# Patient Record
Sex: Male | Born: 1950 | Race: Black or African American | Hispanic: No | Smoking: Former smoker
Health system: Southern US, Community
[De-identification: ages and names within clinical notes are randomized; demographics above are authoritative.]

## PROBLEM LIST (undated history)

## (undated) DIAGNOSIS — I2510 Atherosclerotic heart disease of native coronary artery without angina pectoris: Secondary | ICD-10-CM

## (undated) DIAGNOSIS — E785 Hyperlipidemia, unspecified: Secondary | ICD-10-CM

## (undated) DIAGNOSIS — R21 Rash and other nonspecific skin eruption: Secondary | ICD-10-CM

## (undated) DIAGNOSIS — C61 Malignant neoplasm of prostate: Secondary | ICD-10-CM

## (undated) DIAGNOSIS — I219 Acute myocardial infarction, unspecified: Secondary | ICD-10-CM

## (undated) DIAGNOSIS — I255 Ischemic cardiomyopathy: Secondary | ICD-10-CM

## (undated) DIAGNOSIS — R350 Frequency of micturition: Secondary | ICD-10-CM

## (undated) DIAGNOSIS — K219 Gastro-esophageal reflux disease without esophagitis: Secondary | ICD-10-CM

## (undated) DIAGNOSIS — N529 Male erectile dysfunction, unspecified: Secondary | ICD-10-CM

## (undated) DIAGNOSIS — Z973 Presence of spectacles and contact lenses: Secondary | ICD-10-CM

## (undated) DIAGNOSIS — I10 Essential (primary) hypertension: Secondary | ICD-10-CM

## (undated) DIAGNOSIS — J189 Pneumonia, unspecified organism: Secondary | ICD-10-CM

## (undated) HISTORY — PX: NO PAST SURGERIES: SHX2092

## (undated) HISTORY — DX: Ischemic cardiomyopathy: I25.5

---

## 2015-03-07 ENCOUNTER — Other Ambulatory Visit: Payer: Self-pay | Admitting: Urology

## 2015-03-07 ENCOUNTER — Ambulatory Visit (INDEPENDENT_AMBULATORY_CARE_PROVIDER_SITE_OTHER): Payer: Managed Care, Other (non HMO) | Admitting: Urology

## 2015-03-07 DIAGNOSIS — N400 Enlarged prostate without lower urinary tract symptoms: Secondary | ICD-10-CM

## 2015-03-07 DIAGNOSIS — R972 Elevated prostate specific antigen [PSA]: Secondary | ICD-10-CM

## 2015-03-07 DIAGNOSIS — N5201 Erectile dysfunction due to arterial insufficiency: Secondary | ICD-10-CM

## 2015-04-21 ENCOUNTER — Other Ambulatory Visit: Payer: Self-pay | Admitting: Urology

## 2015-04-21 DIAGNOSIS — R972 Elevated prostate specific antigen [PSA]: Secondary | ICD-10-CM

## 2015-04-25 ENCOUNTER — Encounter (HOSPITAL_COMMUNITY): Payer: Self-pay

## 2015-04-25 ENCOUNTER — Ambulatory Visit (HOSPITAL_COMMUNITY)
Admission: RE | Admit: 2015-04-25 | Discharge: 2015-04-25 | Disposition: A | Discharge status: Home / Self Care | Payer: 59 | Source: Ambulatory Visit | Attending: Urology | Admitting: Urology

## 2015-04-25 DIAGNOSIS — R972 Elevated prostate specific antigen [PSA]: Secondary | ICD-10-CM | POA: Diagnosis present

## 2015-04-25 DIAGNOSIS — C61 Malignant neoplasm of prostate: Secondary | ICD-10-CM | POA: Insufficient documentation

## 2015-04-25 HISTORY — DX: Essential (primary) hypertension: I10

## 2015-04-25 MED ORDER — CEFTRIAXONE SODIUM 1 G IJ SOLR
1.0000 g | Freq: Once | INTRAMUSCULAR | Status: AC
Start: 2015-04-25 — End: 2015-04-25
  Administered 2015-04-25: 1 g via INTRAMUSCULAR

## 2015-04-25 MED ORDER — CEFTRIAXONE SODIUM 1 G IJ SOLR
INTRAMUSCULAR | Status: AC
  Administered 2015-04-25: 1 g via INTRAMUSCULAR
  Filled 2015-04-25: qty 10

## 2015-04-25 MED ORDER — LIDOCAINE HCL (PF) 2 % IJ SOLN
INTRAMUSCULAR | Status: AC
  Administered 2015-04-25: 10 mL
  Filled 2015-04-25: qty 10

## 2015-04-25 MED ORDER — LIDOCAINE HCL (PF) 1 % IJ SOLN
INTRAMUSCULAR | Status: AC
  Administered 2015-04-25: 2 mL
  Filled 2015-04-25: qty 5

## 2015-04-25 MED ORDER — LIDOCAINE HCL (PF) 2 % IJ SOLN
10.0000 mL | Freq: Once | INTRAMUSCULAR | Status: AC
  Administered 2015-04-25: 10 mL

## 2015-04-25 NOTE — Discharge Instructions (Signed)
Transrectal Ultrasound-Guided Biopsy °A transrectal ultrasound-guided biopsy is a procedure to remove samples of tissue from your prostate using ultrasound images to guide the procedure. The procedure is usually done to evaluate the prostate gland of men who have an elevated prostate-specific antigen (PSA). PSA is a blood test to screen for prostate cancer. The biopsy samples are taken to check for prostate cancer.  °LET YOUR HEALTH CARE PROVIDER KNOW ABOUT: °· Any allergies you have. °· All medicines you are taking, including vitamins, herbs, eye drops, creams, and over-the-counter medicines. °· Previous problems you or members of your family have had with the use of anesthetics. °· Any blood disorders you have. °· Previous surgeries you have had. °· Medical conditions you have. °RISKS AND COMPLICATIONS °Generally, this is a safe procedure. However, as with any procedure, problems can occur. Possible problems include: °· Infection of your prostate. °· Bleeding from your rectum or blood in your urine. °· Difficulty urinating. °· Nerve damage (this is usually temporary). °· Damage to surrounding structures such as blood vessels, organs, and muscles, which would require other procedures. °BEFORE THE PROCEDURE °· Do not eat or drink anything after midnight on the night before the procedure or as directed by your health care provider. °· Take medicines only as directed by your health care provider. °· Your health care provider may have you stop taking certain medicines 5-7 days before the procedure. °· You will be given an enema before the procedure. During an enema, a liquid is injected into your rectum to clear out waste. °· You may have lab tests the day of your procedure.   °· Plan to have someone take you home after the procedure. °PROCEDURE  °· You will be given medicine to help you relax (sedative) before the procedure. An IV tube will be inserted into one of your veins and used to give fluids and  medicine. °· You will be given antibiotic medicine to reduce the risk of an infection. °· You will be placed on your side for the procedure. °· A probe with lubricated gel will be placed into your rectum, and images will be taken of your prostate and surrounding structures. °· Numbing medicine will be injected into the prostate before the biopsy samples are taken. °· A biopsy needle will then be inserted and guided to your prostate with the use of the ultrasound images. °· Samples of prostate tissue will be taken, and the needle will then be removed. °· The biopsy samples will be sent to a lab to be analyzed. Results are usually back in 2-3 days. °AFTER THE PROCEDURE °· You will be taken to a recovery area where you will be monitored. °· You may have some discomfort in the rectal area. You will be given pain medicines to control this. °· You may be allowed to go home the same day, or you may need to stay in the hospital overnight. °Document Released: 01/28/2014 Document Reviewed: 05/02/2013 °ExitCare® Patient Information ©2015 [employer]. This information is not intended to replace advice given to you by your health care provider. Make sure you discuss any questions you have with your health care provider. ° °

## 2015-08-29 ENCOUNTER — Ambulatory Visit (INDEPENDENT_AMBULATORY_CARE_PROVIDER_SITE_OTHER): Payer: 59 | Admitting: Urology

## 2015-08-29 DIAGNOSIS — C61 Malignant neoplasm of prostate: Secondary | ICD-10-CM | POA: Diagnosis not present

## 2015-09-24 ENCOUNTER — Other Ambulatory Visit: Payer: Self-pay | Admitting: Urology

## 2015-09-24 ENCOUNTER — Other Ambulatory Visit: Payer: Self-pay | Admitting: Radiation Oncology

## 2015-09-24 ENCOUNTER — Telehealth: Payer: Self-pay | Admitting: *Deleted

## 2015-09-24 DIAGNOSIS — C61 Malignant neoplasm of prostate: Secondary | ICD-10-CM | POA: Insufficient documentation

## 2015-09-24 NOTE — Telephone Encounter (Signed)
Called patient to inform of implant date, no answer will call later. 

## 2015-09-24 NOTE — Telephone Encounter (Signed)
Called patient to inform of pre-seed appt. On 10-10-15 @ 1:45 pm with Dr. Tammi Klippel, spoke with patient and he is aware of this appt.

## 2015-10-09 ENCOUNTER — Telehealth: Payer: Self-pay | Admitting: *Deleted

## 2015-10-09 NOTE — Telephone Encounter (Signed)
Called patient to remind of appts., no answer will call later.

## 2015-10-10 ENCOUNTER — Ambulatory Visit
Admission: RE | Admit: 2015-10-10 | Discharge: 2015-10-10 | Disposition: A | Discharge status: Home / Self Care | Payer: BLUE CROSS/BLUE SHIELD | Source: Ambulatory Visit | Attending: Radiation Oncology | Admitting: Radiation Oncology

## 2015-10-10 DIAGNOSIS — Z510 Encounter for antineoplastic radiation therapy: Secondary | ICD-10-CM | POA: Diagnosis not present

## 2015-10-10 DIAGNOSIS — C61 Malignant neoplasm of prostate: Secondary | ICD-10-CM | POA: Diagnosis present

## 2015-10-10 NOTE — Progress Notes (Signed)
  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 Malignant neoplasm of prostate ([place]) [place]:  The patient presented today for evaluation for possible prostate seed implant. He was brought to the radiation planning suite and placed supine on the CT couch. A 3-dimensional image study set was obtained in upload to the planning computer. There, on each axial slice, I contoured the prostate gland. Then, using three-dimensional radiation planning tools I reconstructed the prostate in view of the structures from the transperineal needle pathway to assess for possible pubic arch interference. In doing so, I did not appreciate any pubic arch interference. Also, the patient's prostate volume was estimated based on the drawn structure. The volume was 73 cc.  Given the pubic arch appearance and prostate volume, patient remains a good candidate to proceed with prostate seed implant. Today, he freely provided informed written consent to proceed.    PLAN: The patient will undergo prostate seed implant.   ________________________________  Sheral Apley. Tammi Klippel, M.D.

## 2015-10-14 ENCOUNTER — Ambulatory Visit (HOSPITAL_BASED_OUTPATIENT_CLINIC_OR_DEPARTMENT_OTHER)
Admission: RE | Admit: 2015-10-14 | Discharge: 2015-10-14 | Disposition: A | Discharge status: Home / Self Care | Payer: BLUE CROSS/BLUE SHIELD | Source: Ambulatory Visit | Attending: Urology | Admitting: Urology

## 2015-10-14 ENCOUNTER — Encounter (HOSPITAL_BASED_OUTPATIENT_CLINIC_OR_DEPARTMENT_OTHER)
Admission: RE | Admit: 2015-10-14 | Discharge: 2015-10-14 | Disposition: A | Discharge status: Home / Self Care | Payer: BLUE CROSS/BLUE SHIELD | Source: Ambulatory Visit | Attending: Urology | Admitting: Urology

## 2015-10-14 DIAGNOSIS — C61 Malignant neoplasm of prostate: Secondary | ICD-10-CM | POA: Diagnosis not present

## 2015-10-16 ENCOUNTER — Emergency Department (HOSPITAL_COMMUNITY): Payer: BLUE CROSS/BLUE SHIELD

## 2015-10-16 ENCOUNTER — Emergency Department (HOSPITAL_COMMUNITY)
Admission: EM | Admit: 2015-10-16 | Discharge: 2015-10-16 | Disposition: A | Discharge status: Home / Self Care | Payer: BLUE CROSS/BLUE SHIELD | Attending: Emergency Medicine | Admitting: Emergency Medicine

## 2015-10-16 ENCOUNTER — Encounter (HOSPITAL_COMMUNITY): Payer: Self-pay

## 2015-10-16 DIAGNOSIS — K610 Anal abscess: Secondary | ICD-10-CM | POA: Insufficient documentation

## 2015-10-16 DIAGNOSIS — I10 Essential (primary) hypertension: Secondary | ICD-10-CM | POA: Insufficient documentation

## 2015-10-16 DIAGNOSIS — K5900 Constipation, unspecified: Secondary | ICD-10-CM | POA: Diagnosis present

## 2015-10-16 DIAGNOSIS — Z87891 Personal history of nicotine dependence: Secondary | ICD-10-CM | POA: Diagnosis not present

## 2015-10-16 MED ORDER — DOCUSATE SODIUM 100 MG PO CAPS
100.0000 mg | ORAL_CAPSULE | Freq: Two times a day (BID) | ORAL | Status: DC
Start: 2015-10-16 — End: 2016-02-09

## 2015-10-16 MED ORDER — LIDOCAINE-EPINEPHRINE (PF) 2 %-1:200000 IJ SOLN
10.0000 mL | Freq: Once | INTRAMUSCULAR | Status: AC
  Administered 2015-10-16: 10 mL
  Filled 2015-10-16: qty 20

## 2015-10-16 MED ORDER — OXYCODONE-ACETAMINOPHEN 5-325 MG PO TABS
1.0000 | ORAL_TABLET | Freq: Once | ORAL | Status: DC
  Filled 2015-10-16: qty 1

## 2015-10-16 MED ORDER — SULFAMETHOXAZOLE-TRIMETHOPRIM 800-160 MG PO TABS: 1.0000 | ORAL_TABLET | Freq: Two times a day (BID) | ORAL | Status: AC

## 2015-10-16 MED ORDER — POVIDONE-IODINE 10 % EX SOLN
CUTANEOUS | Status: AC
  Filled 2015-10-16: qty 118

## 2015-10-16 MED ORDER — OXYCODONE-ACETAMINOPHEN 5-325 MG PO TABS: 1.0000 | ORAL_TABLET | Freq: Four times a day (QID) | ORAL | Status: DC | PRN

## 2015-10-16 NOTE — ED Notes (Signed)
Dressing applied to anal area and cleaned with ns

## 2015-10-16 NOTE — ED Notes (Signed)
Patient given percocet package to go. Stated he didn't want one now due to him driving home

## 2015-10-16 NOTE — ED Provider Notes (Signed)
CSN: [id number]     Arrival date & time 10/16/15  0117 History   First MD Initiated Contact with Patient 10/16/15 0144     Chief Complaint  Patient presents with  . Constipated      (Consider location/radiation/quality/duration/timing/severity/associated sxs/prior Treatment) HPI  This is a 65 year old male with a history of hypertension who presents with rectal pain and constipation. Patient reports one-day history of worsening pressure around his rectum and feeling like he needs to have a bowel movement. He has taken a laxative without any success. Patient reports last normal bowel movement was on Tuesday. Denies any abdominal pain or vomiting. Denies any fevers or additional symptoms.  Past Medical History  Diagnosis Date  . Hypertension    History reviewed. No pertinent past surgical history. No family history on file. Social History  Substance Use Topics  . Smoking status: Former Research scientist (life sciences)  . Smokeless tobacco: None  . Alcohol Use: Yes    Review of Systems  Constitutional: Negative.  Negative for fever.  Respiratory: Negative.  Negative for shortness of breath.   Cardiovascular: Negative.  Negative for chest pain.  Gastrointestinal: Positive for constipation. Negative for nausea, vomiting, abdominal pain and diarrhea.  Genitourinary: Negative.   All other systems reviewed and are negative.     Allergies  Review of patient's allergies indicates no known allergies.  Home Medications   Prior to Admission medications   Medication Sig Start Date End Date Taking? Authorizing Provider  docusate sodium (COLACE) 100 MG capsule Take 1 capsule (100 mg total) by mouth every 12 (twelve) hours. 10/16/15   Merryl Hacker, MD  oxyCODONE-acetaminophen (PERCOCET/ROXICET) 5-325 MG tablet Take 1-2 tablets by mouth every 6 (six) hours as needed for severe pain. 10/16/15   Merryl Hacker, MD  sulfamethoxazole-trimethoprim (BACTRIM DS,SEPTRA DS) 800-160 MG tablet Take 1 tablet by mouth 2  (two) times daily. 10/16/15 10/23/15  Merryl Hacker, MD   BP 147/99 mmHg  Pulse 89  Temp(Src) 97.7 F (36.5 C) (Oral)  Resp 20  Ht 6\' 2"  (1.88 m)  Wt 205 lb (92.987 kg)  BMI 26.31 kg/m2  SpO2 98% Physical Exam  Constitutional: He is oriented to person, place, and time. He appears well-developed and well-nourished. No distress.  HENT:  Head: Normocephalic and atraumatic.  Cardiovascular: Normal rate, regular rhythm and normal heart sounds.   Pulmonary/Chest: Effort normal and breath sounds normal. No respiratory distress. He has no wheezes.  Abdominal: Soft. Bowel sounds are normal. There is no tenderness. There is no rebound.  Genitourinary:     Large palpable perianal abscess just right of the gluteal cleft posteriorly  Neurological: He is alert and oriented to person, place, and time.  Skin: Skin is warm and dry.  Psychiatric: He has a normal mood and affect.  Nursing note and vitals reviewed.   ED Course  .Marland KitchenIncision and Drainage Date/Time: 10/16/2015 3:53 AM Performed by: Merryl Hacker Authorized by: Merryl Hacker Consent: Verbal consent obtained. Consent given by: patient Required items: required blood products, implants, devices, and special equipment available Type: abscess Body area: anogenital Location details: perianal Anesthesia: local infiltration Local anesthetic: lidocaine 2% with epinephrine Anesthetic total: 5 ml Scalpel size: 11 Incision type: single straight Incision depth: subcutaneous Complexity: simple Drainage: purulent Drainage amount: copious Wound treatment: wound left open Packing material: 1/4 in iodoform gauze Patient tolerance: Patient tolerated the procedure well with no immediate complications   (including critical care time)  EMERGENCY DEPARTMENT US SOFT TISSUE INTERPRETATION "Study: Limited  Ultrasound of the noted body part in comments below"  INDICATIONS: Soft tissue infection Multiple views of the body part are  obtained with a multi-frequency linear probe  PERFORMED BY:  Myself  IMAGES ARCHIVED?: No  SIDE:Midline  BODY PART:Other soft tisse (comment in note)  FINDINGS: Abcess present  LIMITATIONS:  Emergent Procedure  INTERPRETATION:  Abcess present  COMMENT:  2.5 x 2.5 centimeter perianal abscess   Labs Review Labs Reviewed - No data to display  Imaging Review Dg Chest 2 View  10/14/2015  CLINICAL DATA:  Prostate cancer. EXAM: CHEST  2 VIEW COMPARISON:  None. FINDINGS: Normal heart size. Normal mediastinal contour. No pneumothorax. No pleural effusion. Lungs appear clear, with no acute consolidative airspace disease and no pulmonary edema. No gross sclerotic osseous lesions in the visualized chest. IMPRESSION: No active disease is seen in the chest. Electronically Signed   By: Ilona Sorrel M.D.   On: 10/14/2015 13:46   Dg Abd 1 View  10/16/2015  CLINICAL DATA:  Acute onset of rectal pain and mild lower abdominal pain. Took laxative without result. Initial encounter. EXAM: ABDOMEN - 1 VIEW COMPARISON:  None. FINDINGS: The visualized bowel gas pattern is unremarkable. Scattered air and stool filled loops of colon are seen; no abnormal dilatation of small bowel loops is seen to suggest small bowel obstruction. No free intra-abdominal air is identified, though evaluation for free air is limited on a single supine view. The visualized osseous structures are within normal limits; the sacroiliac joints are unremarkable in appearance. The visualized lung bases are essentially clear. IMPRESSION: Unremarkable bowel gas pattern; no free intra-abdominal air seen. Small to moderate amount of stool noted in the colon, mainly along the cecum and ascending colon, without significant radiographic evidence for constipation. Electronically Signed   By: Garald Balding M.D.   On: 10/16/2015 02:22   I have personally reviewed and evaluated these images and lab results as part of my medical decision-making.   EKG  Interpretation None      MDM   Final diagnoses:  Perianal abscess    Patient presents with pain and pressure around the rectum and constipation. He has a large 2.5 x 2 and half centimeter renal abscess. This was drained at the bedside without difficulty or complication. Patient reports immediate relief of pain. Does not appear to extend deep. Patient will need recheck in 2 days. It was packed. No overlying signs of cellulitis. However, it was copious and very foul smelling. For this reason, will place patient on Bactrim for 5 days. He was given PCP and general surgery follow-up.Will discharge with Colace as well and discuss with patient the importance of stool softners and other supportive measures.  After history, exam, and medical workup I feel the patient has been appropriately medically screened and is safe for discharge home. Pertinent diagnoses were discussed with the patient. Patient was given return precautions.     Merryl Hacker, MD 10/16/15 [phone]

## 2015-10-16 NOTE — ED Notes (Signed)
Pt states he took a laxative last evening and has not had any results.  Pt states he feels like he needs to have a bm but only having pain in coccyx area

## 2015-10-16 NOTE — Discharge Instructions (Signed)
You were seen today and found to have a perianal abscess. He need to follow-up for recheck in 2 days. He will also be given surgery follow-up. If he develops fever or any new or worsening symptoms you should be reevaluated.  Perianal Abscess An abscess is an infected area that contains a collection of pus and debris. A perianal abscess is one that occurs in the perineal area, which is the area between the anus and the scrotum in males and between the anus and the vagina in females. Perianal abscesses can vary in size. Without treatment, a perianal abscess can become larger and cause other problems. CAUSES  Glands in the perineal area can become plugged up with debris. When this happens, an abscess may form.  SIGNS AND SYMPTOMS  The most common symptoms of a perianal abscess are:  Swelling and redness in the area of the abscess. The redness may go beyond the abscess and appear as a red streak on the skin.  Pain in the area of the abscess. Other possible symptoms include:   A visible lump or a lump that can be felt when touching the area and is usually painful.  Bleeding or pus-like discharge from the area.  Fever.  General weakness. DIAGNOSIS  Your health care provider will take a medical history and examine the area. This may involve examining the rectal area with a gloved hand (digital rectal exam). For women, it may require a careful vaginal exam. Sometimes, the health care provider needs to look into the rectum using a probe or scope. TREATMENT  Treatment often requires making a cut (incision) in the abscess to drain the pus. This can sometimes be done in your health care provider's office or an emergency department after giving you medicine to numb the area (local anesthetic). For larger or deeper abscesses, surgery may be required to drain the abscess. Antibiotic medicines are sometimes given if there is infection of the surrounding tissue (cellulitis). In some cases, gauze is packed  into the abscess to continue draining the area. Frequent sitz baths may be recommended to help the wound heal and to reduce the chance of the abscess coming back. HOME CARE INSTRUCTIONS   Only take over-the-counter or prescription medicines for pain, fever, or discomfort as directed by your health care provider.  Take antibiotic medicine as directed. Make sure you finish it even if you start to feel better.  If gauze is used in the abscess, follow your health care provider's instructions for removing or changing the gauze. It can usually be removed in 2-3 days.  If one or more drains have been placed in the abscess cavity, be careful not to pull at them. Your health care provider will tell you how long they need to remain in place.  Take warm sitz baths 3-4 times a day and after bowel movements. This will help reduce pain and swelling.  Keep the skin around the abscess clean and dry. Avoid cleaning the area too much.  Avoid scratching the abscess area.  Avoid using colored or perfumed toilet papers. SEEK MEDICAL CARE IF:   You have trouble having a bowel movement or passing urine.  Your pain or swelling in the affected area does not seem to be improving.  The gauze packing or the drains come out before the planned time. SEEK IMMEDIATE MEDICAL CARE IF:   You have problems moving or using your legs.  You have severe or increasing pain.  Your swelling in the affected area suddenly gets  worse.  You have a large increase in bleeding or passing of pus.  You have chills or a fever. MAKE SURE YOU:   Understand these instructions.  Will watch your condition.  Will get help right away if you are not doing well or get worse.   This information is not intended to replace advice given to you by your health care provider. Make sure you discuss any questions you have with your health care provider.   Document Released: 10/20/2006 Document Revised: 07/04/2013 Document Reviewed:  04/25/2013 Elsevier Interactive Patient Education [employer].

## 2015-10-20 MED FILL — Oxycodone w/ Acetaminophen Tab 5-325 MG: ORAL | Qty: 6 | Status: AC

## 2015-11-11 ENCOUNTER — Telehealth: Payer: Self-pay | Admitting: *Deleted

## 2015-11-11 NOTE — Telephone Encounter (Signed)
CALLED PATIENT TO INFORM THAT IMPLANT HAS BEEN MOVED TO 01-01-16 @ 8:30 AM , NO ANSWER WILL CALL LATER

## 2015-11-12 ENCOUNTER — Telehealth: Payer: Self-pay | Admitting: *Deleted

## 2015-11-12 NOTE — Telephone Encounter (Signed)
CALLED PATIENT TO INFORM THAT IMPLANT HAS BEEN MOVED TO 01-01-16 @ 8:30 AM , SPOKE WITH PATIENT AND HE IS AWARE OF THIS AND IS GOOD WITH THIS DATE AND TIME

## 2015-12-15 SURGERY — Surgical Case
Anesthesia: *Unknown

## 2015-12-19 ENCOUNTER — Ambulatory Visit (INDEPENDENT_AMBULATORY_CARE_PROVIDER_SITE_OTHER): Payer: BLUE CROSS/BLUE SHIELD | Admitting: Urology

## 2015-12-19 DIAGNOSIS — C61 Malignant neoplasm of prostate: Secondary | ICD-10-CM | POA: Diagnosis not present

## 2015-12-24 ENCOUNTER — Telehealth: Payer: Self-pay | Admitting: *Deleted

## 2015-12-24 NOTE — Telephone Encounter (Signed)
CALLED PATIENT TO INFORM THAT IMPLANT HAS BEEN MOVED TO 02-19-16, LVM FOR A RETURN CALL

## 2015-12-30 ENCOUNTER — Telehealth: Payer: Self-pay | Admitting: *Deleted

## 2015-12-30 NOTE — Telephone Encounter (Signed)
Returned patient's phone call, lvm for a return call 

## 2016-02-11 ENCOUNTER — Telehealth: Payer: Self-pay | Admitting: *Deleted

## 2016-02-11 NOTE — Telephone Encounter (Signed)
CALLED PATIENT TO REMIND OF LABS FOR 02-12-16, LVM FOR A RETURN CALL

## 2016-02-12 ENCOUNTER — Ambulatory Visit: Admission: RE | Admit: 2016-02-12 | Payer: BLUE CROSS/BLUE SHIELD | Source: Ambulatory Visit

## 2016-02-12 ENCOUNTER — Encounter (HOSPITAL_BASED_OUTPATIENT_CLINIC_OR_DEPARTMENT_OTHER): Payer: Self-pay | Admitting: *Deleted

## 2016-02-12 DIAGNOSIS — Z79899 Other long term (current) drug therapy: Secondary | ICD-10-CM | POA: Diagnosis not present

## 2016-02-12 DIAGNOSIS — Z7982 Long term (current) use of aspirin: Secondary | ICD-10-CM | POA: Diagnosis not present

## 2016-02-12 DIAGNOSIS — C61 Malignant neoplasm of prostate: Secondary | ICD-10-CM | POA: Diagnosis present

## 2016-02-12 DIAGNOSIS — I10 Essential (primary) hypertension: Secondary | ICD-10-CM | POA: Diagnosis not present

## 2016-02-12 DIAGNOSIS — Z87891 Personal history of nicotine dependence: Secondary | ICD-10-CM | POA: Diagnosis not present

## 2016-02-12 LAB — CBC
HCT: 42.8 % (ref 39.0–52.0)
Hemoglobin: 14.3 g/dL (ref 13.0–17.0)
MCH: 29.9 pg (ref 26.0–34.0)
MCHC: 33.4 g/dL (ref 30.0–36.0)
MCV: 89.5 fL (ref 78.0–100.0)
Platelets: 218 10*3/uL (ref 150–400)
RBC: 4.78 MIL/uL (ref 4.22–5.81)
RDW: 13.7 % (ref 11.5–15.5)
WBC: 5.5 10*3/uL (ref 4.0–10.5)

## 2016-02-12 LAB — COMPREHENSIVE METABOLIC PANEL
ALBUMIN: 4.5 g/dL (ref 3.5–5.0)
ALT: 17 U/L (ref 17–63)
ANION GAP: 9 (ref 5–15)
AST: 19 U/L (ref 15–41)
Alkaline Phosphatase: 68 U/L (ref 38–126)
BILIRUBIN TOTAL: 0.7 mg/dL (ref 0.3–1.2)
BUN: 17 mg/dL (ref 6–20)
CO2: 28 mmol/L (ref 22–32)
Calcium: 9.4 mg/dL (ref 8.9–10.3)
Chloride: 104 mmol/L (ref 101–111)
Creatinine, Ser: 0.89 mg/dL (ref 0.61–1.24)
GFR calc non Af Amer: >60 mL/min (ref >60)
Glucose, Bld: 114 mg/dL — ABNORMAL HIGH (ref 65–99)
POTASSIUM: 3.9 mmol/L (ref 3.5–5.1)
Sodium: 141 mmol/L (ref 135–145)
TOTAL PROTEIN: 7.4 g/dL (ref 6.5–8.1)

## 2016-02-12 LAB — PROTIME-INR
INR: 0.97 (ref 0.00–1.49)
PROTHROMBIN TIME: 12.7 s (ref 11.6–15.2)

## 2016-02-12 LAB — APTT: aPTT: 29 seconds (ref 24–37)

## 2016-02-12 NOTE — Progress Notes (Addendum)
NPO AFTER MN.  ARRIVE AT 0900.  CURRENT LAB RESULTS , CXR, AND EKG IN CHART AND EPIC.  WILL DO FLEET ENEMA AM DOS .  PT TO CALL BACK TO COMPLETE MED. LIST AND WILL NEED INSTRUCTION'S ABOUT MEDS FOR DOS.  Pt called w/ medication list and updated in epic.  Pt will take norvasc am dos w/ sips of water.

## 2016-02-18 ENCOUNTER — Telehealth: Payer: Self-pay | Admitting: *Deleted

## 2016-02-18 NOTE — Progress Notes (Signed)
  Radiation Oncology         [phone] ________________________________  Name: Vi Kanan  MRN: [id number]  Date: 02/18/2016  DOB: 05-Dec-1950       Prostate Seed Implant  [id number] J., MD  No ref. provider found  DIAGNOSIS: 65 yo man with stage T1c adenocarcinoma of the prostate with Gleason 3+4 and PSA 8.2    ICD-9-CM ICD-10-CM   1. Prostate cancer ([place]) 185 C61 DG Chest 2 View     DG Chest 2 View    PROCEDURE: Insertion of radioactive I-125 seeds into the prostate gland.  RADIATION DOSE: 145 Gy, definitive therapy.  TECHNIQUE: Levorn Elsmore was brought to the operating room with the urologist. He was placed in the dorsolithotomy position. He was catheterized and a rectal tube was inserted. The perineum was shaved, prepped and draped. The ultrasound probe was then introduced into the rectum to see the prostate gland.  TREATMENT DEVICE: A needle grid was attached to the ultrasound probe stand and anchor needles were placed.  3D PLANNING: The prostate was imaged in 3D using a sagittal sweep of the prostate probe. These images were transferred to the planning computer. There, the prostate, urethra and rectum were defined on each axial reconstructed image. Then, the software created an optimized 3D plan and a few seed positions were adjusted. The quality of the plan was reviewed using [facility] information for the target and the following two organs at risk:  Urethra and Rectum.  Then the accepted plan was uploaded to the seed Selectron afterloading unit.  PROSTATE VOLUME STUDY:  Using transrectal ultrasound the volume of the prostate was verified to be 73 cc.  SPECIAL TREATMENT PROCEDURE/SUPERVISION AND HANDLING: The Nucletron FIRST system was used to place the needles under sagittal guidance. A total of 23 needles were used to deposit 78 seeds in the prostate gland. The individual seed activity was 50.232 mCi.  COMPLEX SIMULATION: At the end of the procedure, an anterior radiograph  of the pelvis was obtained to document seed positioning and count. Cystoscopy was performed to check the urethra and bladder.  MICRODOSIMETRY: At the end of the procedure, the patient was emitting 0.32 mR/hr at 1 meter. Accordingly, he was considered safe for hospital discharge.  PLAN: The patient will return to the radiation oncology clinic for post implant CT dosimetry in three weeks.   ________________________________  Sheral Apley Tammi Klippel, M.D.

## 2016-02-18 NOTE — H&P (Signed)
  Chief Complaint  Preop visit for prostate cancer   Active Problems  1. Benign prostatic hypertrophy without lower urinary tract symptoms (N40.0)  2. Elevated prostate specific antigen (PSA) (R97.20)  3. 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 Prostate cancer (C61)  History of Present Illness  Mr. Nagle returns today in f/u for his history of prostate cancer.  He is scheduled for a seed implant on 01/01/16.  He is voiding well without new complaints.  He has occasional intermittency.  He has no new complaints or problems.  He had a prostate biopsy for a PSA of 6.94 with a volume of 2ml and was found to have a T1c Nx Mx Gleason 7(3+4) prostate cancer in 1 core at the left base.   He has an IPSS of 9 and a SHIM of 13.  His [place] score is 3. with LNI probability of only 1.4%.   Past Medical History  1. History of hypercholesterolemia (Z86.39)  2. History of hypertension (Z86.79)  Surgical History  1. History of No Surgical Problems  Current Meds  1. AmLODIPine Besylate 5 MG Oral Tablet;  Therapy: (Recorded:10Jun2016) to Recorded  2. Aspirin 81 MG TABS;  Therapy: (Recorded:10Jun2016) to Recorded  3. Multiple Vitamin TABS;  Therapy: (Recorded:10Jun2016) to Recorded  4. Potassium TABS;  Therapy: (Recorded:02Dec2016) to Recorded  5. Triamterene-HCTZ 37.5-25 MG Oral Capsule;  Therapy: (Recorded:10Jun2016) to Recorded  Allergies  1. No Known Drug Allergies  Social History  1. Alcohol use (Z78.9)  2. Caffeine use (F15.90)  3. Number of children   2 sons  4. Occupation   Administrator.  5. Tobacco use (Z72.0)   1 pack for 40 years. quit 4 months ago.  6. Widowed   Past and social history reviewed and updated.  He is not working currently.   Review of Systems Genitourinary, constitutional, skin, eye, otolaryngeal, hematologic/lymphatic, cardiovascular, pulmonary, endocrine, musculoskeletal, gastrointestinal, neurological and psychiatric system(s) were  reviewed and pertinent findings if present are noted and are otherwise negative.  Genitourinary: no hematuria.  Gastrointestinal: no diarrhea and no constipation.  Cardiovascular: no chest pain.  Respiratory: no shortness of breath.    Vitals Vital Signs [Data Includes: Last 1 Day]  Recorded: [id number] 11:02AM  Blood Pressure: 165 / 96 Temperature: 97.7 F Heart Rate: 80  Physical Exam Constitutional: Well nourished and well developed . No acute distress.  Pulmonary: No respiratory distress and normal respiratory rhythm and effort.  Cardiovascular: Heart rate and rhythm are normal . No peripheral edema.    Results/Data  He will get a UA today.    Assessment  1. Prostate cancer (C61)   T1c Gleason 7(3+4) prostate cancer.   Plan Prostate cancer   1. UA With REFLEX; [Do Not Release]; Status:Hold For - [employer];  Requested for:22Mar2017;    We will proceed with the seed implant as scheduled.   He had no additional questions or concerns.   Discussion/Summary  CC: Dr. Micheline Rough.

## 2016-02-18 NOTE — Telephone Encounter (Signed)
CALLED PATIENT TO REMIND OF IMPLANT FOR 02-19-16, SPOKE WITH PATIENT AND HE IS AWARE OF THIS IMPLANT.

## 2016-02-19 ENCOUNTER — Ambulatory Visit (HOSPITAL_COMMUNITY): Payer: BLUE CROSS/BLUE SHIELD

## 2016-02-19 ENCOUNTER — Ambulatory Visit (HOSPITAL_BASED_OUTPATIENT_CLINIC_OR_DEPARTMENT_OTHER): Payer: BLUE CROSS/BLUE SHIELD | Admitting: Anesthesiology

## 2016-02-19 ENCOUNTER — Encounter (HOSPITAL_BASED_OUTPATIENT_CLINIC_OR_DEPARTMENT_OTHER)
Admission: RE | Disposition: A | Discharge status: Home / Self Care | Payer: Self-pay | Source: Ambulatory Visit | Attending: Urology

## 2016-02-19 ENCOUNTER — Ambulatory Visit (HOSPITAL_BASED_OUTPATIENT_CLINIC_OR_DEPARTMENT_OTHER)
Admission: RE | Admit: 2016-02-19 | Discharge: 2016-02-19 | Disposition: A | Discharge status: Home / Self Care | Payer: BLUE CROSS/BLUE SHIELD | Source: Ambulatory Visit | Attending: Urology | Admitting: Urology

## 2016-02-19 ENCOUNTER — Encounter (HOSPITAL_BASED_OUTPATIENT_CLINIC_OR_DEPARTMENT_OTHER): Payer: Self-pay | Admitting: *Deleted

## 2016-02-19 DIAGNOSIS — Z87891 Personal history of nicotine dependence: Secondary | ICD-10-CM | POA: Insufficient documentation

## 2016-02-19 DIAGNOSIS — Z7982 Long term (current) use of aspirin: Secondary | ICD-10-CM | POA: Insufficient documentation

## 2016-02-19 DIAGNOSIS — C61 Malignant neoplasm of prostate: Secondary | ICD-10-CM

## 2016-02-19 DIAGNOSIS — Z79899 Other long term (current) drug therapy: Secondary | ICD-10-CM | POA: Insufficient documentation

## 2016-02-19 DIAGNOSIS — I10 Essential (primary) hypertension: Secondary | ICD-10-CM | POA: Insufficient documentation

## 2016-02-19 HISTORY — DX: Male erectile dysfunction, unspecified: N52.9

## 2016-02-19 HISTORY — DX: Gastro-esophageal reflux disease without esophagitis: K21.9

## 2016-02-19 HISTORY — DX: Malignant neoplasm of prostate: C61

## 2016-02-19 HISTORY — DX: Rash and other nonspecific skin eruption: R21

## 2016-02-19 HISTORY — PX: RADIOACTIVE SEED IMPLANT: SHX5150

## 2016-02-19 HISTORY — DX: Presence of spectacles and contact lenses: Z97.3

## 2016-02-19 HISTORY — DX: Frequency of micturition: R35.0

## 2016-02-19 HISTORY — DX: Hyperlipidemia, unspecified: E78.5

## 2016-02-19 HISTORY — PX: CYSTOSCOPY: SHX5120

## 2016-02-19 SURGERY — INSERTION, RADIATION SOURCE, PROSTATE
Anesthesia: General

## 2016-02-19 MED ORDER — ACETAMINOPHEN 325 MG PO TABS
650.0000 mg | ORAL_TABLET | ORAL | Status: DC | PRN
  Filled 2016-02-19: qty 2

## 2016-02-19 MED ORDER — FENTANYL CITRATE (PF) 100 MCG/2ML IJ SOLN
INTRAMUSCULAR | Status: DC | PRN
  Administered 2016-02-19 (×4): 25 ug via INTRAVENOUS
  Administered 2016-02-19: 50 ug via INTRAVENOUS

## 2016-02-19 MED ORDER — DEXAMETHASONE SODIUM PHOSPHATE 4 MG/ML IJ SOLN
INTRAMUSCULAR | Status: DC | PRN
  Administered 2016-02-19: 10 mg via INTRAVENOUS

## 2016-02-19 MED ORDER — OXYCODONE HCL 5 MG PO TABS
5.0000 mg | ORAL_TABLET | ORAL | Status: DC | PRN
  Filled 2016-02-19: qty 2

## 2016-02-19 MED ORDER — MIDAZOLAM HCL 2 MG/2ML IJ SOLN
INTRAMUSCULAR | Status: AC
  Filled 2016-02-19: qty 2

## 2016-02-19 MED ORDER — LIDOCAINE HCL (CARDIAC) 20 MG/ML IV SOLN
INTRAVENOUS | Status: DC | PRN
  Administered 2016-02-19: 100 mg via INTRAVENOUS

## 2016-02-19 MED ORDER — SODIUM CHLORIDE 0.9 % IR SOLN
Status: DC | PRN
  Administered 2016-02-19: 1000 mL via INTRAVESICAL

## 2016-02-19 MED ORDER — HYDROCODONE-ACETAMINOPHEN 5-325 MG PO TABS: 1.0000 | ORAL_TABLET | Freq: Four times a day (QID) | ORAL | Status: DC | PRN

## 2016-02-19 MED ORDER — DEXAMETHASONE SODIUM PHOSPHATE 10 MG/ML IJ SOLN
INTRAMUSCULAR | Status: AC
  Filled 2016-02-19: qty 1

## 2016-02-19 MED ORDER — FENTANYL CITRATE (PF) 100 MCG/2ML IJ SOLN
INTRAMUSCULAR | Status: AC
  Filled 2016-02-19: qty 2

## 2016-02-19 MED ORDER — TAMSULOSIN HCL 0.4 MG PO CAPS: 0.4000 mg | ORAL_CAPSULE | Freq: Every day | ORAL | Status: AC

## 2016-02-19 MED ORDER — TAMSULOSIN HCL 0.4 MG PO CAPS
ORAL_CAPSULE | ORAL | Status: AC
  Filled 2016-02-19: qty 1

## 2016-02-19 MED ORDER — PROPOFOL 10 MG/ML IV BOLUS
INTRAVENOUS | Status: DC | PRN
  Administered 2016-02-19: 160 mg via INTRAVENOUS
  Administered 2016-02-19: 50 mg via INTRAVENOUS
  Administered 2016-02-19: 20 mg via INTRAVENOUS

## 2016-02-19 MED ORDER — EPHEDRINE SULFATE 50 MG/ML IJ SOLN
INTRAMUSCULAR | Status: DC | PRN
  Administered 2016-02-19 (×5): 10 mg via INTRAVENOUS

## 2016-02-19 MED ORDER — CIPROFLOXACIN HCL 500 MG PO TABS: 500.0000 mg | ORAL_TABLET | Freq: Two times a day (BID) | ORAL | Status: DC

## 2016-02-19 MED ORDER — SODIUM CHLORIDE 0.9% FLUSH
3.0000 mL | Freq: Two times a day (BID) | INTRAVENOUS | Status: DC
  Filled 2016-02-19: qty 3

## 2016-02-19 MED ORDER — MIDAZOLAM HCL 5 MG/5ML IJ SOLN
INTRAMUSCULAR | Status: DC | PRN
  Administered 2016-02-19: 2 mg via INTRAVENOUS

## 2016-02-19 MED ORDER — FENTANYL CITRATE (PF) 100 MCG/2ML IJ SOLN
25.0000 ug | INTRAMUSCULAR | Status: DC | PRN
  Filled 2016-02-19: qty 1

## 2016-02-19 MED ORDER — STERILE WATER FOR IRRIGATION IR SOLN
Status: DC | PRN
  Administered 2016-02-19: 500 mL

## 2016-02-19 MED ORDER — DIATRIZOATE MEGLUMINE 30 % UR SOLN
URETHRAL | Status: DC | PRN
  Administered 2016-02-19: 7 mL via URETHRAL

## 2016-02-19 MED ORDER — TAMSULOSIN HCL 0.4 MG PO CAPS
0.4000 mg | ORAL_CAPSULE | Freq: Every day | ORAL | Status: DC
  Administered 2016-02-19: 0.4 mg via ORAL
  Filled 2016-02-19: qty 1

## 2016-02-19 MED ORDER — KETOROLAC TROMETHAMINE 30 MG/ML IJ SOLN
INTRAMUSCULAR | Status: DC | PRN
  Administered 2016-02-19: 30 mg via INTRAVENOUS

## 2016-02-19 MED ORDER — PROMETHAZINE HCL 25 MG/ML IJ SOLN
6.2500 mg | INTRAMUSCULAR | Status: DC | PRN
  Filled 2016-02-19: qty 1

## 2016-02-19 MED ORDER — ACETAMINOPHEN 650 MG RE SUPP
650.0000 mg | RECTAL | Status: DC | PRN
  Filled 2016-02-19: qty 1

## 2016-02-19 MED ORDER — FLEET ENEMA 7-19 GM/118ML RE ENEM
1.0000 | ENEMA | Freq: Once | RECTAL | Status: AC
  Administered 2016-02-19: 1 via RECTAL
  Filled 2016-02-19: qty 1

## 2016-02-19 MED ORDER — KETOROLAC TROMETHAMINE 30 MG/ML IJ SOLN
INTRAMUSCULAR | Status: AC
  Filled 2016-02-19: qty 1

## 2016-02-19 MED ORDER — ONDANSETRON HCL 4 MG/2ML IJ SOLN
INTRAMUSCULAR | Status: AC
  Filled 2016-02-19: qty 2

## 2016-02-19 MED ORDER — CIPROFLOXACIN IN D5W 400 MG/200ML IV SOLN
400.0000 mg | INTRAVENOUS | Status: AC
  Administered 2016-02-19: 400 mg via INTRAVENOUS
  Filled 2016-02-19: qty 200

## 2016-02-19 MED ORDER — EPHEDRINE SULFATE 50 MG/ML IJ SOLN
INTRAMUSCULAR | Status: AC
  Filled 2016-02-19: qty 1

## 2016-02-19 MED ORDER — LIDOCAINE HCL (CARDIAC) 20 MG/ML IV SOLN
INTRAVENOUS | Status: AC
  Filled 2016-02-19: qty 5

## 2016-02-19 MED ORDER — ONDANSETRON HCL 4 MG/2ML IJ SOLN
INTRAMUSCULAR | Status: DC | PRN
  Administered 2016-02-19: 4 mg via INTRAVENOUS

## 2016-02-19 MED ORDER — SODIUM CHLORIDE 0.9% FLUSH
3.0000 mL | INTRAVENOUS | Status: DC | PRN
  Filled 2016-02-19: qty 3

## 2016-02-19 MED ORDER — LACTATED RINGERS IV SOLN
INTRAVENOUS | Status: DC
  Administered 2016-02-19 (×3): via INTRAVENOUS
  Filled 2016-02-19: qty 1000

## 2016-02-19 MED ORDER — PROPOFOL 500 MG/50ML IV EMUL
INTRAVENOUS | Status: AC
  Filled 2016-02-19: qty 50

## 2016-02-19 MED ORDER — CIPROFLOXACIN IN D5W 400 MG/200ML IV SOLN
INTRAVENOUS | Status: AC
  Filled 2016-02-19: qty 200

## 2016-02-19 MED ORDER — SODIUM CHLORIDE 0.9 % IV SOLN
250.0000 mL | INTRAVENOUS | Status: DC | PRN
  Filled 2016-02-19: qty 250

## 2016-02-19 SURGICAL SUPPLY — 32 items
BAG URINE DRAINAGE (UROLOGICAL SUPPLIES) ×4 IMPLANT
BLADE CLIPPER SURG (BLADE) ×4 IMPLANT
CATH FOLEY 2WAY SLVR  5CC 16FR (CATHETERS) ×2
CATH FOLEY 2WAY SLVR  5CC 18FR (CATHETERS) ×2
CATH FOLEY 2WAY SLVR 5CC 16FR (CATHETERS) ×2 IMPLANT
CATH FOLEY 2WAY SLVR 5CC 18FR (CATHETERS) ×2 IMPLANT
CATH ROBINSON RED A/P 20FR (CATHETERS) ×4 IMPLANT
CLOTH BEACON ORANGE TIMEOUT ST (SAFETY) ×4 IMPLANT
COVER BACK TABLE 60X90IN (DRAPES) ×4 IMPLANT
COVER MAYO STAND STRL (DRAPES) ×4 IMPLANT
DRSG TEGADERM 4X4.75 (GAUZE/BANDAGES/DRESSINGS) ×4 IMPLANT
DRSG TEGADERM 8X12 (GAUZE/BANDAGES/DRESSINGS) ×4 IMPLANT
GLOVE BIO SURGEON STRL SZ7.5 (GLOVE) IMPLANT
GLOVE ECLIPSE 8.0 STRL XLNG CF (GLOVE) ×16 IMPLANT
GLOVE SURG SS PI 8.0 STRL IVOR (GLOVE) ×8 IMPLANT
GOWN STRL REUS W/ TWL LRG LVL3 (GOWN DISPOSABLE) ×2 IMPLANT
GOWN STRL REUS W/ TWL XL LVL3 (GOWN DISPOSABLE) ×2 IMPLANT
GOWN STRL REUS W/TWL LRG LVL3 (GOWN DISPOSABLE) ×2
GOWN STRL REUS W/TWL XL LVL3 (GOWN DISPOSABLE) ×2
HOLDER FOLEY CATH W/STRAP (MISCELLANEOUS) IMPLANT
IV NS 1000ML (IV SOLUTION) ×2
IV NS 1000ML BAXH (IV SOLUTION) ×2 IMPLANT
KIT ROOM TURNOVER WOR (KITS) ×4 IMPLANT
PACK CYSTO (CUSTOM PROCEDURE TRAY) ×4 IMPLANT
SYRINGE 10CC LL (SYRINGE) ×4 IMPLANT
SYRINGE IRR TOOMEY STRL 70CC (SYRINGE) ×4 IMPLANT
TUBE CONNECTING 12'X1/4 (SUCTIONS)
TUBE CONNECTING 12X1/4 (SUCTIONS) IMPLANT
UNDERPAD 30X30 INCONTINENT (UNDERPADS AND DIAPERS) ×8 IMPLANT
WATER STERILE IRR 3000ML UROMA (IV SOLUTION) IMPLANT
WATER STERILE IRR 500ML POUR (IV SOLUTION) ×4 IMPLANT
selectSeed 123 ×312 IMPLANT

## 2016-02-19 NOTE — Transfer of Care (Signed)
  Last Vitals:  Filed Vitals:   02/19/16 0748  BP: 161/91  Pulse: 89  Temp: 36.4 C  Resp: 16    Last Pain: There were no vitals filed for this visit.    Patients Stated Pain Goal: 4 (02/19/16 0753) Immediate Anesthesia Transfer of Care Note  Patient: Tyler Chavez  Procedure(s) Performed: Procedure(s) (LRB): RADIOACTIVE SEED IMPLANT/BRACHYTHERAPY IMPLANT (N/A) CYSTOSCOPY  Patient Location: PACU  Anesthesia Type: General  Level of Consciousness: awake, alert  and oriented  Airway & Oxygen Therapy: Patient Spontanous Breathing and Patient connected to face mask oxygen  Post-op Assessment: Report given to PACU RN and Post -op Vital signs reviewed and stable  Post vital signs: Reviewed and stable  Complications: No apparent anesthesia complications

## 2016-02-19 NOTE — Discharge Instructions (Addendum)
°Brachytherapy for Prostate Cancer, Care After °Refer to this sheet in the next few weeks. These instructions provide you with information on caring for yourself after your procedure. Your health care provider may also give you more specific instructions. Your treatment has been planned according to current medical practices, but problems sometimes occur. Call your health care provider if you have any problems or questions after your procedure. °WHAT TO EXPECT AFTER THE PROCEDURE °The area behind the scrotum will probably be tender and bruised. For a short period of time you may have: °· Difficulty passing urine. You may need a catheter for a few days to a month. °· Blood in the urine or semen. °· A feeling of constipation because of prostate swelling. °· Frequent feeling of an urgent need to urinate. °For a long period of time you may have: °· Inflammation of the rectum. This happens in about 2% of people who have the procedure. °· Erection problems. These vary with age and occur in about 15-40% of men. °· Difficulty urinating. This is caused by scarring in the urethra. °· Diarrhea. °HOME CARE INSTRUCTIONS  °· Take medicines only as directed by your health care provider. °· You will probably have a catheter in your bladder for several days. You will have blood in the urine bag and should drink a lot of fluids to keep it a light red color. °· Keep all follow-up visits as directed by your health care provider. If you have a catheter, it will be removed during one of these visits. °· Try not to sit directly on the area behind the scrotum. A soft cushion can decrease the discomfort. Ice packs may also be helpful for the discomfort. Do not put ice directly on the skin. °· Shower and wash the area behind the scrotum gently. Do not sit in a tub. °· If you have had the brachytherapy that uses the seeds, limit your close contact with children and pregnant women for 2 months because of the radiation still in the prostate.  After that period of time, the levels drop off quickly. °SEEK IMMEDIATE MEDICAL CARE IF:  °· You have a fever. °· You have chills. °· You have shortness of breath. °· You have chest pain. °· You have thick blood, like tomato juice, in the urine bag. °· Your catheter is blocked so urine cannot get into the bag. Your bladder area or lower abdomen may be swollen. °· There is excessive bleeding from your rectum. It is normal to have a little blood mixed with your stool. °· There is severe discomfort in the treated area that does not go away with pain medicine. °· You have abdominal discomfort. °· You have severe nausea or vomiting. °· You develop any new or unusual symptoms. °  °This information is not intended to replace advice given to you by your health care provider. Make sure you discuss any questions you have with your health care provider. °  °Document Released: 10/16/2010 Document Revised: 10/04/2014 Document Reviewed: 03/06/2013 °Elsevier Interactive Patient Education ©2016 [employer] ° ° °Post Anesthesia Home Care Instructions ° °Activity: °Get plenty of rest for the remainder of the day. A responsible adult should stay with you for 24 hours following the procedure.  °For the next 24 hours, DO NOT: °-Drive a car °-Operate machinery °-Drink alcoholic beverages °-Take any medication unless instructed by your physician °-Make any legal decisions or sign important papers. ° °Meals: °Start with liquid foods such as gelatin or soup. Progress to regular   foods as tolerated. Avoid greasy, spicy, heavy foods. If nausea and/or vomiting occur, drink only clear liquids until the nausea and/or vomiting subsides. Call your physician if vomiting continues. ° °Special Instructions/Symptoms: °Your throat may feel dry or sore from the anesthesia or the breathing tube placed in your throat during surgery. If this causes discomfort, gargle with warm salt water. The discomfort should disappear within 24 hours. ° °If you had a  scopolamine patch placed behind your ear for the management of post- operative nausea and/or vomiting: ° °1. The medication in the patch is effective for 72 hours, after which it should be removed.  Wrap patch in a tissue and discard in the trash. Wash hands thoroughly with soap and water. °2. You may remove the patch earlier than 72 hours if you experience unpleasant side effects which may include dry mouth, dizziness or visual disturbances. °3. Avoid touching the patch. Wash your hands with soap and water after contact with the patch. °  ° °

## 2016-02-19 NOTE — Anesthesia Postprocedure Evaluation (Signed)
Anesthesia Post Note  Patient: Kanai Debolt  Procedure(s) Performed: Procedure(s) (LRB): RADIOACTIVE SEED IMPLANT/BRACHYTHERAPY IMPLANT (N/A) CYSTOSCOPY  Patient location during evaluation: PACU Anesthesia Type: General Level of consciousness: awake and alert Pain management: pain level controlled Vital Signs Assessment: post-procedure vital signs reviewed and stable Respiratory status: spontaneous breathing, nonlabored ventilation, respiratory function stable and patient connected to nasal cannula oxygen Cardiovascular status: blood pressure returned to baseline and stable Postop Assessment: no signs of nausea or vomiting Anesthetic complications: no    Last Vitals:  Filed Vitals:   02/19/16 1245 02/19/16 1334  BP: 128/101 160/89  Pulse: 87 89  Temp:  36.6 C  Resp: 27 16    Last Pain:  Filed Vitals:   02/19/16 1357  PainSc: 0-No pain                 Camara Rosander J

## 2016-02-19 NOTE — Anesthesia Preprocedure Evaluation (Addendum)
Anesthesia Evaluation  Patient identified by MRN, date of birth, ID band Patient awake    Reviewed: Allergy & Precautions, NPO status , Patient's Chart, lab work & pertinent test results  Airway Mallampati: II  TM Distance: >3 FB Neck ROM: Full    Dental  (+) Poor Dentition,  Missing many teeth. :   Pulmonary former smoker,    Pulmonary exam normal breath sounds clear to auscultation       Cardiovascular hypertension, negative cardio ROS Normal cardiovascular exam Rhythm:Regular Rate:Normal     Neuro/Psych negative neurological ROS  negative psych ROS   GI/Hepatic Neg liver ROS, GERD  ,  Endo/Other  negative endocrine ROS  Renal/GU negative Renal ROS  negative genitourinary   Musculoskeletal negative musculoskeletal ROS (+)   Abdominal   Peds negative pediatric ROS (+)  Hematology negative hematology ROS (+)   Anesthesia Other Findings   Reproductive/Obstetrics negative OB ROS                            Anesthesia Physical Anesthesia Plan  ASA: II  Anesthesia Plan: General   Post-op Pain Management:    Induction: Intravenous  Airway Management Planned: LMA  Additional Equipment:   Intra-op Plan:   Post-operative Plan: Extubation in OR  Informed Consent: I have reviewed the patients History and Physical, chart, labs and discussed the procedure including the risks, benefits and alternatives for the proposed anesthesia with the patient or authorized representative who has indicated his/her understanding and acceptance.   Dental advisory given  Plan Discussed with: CRNA  Anesthesia Plan Comments:         Anesthesia Quick Evaluation

## 2016-02-19 NOTE — Op Note (Signed)
PATIENT:  Tyler Chavez  PRE-OPERATIVE DIAGNOSIS:  Adenocarcinoma of the prostate  POST-OPERATIVE DIAGNOSIS:  Same  PROCEDURE:  Procedure(s): 1. I-125 radioactive seed implantation 2. Cystoscopy  SURGEON:  Surgeon(s): Irine Seal MD  Radiation oncologist: Dr. Tyler Pita  ANESTHESIA:  General  EBL:  Minimal  DRAINS: 69 French Foley catheter  INDICATION: [name] is a 65 y.o. with Stage T1c , Gleason 7 prostate cancer who has elected brachytherapy for treatment.  Description of procedure: After informed consent the patient was brought to the major OR, placed on the table and administered general anesthesia. He was then moved to the modified lithotomy position with his perineum perpendicular to the floor. His perineum and genitalia were then sterilely prepped. An official timeout was then performed. A 16 French Foley catheter was then placed in the bladder and filled with dilute contrast, a rectal tube was placed in the rectum and the transrectal ultrasound probe was placed in the rectum and affixed to the stand. He was then sterilely draped.  The sterile grid was installed.   Anchor needles were then placed.   Real time ultrasonography was used along with the seed planning software spot-pro version 3.1-00. This was used to develop the seed plan including the number of needles as well as number of seeds required for complete and adequate coverage. Real-time ultrasonography was then used along with the previously developed plan and the Nucletron device to implant a total of 78 seeds using 23 needles for a target dose of 145 Gy. This proceeded without difficulty or complication.  A Foley catheter was then removed as well as the transrectal ultrasound probe and rectal probe. Flexible cystoscopy was then performed using the 17 French flexible scope which revealed a normal urethra throughout its length down to the sphincter which appeared intact. The prostatic urethra was 3cm with bilobar  hyperplasia with some obstruction. The bladder was then entered and fully and systematically inspected.  The ureteral orifices were noted to be of normal configuration and position. The mucosa revealed no evidence of tumors. There were also no stones identified within the bladder.  No seeds or spacers were seen and/or removed from the bladder but he did have a fresh clot that was irrigated out of the bladder with a fresh 39fr foley which was removed prior to reversal of anesthetic.  The cystoscope was then removed.  The drapes were removed.  The perineum was cleaned and dressed.  He was taken out of the lithotomy position and was awakened and taken to recovery room in stable and satisfactory condition. He tolerated procedure well and there were no intraoperative complications.

## 2016-02-19 NOTE — Anesthesia Procedure Notes (Signed)
Procedure Name: LMA Insertion Date/Time: 02/19/2016 9:39 AM Performed by: Mechele Claude Pre-anesthesia Checklist: Patient identified, Emergency Drugs available, Suction available and Patient being monitored Patient Re-evaluated:Patient Re-evaluated prior to inductionOxygen Delivery Method: Circle System Utilized Preoxygenation: Pre-oxygenation with 100% oxygen Intubation Type: IV induction Ventilation: Mask ventilation without difficulty LMA: LMA inserted LMA Size: 5.0 Number of attempts: 2 Placement Confirmation: positive ETCO2 Tube secured with: Tape Dental Injury: Teeth and Oropharynx as per pre-operative assessment  Comments: LMA not seating well after first insertion. Increased depth of anesthesia, reinserted 5 LMA with good placement.  VSS. Dr. Delma Post present.

## 2016-02-19 NOTE — Interval H&P Note (Signed)
History and Physical Interval Note:  02/19/2016 8:17 AM  Tyler Chavez  has presented today for surgery, with the diagnosis of PROSTATE CANCER  The various methods of treatment have been discussed with the patient and family. After consideration of risks, benefits and other options for treatment, the patient has consented to  Procedure(s): RADIOACTIVE SEED IMPLANT/BRACHYTHERAPY IMPLANT (N/A) as a surgical intervention .  The patient's history has been reviewed, patient examined, no change in status, stable for surgery.  I have reviewed the patient's chart and labs.  Questions were answered to the patient's satisfaction.     Shatha Hooser J

## 2016-02-20 ENCOUNTER — Encounter (HOSPITAL_BASED_OUTPATIENT_CLINIC_OR_DEPARTMENT_OTHER): Payer: Self-pay | Admitting: Urology

## 2016-03-17 ENCOUNTER — Telehealth: Payer: Self-pay | Admitting: *Deleted

## 2016-03-17 NOTE — Telephone Encounter (Signed)
CALLED PATIENT TO REMIND OF APPTS. FOR 03-18-16, LVM FOR A RETURN CALL

## 2016-03-18 ENCOUNTER — Ambulatory Visit
Admit: 2016-03-18 | Discharge: 2016-03-18 | Disposition: A | Discharge status: Home / Self Care | Payer: BLUE CROSS/BLUE SHIELD | Attending: Radiation Oncology | Admitting: Radiation Oncology

## 2016-03-18 ENCOUNTER — Encounter: Payer: Self-pay | Admitting: Radiation Oncology

## 2016-03-18 VITALS — BP 156/92 | HR 86 | Resp 16 | Wt 209.2 lb

## 2016-03-18 DIAGNOSIS — Z7982 Long term (current) use of aspirin: Secondary | ICD-10-CM | POA: Diagnosis not present

## 2016-03-18 DIAGNOSIS — Z79899 Other long term (current) drug therapy: Secondary | ICD-10-CM | POA: Insufficient documentation

## 2016-03-18 DIAGNOSIS — C61 Malignant neoplasm of prostate: Secondary | ICD-10-CM

## 2016-03-18 DIAGNOSIS — Z923 Personal history of irradiation: Secondary | ICD-10-CM | POA: Diagnosis not present

## 2016-03-18 NOTE — Progress Notes (Signed)
Radiation Oncology         [phone] ________________________________  Name: Tyler Chavez MRN: [id number]  Date: 03/18/2016  DOB: 1951-03-27  Follow-Up Visit Note  CC: Glo Herring., MD  [employer], MD  Diagnosis:   65 yo with stage T1c adenocarcinoma of the prostate with a Gleason score of 3+3 and a PSA 8.2     ICD-9-CM ICD-10-CM   1. Malignant neoplasm of prostate (HCC) 185 C61     Interval Since Last Radiation:  1 months (02/19/2016)  Narrative:  The patient returns today for routine follow-up.  He is complaining of increased urinary frequency and urinary hesitation symptoms. He filled out a questionnaire regarding urinary function today providing and overall IPSS score of 16 characterizing his symptoms as moderate.  His pre-implant score was 5. He denies any bowel symptoms. Patient denies blood in urine and issues with rectum. Patient reports minimal complaints of urination issues.   ALLERGIES:  has No Known Allergies.  Meds: Current Outpatient Prescriptions  Medication Sig Dispense Refill  . amLODipine (NORVASC) 5 MG tablet Take 5 mg by mouth every morning.    [name] aspirin EC 81 MG tablet Take 81 mg by mouth daily.    . calcium carbonate (TUMS - DOSED IN MG ELEMENTAL CALCIUM) 500 MG chewable tablet Chew 1 tablet by mouth as needed for indigestion or heartburn.    . Misc Natural Products (PROSTATE HEALTH) CAPS Take by mouth daily.    . Multiple Vitamin (MULTIVITAMIN) tablet Take 1 tablet by mouth daily.    . tamsulosin (FLOMAX) 0.4 MG CAPS capsule Take 1 capsule (0.4 mg total) by mouth daily. 30 capsule 1  . triamterene-hydrochlorothiazide (MAXZIDE-25) 37.5-25 MG tablet Take 1 tablet by mouth every morning.    [name] HYDROcodone-acetaminophen (NORCO) 5-325 MG tablet Take 1 tablet by mouth every 6 (six) hours as needed for moderate pain. (Patient not taking: Reported on 03/18/2016) 15 tablet 0   No current facility-administered medications for this encounter.    Physical  Findings:  weight is 209 lb 3.2 oz (94.892 kg). His blood pressure is 156/92 and his pulse is 86. His respiration is 16 and oxygen saturation is 100%.   In general this is a well appearing African-American male in no acute distress. He's alert and oriented x4 and appropriate throughout the examination. Cardiopulmonary assessment is negative for acute distress and he exhibits normal effort.    Lab Findings: Lab Results  Component Value Date   WBC 5.5 02/12/2016   HGB 14.3 02/12/2016   HCT 42.8 02/12/2016   MCV 89.5 02/12/2016   PLT 218 02/12/2016    Radiographic Findings:  Patient underwent CT imaging in our clinic for post implant dosimetry. The CT appears to demonstrate an adequate distribution of radioactive seeds throughout the prostate gland. There no seeds in or near the rectum. Dr. Tammi Klippel suspects the final radiation plan and dosimetry will show appropriate coverage of the prostate gland.   Impression: The patient is recovering from the effects of radiation. His urinary symptoms should gradually improve over the next 4-6 months. We talked about this today. He is encouraged by his improvement already and is otherwise please with his outcome.   Plan: Today, we spent time talking to the patient about his prostate seed implant and resolving urinary symptoms. We also talked about long-term follow-up for prostate cancer following seed implant. He understands that ongoing PSA determinations and digital rectal exams will help perform surveillance to rule out disease recurrence. He understands what to  expect with his PSA measures. Patient was also educated today about some of the long-term effects from radiation including a small risk for rectal bleeding and possibly erectile dysfunction. We talked about some of the general management approaches to these potential complications. However, I did encourage the patient to contact our office or return at any point if he has questions or concerns related  to his previous radiation and prostate cancer. Will follow up with Dr. Jeffie Pollock in about 3 months.       Carola Rhine, [facility] Seen with and dictated for Sheral Apley. Tammi Klippel, MD   This document serves as a record of services personally performed by Shona Simpson, [facility] and was created on her behalf by Truddie Hidden, a trained medical scribe. The creation of this record is based on the scribe's personal observations and the provider's statements to them. This document has been checked and approved by the attending provider.

## 2016-03-18 NOTE — Progress Notes (Signed)
  Radiation Oncology         [phone] ________________________________  Name: Rayborn Maat MRN: [id number]  Date: 03/18/2016  DOB: 1951-02-13  COMPLEX SIMULATION NOTE  NARRATIVE:  The patient was brought to the [facility] today following prostate seed implantation approximately one month ago.  Identity was confirmed.  All relevant records and images related to the planned course of therapy were reviewed.  Then, the patient was set-up supine.  CT images were obtained.  The CT images were loaded into the planning software.  Then the prostate and rectum were contoured.  Treatment planning then occurred.  The implanted iodine 125 seeds were identified by the physics staff for projection of radiation distribution  I have requested : 3D Simulation  I have requested a DVH of the following structures: Prostate and rectum.    ________________________________  Sheral Apley Tammi Klippel, M.D.  This document serves as a record of services personally performed by Tyler Pita, MD. It was created on his behalf by Truddie Hidden, a trained medical scribe. The creation of this record is based on the scribe's personal observations and the provider's statements to them. This document has been checked and approved by the attending provider.

## 2016-03-18 NOTE — Progress Notes (Signed)
Weight and vitals stable. Denies pain. Reports incomplete emptying, intermittent urine stream, and weak stream about half the time. Reports urinary frequency, urgency, and straining to void less than half the time. Reports nocturia x 1. Post seed IPSS 16. Denies dysuria or hematuria. Denies leakage or incontinence. Denies diarrhea. Denies fatigue.   BP 156/92 mmHg  Pulse 86  Resp 16  Wt 209 lb 3.2 oz (94.892 kg)  SpO2 100% Wt Readings from Last 3 Encounters:  03/18/16 209 lb 3.2 oz (94.892 kg)  02/19/16 208 lb (94.348 kg)  10/16/15 205 lb (92.987 kg)

## 2016-03-24 ENCOUNTER — Ambulatory Visit: Payer: BLUE CROSS/BLUE SHIELD | Attending: Radiation Oncology

## 2016-03-24 DIAGNOSIS — Z510 Encounter for antineoplastic radiation therapy: Secondary | ICD-10-CM | POA: Diagnosis not present

## 2016-03-24 DIAGNOSIS — C61 Malignant neoplasm of prostate: Secondary | ICD-10-CM | POA: Insufficient documentation

## 2016-03-27 NOTE — Progress Notes (Signed)
  Radiation Oncology         [phone] ________________________________  Name: Tyler Chavez MRN: [id number]  Date: 03/18/2016  DOB: 1951/01/10  3D Planning Note   Prostate Brachytherapy Post-Implant Dosimetry  Diagnosis: 65 yo with stage T1c adenocarcinoma of the prostate with a Gleason score of 3+3 and a PSA 8.2   Narrative: On a previous date, Tyler Chavez returned following prostate seed implantation for post implant planning. He underwent CT scan complex simulation to delineate the three-dimensional structures of the pelvis and demonstrate the radiation distribution.  Since that time, the seed localization, and complex isodose planning with dose volume histograms have now been completed.  Results:   Prostate Coverage - The dose of radiation delivered to the 90% or more of the prostate gland (D90) was 118.57% of the prescription dose. This exceeds our goal of greater than 90%. Rectal Sparing - The volume of rectal tissue receiving the prescription dose or higher was 0.02 cc. This falls under our thresholds tolerance of 1.0 cc.  Impression: The prostate seed implant appears to show adequate target coverage and appropriate rectal sparing.  Plan:  The patient will continue to follow with urology for ongoing PSA determinations. I would anticipate a high likelihood for local tumor control with minimal risk for rectal morbidity.  ________________________________  Sheral Apley Tammi Klippel, M.D.

## 2016-10-07 ENCOUNTER — Encounter (HOSPITAL_COMMUNITY): Payer: Self-pay | Admitting: Cardiology

## 2016-10-07 ENCOUNTER — Inpatient Hospital Stay (HOSPITAL_COMMUNITY)
Admission: AD | Admit: 2016-10-07 | Discharge: 2016-10-14 | DRG: 246 | Disposition: A | Discharge status: Home / Self Care | Payer: BLUE CROSS/BLUE SHIELD | Source: Ambulatory Visit | Attending: Cardiovascular Disease | Admitting: Cardiovascular Disease

## 2016-10-07 ENCOUNTER — Encounter (HOSPITAL_COMMUNITY)
Admission: AD | Disposition: A | Discharge status: Home / Self Care | Payer: Self-pay | Source: Ambulatory Visit | Attending: Cardiovascular Disease

## 2016-10-07 DIAGNOSIS — I5023 Acute on chronic systolic (congestive) heart failure: Secondary | ICD-10-CM | POA: Diagnosis not present

## 2016-10-07 DIAGNOSIS — R570 Cardiogenic shock: Secondary | ICD-10-CM | POA: Diagnosis present

## 2016-10-07 DIAGNOSIS — Z87891 Personal history of nicotine dependence: Secondary | ICD-10-CM

## 2016-10-07 DIAGNOSIS — I5021 Acute systolic (congestive) heart failure: Secondary | ICD-10-CM | POA: Diagnosis not present

## 2016-10-07 DIAGNOSIS — Z8249 Family history of ischemic heart disease and other diseases of the circulatory system: Secondary | ICD-10-CM

## 2016-10-07 DIAGNOSIS — E7800 Pure hypercholesterolemia, unspecified: Secondary | ICD-10-CM | POA: Diagnosis not present

## 2016-10-07 DIAGNOSIS — Z955 Presence of coronary angioplasty implant and graft: Secondary | ICD-10-CM | POA: Diagnosis not present

## 2016-10-07 DIAGNOSIS — I2102 ST elevation (STEMI) myocardial infarction involving left anterior descending coronary artery: Secondary | ICD-10-CM | POA: Diagnosis present

## 2016-10-07 DIAGNOSIS — I110 Hypertensive heart disease with heart failure: Secondary | ICD-10-CM | POA: Diagnosis present

## 2016-10-07 DIAGNOSIS — C61 Malignant neoplasm of prostate: Secondary | ICD-10-CM | POA: Diagnosis present

## 2016-10-07 DIAGNOSIS — I2510 Atherosclerotic heart disease of native coronary artery without angina pectoris: Secondary | ICD-10-CM | POA: Diagnosis present

## 2016-10-07 DIAGNOSIS — I472 Ventricular tachycardia: Secondary | ICD-10-CM | POA: Diagnosis present

## 2016-10-07 DIAGNOSIS — E785 Hyperlipidemia, unspecified: Secondary | ICD-10-CM | POA: Diagnosis present

## 2016-10-07 DIAGNOSIS — I255 Ischemic cardiomyopathy: Secondary | ICD-10-CM | POA: Diagnosis present

## 2016-10-07 DIAGNOSIS — I519 Heart disease, unspecified: Secondary | ICD-10-CM | POA: Diagnosis not present

## 2016-10-07 DIAGNOSIS — I10 Essential (primary) hypertension: Secondary | ICD-10-CM | POA: Diagnosis not present

## 2016-10-07 DIAGNOSIS — J189 Pneumonia, unspecified organism: Secondary | ICD-10-CM | POA: Diagnosis not present

## 2016-10-07 DIAGNOSIS — R0600 Dyspnea, unspecified: Secondary | ICD-10-CM

## 2016-10-07 DIAGNOSIS — I639 Cerebral infarction, unspecified: Secondary | ICD-10-CM | POA: Diagnosis not present

## 2016-10-07 HISTORY — DX: Acute myocardial infarction, unspecified: I21.9

## 2016-10-07 HISTORY — DX: Pneumonia, unspecified organism: J18.9

## 2016-10-07 HISTORY — PX: CARDIAC CATHETERIZATION: SHX172

## 2016-10-07 HISTORY — DX: Atherosclerotic heart disease of native coronary artery without angina pectoris: I25.10

## 2016-10-07 LAB — POCT ACTIVATED CLOTTING TIME: ACTIVATED CLOTTING TIME: 527 s

## 2016-10-07 LAB — MRSA PCR SCREENING: MRSA by PCR: NEGATIVE

## 2016-10-07 SURGERY — LEFT HEART CATH AND CORONARY ANGIOGRAPHY

## 2016-10-07 MED ORDER — MIDAZOLAM HCL 2 MG/2ML IJ SOLN
INTRAMUSCULAR | Status: DC | PRN
  Administered 2016-10-07: 2 mg via INTRAVENOUS

## 2016-10-07 MED ORDER — BIVALIRUDIN 250 MG IV SOLR
INTRAVENOUS | Status: AC
  Filled 2016-10-07: qty 250

## 2016-10-07 MED ORDER — TICAGRELOR 90 MG PO TABS
ORAL_TABLET | ORAL | Status: AC
  Filled 2016-10-07: qty 1

## 2016-10-07 MED ORDER — HEPARIN (PORCINE) IN NACL 2-0.9 UNIT/ML-% IJ SOLN
INTRAMUSCULAR | Status: DC | PRN
  Administered 2016-10-07: 14:00:00

## 2016-10-07 MED ORDER — BIVALIRUDIN 250 MG IV SOLR
INTRAVENOUS | Status: DC | PRN
  Administered 2016-10-07: 14:00:00
  Administered 2016-10-07: 1.75 mg/kg/h via INTRAVENOUS

## 2016-10-07 MED ORDER — DOPAMINE-DEXTROSE 3.2-5 MG/ML-% IV SOLN
INTRAVENOUS | Status: AC
  Filled 2016-10-07: qty 250

## 2016-10-07 MED ORDER — SODIUM CHLORIDE 0.9% FLUSH
3.0000 mL | Freq: Two times a day (BID) | INTRAVENOUS | Status: DC
  Administered 2016-10-08 – 2016-10-10 (×5): 3 mL via INTRAVENOUS

## 2016-10-07 MED ORDER — VERAPAMIL HCL 2.5 MG/ML IV SOLN
INTRAVENOUS | Status: DC | PRN
  Administered 2016-10-07: 10 mL via INTRA_ARTERIAL

## 2016-10-07 MED ORDER — SODIUM CHLORIDE 0.9 % IV SOLN
0.1500 mg/kg/h | INTRAVENOUS | Status: AC
  Administered 2016-10-07: 0.15 mg/kg/h via INTRAVENOUS
  Filled 2016-10-07: qty 250

## 2016-10-07 MED ORDER — HEPARIN (PORCINE) IN NACL 2-0.9 UNIT/ML-% IJ SOLN
INTRAMUSCULAR | Status: AC
  Filled 2016-10-07: qty 1000

## 2016-10-07 MED ORDER — DOPAMINE-DEXTROSE 3.2-5 MG/ML-% IV SOLN
INTRAVENOUS | Status: DC | PRN
  Administered 2016-10-07: 5 ug/kg/min via INTRAVENOUS

## 2016-10-07 MED ORDER — NITROGLYCERIN 1 MG/10 ML FOR IR/CATH LAB
INTRA_ARTERIAL | Status: DC | PRN
  Administered 2016-10-07: 100 ug via INTRACORONARY

## 2016-10-07 MED ORDER — FENTANYL CITRATE (PF) 100 MCG/2ML IJ SOLN
INTRAMUSCULAR | Status: AC
  Filled 2016-10-07: qty 2

## 2016-10-07 MED ORDER — LIDOCAINE HCL (PF) 1 % IJ SOLN
INTRAMUSCULAR | Status: DC | PRN
  Administered 2016-10-07: 2 mL

## 2016-10-07 MED ORDER — SODIUM CHLORIDE 0.9 % IV SOLN
INTRAVENOUS | Status: DC
  Administered 2016-10-07: 90 mL/h via INTRAVENOUS

## 2016-10-07 MED ORDER — ACETAMINOPHEN 325 MG PO TABS: 650.0000 mg | ORAL_TABLET | ORAL | Status: DC | PRN

## 2016-10-07 MED ORDER — MIDAZOLAM HCL 2 MG/2ML IJ SOLN
INTRAMUSCULAR | Status: AC
  Filled 2016-10-07: qty 2

## 2016-10-07 MED ORDER — ONDANSETRON HCL 4 MG/2ML IJ SOLN: 4.0000 mg | Freq: Four times a day (QID) | INTRAMUSCULAR | Status: DC | PRN

## 2016-10-07 MED ORDER — NITROGLYCERIN 1 MG/10 ML FOR IR/CATH LAB
INTRA_ARTERIAL | Status: AC
  Filled 2016-10-07: qty 10

## 2016-10-07 MED ORDER — HYDRALAZINE HCL 20 MG/ML IJ SOLN: 5.0000 mg | INTRAMUSCULAR | Status: AC | PRN

## 2016-10-07 MED ORDER — FENTANYL CITRATE (PF) 100 MCG/2ML IJ SOLN
INTRAMUSCULAR | Status: DC | PRN
  Administered 2016-10-07: 25 ug via INTRAVENOUS

## 2016-10-07 MED ORDER — ASPIRIN 81 MG PO CHEW
81.0000 mg | CHEWABLE_TABLET | Freq: Every day | ORAL | Status: DC
  Administered 2016-10-08 – 2016-10-14 (×7): 81 mg via ORAL
  Filled 2016-10-07 (×8): qty 1

## 2016-10-07 MED ORDER — SODIUM CHLORIDE 0.9% FLUSH: 3.0000 mL | INTRAVENOUS | Status: DC | PRN

## 2016-10-07 MED ORDER — TICAGRELOR 90 MG PO TABS
ORAL_TABLET | ORAL | Status: DC | PRN
  Administered 2016-10-07: 180 mg via ORAL

## 2016-10-07 MED ORDER — DIAZEPAM 5 MG PO TABS: 5.0000 mg | ORAL_TABLET | Freq: Four times a day (QID) | ORAL | Status: DC | PRN

## 2016-10-07 MED ORDER — LABETALOL HCL 5 MG/ML IV SOLN: 10.0000 mg | INTRAVENOUS | Status: AC | PRN

## 2016-10-07 MED ORDER — SODIUM CHLORIDE 0.9 % IV SOLN
250.0000 mL | INTRAVENOUS | Status: DC | PRN
  Administered 2016-10-08: 250 mL via INTRAVENOUS

## 2016-10-07 MED ORDER — DOPAMINE-DEXTROSE 3.2-5 MG/ML-% IV SOLN: 0.0000 ug/kg/min | INTRAVENOUS | Status: DC

## 2016-10-07 MED ORDER — IOPAMIDOL (ISOVUE-370) INJECTION 76%
INTRAVENOUS | Status: AC
  Filled 2016-10-07: qty 125

## 2016-10-07 MED ORDER — IOPAMIDOL (ISOVUE-370) INJECTION 76%
INTRAVENOUS | Status: DC | PRN
  Administered 2016-10-07: 165 mL

## 2016-10-07 MED ORDER — VERAPAMIL HCL 2.5 MG/ML IV SOLN
INTRAVENOUS | Status: AC
  Filled 2016-10-07: qty 2

## 2016-10-07 MED ORDER — LIDOCAINE HCL (PF) 1 % IJ SOLN
INTRAMUSCULAR | Status: AC
  Filled 2016-10-07: qty 30

## 2016-10-07 MED ORDER — TICAGRELOR 90 MG PO TABS
90.0000 mg | ORAL_TABLET | Freq: Two times a day (BID) | ORAL | Status: DC
  Administered 2016-10-07 – 2016-10-14 (×14): 90 mg via ORAL
  Filled 2016-10-07 (×14): qty 1

## 2016-10-07 MED ORDER — PNEUMOCOCCAL VAC POLYVALENT 25 MCG/0.5ML IJ INJ: 0.5000 mL | INJECTION | INTRAMUSCULAR | Status: DC

## 2016-10-07 MED ORDER — IOPAMIDOL (ISOVUE-370) INJECTION 76%
INTRAVENOUS | Status: AC
  Filled 2016-10-07: qty 100

## 2016-10-07 MED ORDER — BIVALIRUDIN BOLUS VIA INFUSION - CUPID
INTRAVENOUS | Status: DC | PRN
  Administered 2016-10-07: 71.475 mg via INTRAVENOUS

## 2016-10-07 MED ORDER — SODIUM CHLORIDE 0.9 % IV SOLN
INTRAVENOUS | Status: DC | PRN
  Administered 2016-10-07: 999 mL/h via INTRAVENOUS

## 2016-10-07 MED ORDER — ATORVASTATIN CALCIUM 80 MG PO TABS
80.0000 mg | ORAL_TABLET | Freq: Every day | ORAL | Status: DC
  Administered 2016-10-07 – 2016-10-13 (×7): 80 mg via ORAL
  Filled 2016-10-07 (×8): qty 1

## 2016-10-07 SURGICAL SUPPLY — 19 items
BALLN MOZEC 2.50X14 (BALLOONS) ×2
BALLN ~~LOC~~ EMERGE MR 4.0X12 (BALLOONS) ×3
BALLOON MOZEC 2.50X14 (BALLOONS) ×1 IMPLANT
BALLOON ~~LOC~~ EMERGE MR 4.0X12 (BALLOONS) ×1 IMPLANT
CATH EXPO 5FR ANG PIGTAIL 145 (CATHETERS) ×3 IMPLANT
CATH OPTITORQUE TIG 4.0 5F (CATHETERS) ×3 IMPLANT
CATH VISTA GUIDE 6FR XBLAD3.5 (CATHETERS) ×3 IMPLANT
DEVICE RAD COMP TR BAND LRG (VASCULAR PRODUCTS) ×3 IMPLANT
ELECT DEFIB PAD ADLT CADENCE (PAD) ×3 IMPLANT
GLIDESHEATH SLEND SS 6F .021 (SHEATH) ×3 IMPLANT
GUIDEWIRE INQWIRE 1.5J.035X260 (WIRE) ×2 IMPLANT
INQWIRE 1.5J .035X260CM (WIRE) ×6
KIT ENCORE 26 ADVANTAGE (KITS) ×3 IMPLANT
KIT HEART LEFT (KITS) ×3 IMPLANT
PACK CARDIAC CATHETERIZATION (CUSTOM PROCEDURE TRAY) ×3 IMPLANT
STENT RESOLUTE ONYX 4.0X15 (Permanent Stent) ×3 IMPLANT
TRANSDUCER W/STOPCOCK (MISCELLANEOUS) ×3 IMPLANT
TUBING CIL FLEX 10 FLL-RA (TUBING) ×3 IMPLANT
WIRE PT2 MS 185 (WIRE) ×3 IMPLANT

## 2016-10-07 NOTE — H&P (Signed)
Cardiology H&P    Patient ID: Tyler Chavez MRN: [id number], DOB/AGE: 11-30-1950   Admit date: 10/07/2016 Date of Consult: 10/07/2016  Primary Physician: Glo Herring., MD  Primary Cardiologist: New, Dr. Claiborne Billings    Patient Profile    Tyler Chavez is a 66 year old male with a past medical history of HTN, HLD, and prostate CA. He presented as an anterior STEMI on 10/07/16.   History of Present Illness    Tyler Chavez began having chest pain on the night of 10/06/16. He thought he was having some indigestion pain as he had a burning sensation in his mid chest. The pain gradually intensified and was eventually associated with SOB and left arm pain. He was taken to [facility] ED.   EKG at [facility] showed anterior ST elevation with reciprocal depression in the inferior leads. He was taken urgently to [facility] cath lab as a code STEMI.   He tells me that he does not have a history of DM or CVA. His mother had a MI when he was very young.   Past Medical History   Past Medical History:  Diagnosis Date  . ED (erectile dysfunction)   . Frequency of urination   . GERD (gastroesophageal reflux disease)   . Hyperlipidemia   . Hypertension   . Prostate cancer [facility]) urologist- dr wrenn/  oncologist-  dr Tammi Klippel   Stage T1c, Gleason 3+3, PSA 8.2, vol 49.9grams  . Rash    lower right leg  . Wears glasses     Past Surgical History:  Procedure Laterality Date  . CYSTOSCOPY  02/19/2016   Procedure: CYSTOSCOPY;  Surgeon: Irine Seal, MD;  Location: [facility];  Service: Urology;;  no seeds visualized in bladder  . NO PAST SURGERIES    . RADIOACTIVE SEED IMPLANT N/A 02/19/2016   Procedure: RADIOACTIVE SEED IMPLANT/BRACHYTHERAPY IMPLANT;  Surgeon: Irine Seal, MD;  Location: [facility];  Service: Urology;  Laterality: N/A;  48   seeds implanted     Allergies  No Known Allergies  Inpatient Medications      Family History    Family History  Problem Relation Age  of Onset  . Heart attack Mother     Social History    Social History   Social History  . Marital status: Widowed    Spouse name: N/A  . Number of children: N/A  . Years of education: N/A   Occupational History  . Not on file.   Social History Main Topics  . Smoking status: Former Smoker    Packs/day: 1.50    Years: 42.00    Types: Cigarettes    Quit date: 02/11/2014  . Smokeless tobacco: Never Used  . Alcohol use Yes     Comment: occasional  . Drug use: No  . Sexual activity: Not on file   Other Topics Concern  . Not on file   Social History Narrative  . No narrative on file     Review of Systems    General:  No chills, fever, night sweats or weight changes.  Cardiovascular:  + chest pain, dyspnea on exertion, edema, orthopnea, palpitations, paroxysmal nocturnal dyspnea. Dermatological: No rash, lesions/masses Respiratory: No cough, dyspnea Urologic: No hematuria, dysuria Abdominal:   No nausea, vomiting, diarrhea, bright red blood per rectum, melena, or hematemesis Neurologic:  No visual changes, wkns, changes in mental status. All other systems reviewed and are otherwise negative except as noted above.  Physical Exam  Blood pressure (!) 88/59, pulse 79, resp. rate (!) 39, weight 210 lb (95.3 kg), SpO2 (!) 0 %.  General: Pleasant, NAD Psych: Normal affect. Neuro: Alert and oriented X 3. Moves all extremities spontaneously. HEENT: Normal  Neck: Supple without bruits or JVD. Lungs:  Resp regular and unlabored, CTA. Heart: RRR no s3, s4, or murmurs. Abdomen: Soft, non-tender, non-distended, BS + x 4.  Extremities: No clubbing, cyanosis or edema. DP/PT/Radials 2+ and equal bilaterally.  Labs     Lab Results  Component Value Date   WBC 5.5 02/12/2016   HGB 14.3 02/12/2016   HCT 42.8 02/12/2016   MCV 89.5 02/12/2016   PLT 218 02/12/2016    Radiology Studies    No results found.  EKG & Cardiac Imaging    EKG: NSR with anterolateral ST  elevation   Echocardiogram: pending.   Assessment & Plan    1. Anterior STEMI: Found to have a proximal LAD lesion that was treated with a DES. He also has significant disease in his RCA and PLA. He will need staged intervention for this. [name] recommendations to follow.   Currently he is hypotensive, dopamine gtt started. Will continue to hydrate him as well. Titrate dopamine for MAP > 65.   Will need echo to assess LV function.   Signed, Arbutus Leas, NP 10/07/2016, 2:34 PM Pager: [phone]

## 2016-10-07 NOTE — Progress Notes (Signed)
ANTICOAGULATION CONSULT NOTE - Initial Consult  Pharmacy Consult for bivalirudin Indication: Post-cath STEMI  No Known Allergies  Patient Measurements: Weight: 210 lb (95.3 kg)   Vital Signs: BP: 88/59 (01/11 1414) Pulse Rate: 79 (01/11 1414)  Labs: No results for input(s): HGB, HCT, PLT, APTT, LABPROT, INR, HEPARINUNFRC, HEPRLOWMOCWT, CREATININE, CKTOTAL, CKMB, TROPONINI in the last 72 hours.  CrCl cannot be calculated (Patient's most recent lab result is older than the maximum 21 days allowed.). CrCl 112 mL/min   Medical History: Past Medical History:  Diagnosis Date  . ED (erectile dysfunction)   . Frequency of urination   . GERD (gastroesophageal reflux disease)   . Hyperlipidemia   . Hypertension   . Prostate cancer [facility]) urologist- dr wrenn/  oncologist-  dr Tammi Klippel   Stage T1c, Gleason 3+3, PSA 8.2, vol 49.9grams  . Rash    lower right leg  . Wears glasses     Assessment: Pt is 16 YOM who presented with STEMI and is s/p PCI. Bivalirudin was inititated during cath. Creatinine clearance >100 mL/min.  Goal of Therapy:  Monitor platelets by anticoagulation protocol: Yes   Plan:  Bivalirudin gtt 0.15 mg/kg/hr x4 hours post PCI Monitor CBC, renal function, s/sx bleeding  Cheral Almas, PharmD Candidate 10/07/2016,2:58 PM

## 2016-10-08 ENCOUNTER — Encounter (HOSPITAL_COMMUNITY): Payer: Self-pay | Admitting: Cardiovascular Disease

## 2016-10-08 ENCOUNTER — Inpatient Hospital Stay (HOSPITAL_COMMUNITY): Payer: BLUE CROSS/BLUE SHIELD

## 2016-10-08 DIAGNOSIS — I10 Essential (primary) hypertension: Secondary | ICD-10-CM

## 2016-10-08 DIAGNOSIS — I639 Cerebral infarction, unspecified: Secondary | ICD-10-CM

## 2016-10-08 DIAGNOSIS — E7800 Pure hypercholesterolemia, unspecified: Secondary | ICD-10-CM

## 2016-10-08 LAB — POCT I-STAT 3, ART BLOOD GAS (G3+)
ACID-BASE EXCESS: 2 mmol/L (ref 0.0–2.0)
Bicarbonate: 23.5 mmol/L (ref 20.0–28.0)
O2 SAT: 95 %
PH ART: 7.515 — AB (ref 7.350–7.450)
PO2 ART: 64 mmHg — AB (ref 83.0–108.0)
TCO2: 24 mmol/L (ref 0–100)
pCO2 arterial: 29.1 mmHg — ABNORMAL LOW (ref 32.0–48.0)

## 2016-10-08 LAB — CBC
HCT: 38.5 % — ABNORMAL LOW (ref 39.0–52.0)
Hemoglobin: 13.2 g/dL (ref 13.0–17.0)
MCH: 30.7 pg (ref 26.0–34.0)
MCHC: 34.3 g/dL (ref 30.0–36.0)
MCV: 89.5 fL (ref 78.0–100.0)
PLATELETS: 254 10*3/uL (ref 150–400)
RBC: 4.3 MIL/uL (ref 4.22–5.81)
RDW: 13.7 % (ref 11.5–15.5)
WBC: 8.6 10*3/uL (ref 4.0–10.5)

## 2016-10-08 LAB — BASIC METABOLIC PANEL
Anion gap: 12 (ref 5–15)
BUN: 11 mg/dL (ref 6–20)
CHLORIDE: 108 mmol/L (ref 101–111)
CO2: 20 mmol/L — ABNORMAL LOW (ref 22–32)
CREATININE: 0.89 mg/dL (ref 0.61–1.24)
Calcium: 9.2 mg/dL (ref 8.9–10.3)
GFR calc Af Amer: >60 mL/min (ref >60)
Glucose, Bld: 113 mg/dL — ABNORMAL HIGH (ref 65–99)
Potassium: 3.9 mmol/L (ref 3.5–5.1)
SODIUM: 140 mmol/L (ref 135–145)

## 2016-10-08 LAB — TROPONIN I

## 2016-10-08 LAB — ECHOCARDIOGRAM COMPLETE
Height: 74 in
WEIGHTICAEL: 3298.08 [oz_av]

## 2016-10-08 MED ORDER — DEXTROSE 5 % IV SOLN
1.0000 g | INTRAVENOUS | Status: AC
  Administered 2016-10-08 – 2016-10-12 (×5): 1 g via INTRAVENOUS
  Filled 2016-10-08 (×5): qty 10

## 2016-10-08 MED ORDER — AZITHROMYCIN 500 MG PO TABS
500.0000 mg | ORAL_TABLET | Freq: Every day | ORAL | Status: AC
  Administered 2016-10-08: 500 mg via ORAL
  Filled 2016-10-08: qty 1

## 2016-10-08 MED ORDER — ALPRAZOLAM 0.25 MG PO TABS
0.2500 mg | ORAL_TABLET | Freq: Three times a day (TID) | ORAL | Status: DC | PRN
  Administered 2016-10-08 – 2016-10-10 (×4): 0.25 mg via ORAL
  Filled 2016-10-08 (×4): qty 1

## 2016-10-08 MED ORDER — FUROSEMIDE 10 MG/ML IJ SOLN
40.0000 mg | Freq: Two times a day (BID) | INTRAMUSCULAR | Status: DC
  Administered 2016-10-08 – 2016-10-09 (×2): 40 mg via INTRAVENOUS
  Filled 2016-10-08 (×2): qty 4

## 2016-10-08 MED ORDER — FUROSEMIDE 10 MG/ML IJ SOLN
40.0000 mg | Freq: Every day | INTRAMUSCULAR | Status: DC
  Administered 2016-10-08: 40 mg via INTRAVENOUS
  Filled 2016-10-08: qty 4

## 2016-10-08 MED ORDER — AZITHROMYCIN 250 MG PO TABS
250.0000 mg | ORAL_TABLET | Freq: Every day | ORAL | Status: AC
  Administered 2016-10-09 – 2016-10-12 (×4): 250 mg via ORAL
  Filled 2016-10-08 (×4): qty 1

## 2016-10-08 MED ORDER — CARVEDILOL 3.125 MG PO TABS
3.1250 mg | ORAL_TABLET | Freq: Two times a day (BID) | ORAL | Status: DC
  Administered 2016-10-08 – 2016-10-14 (×12): 3.125 mg via ORAL
  Filled 2016-10-08 (×12): qty 1

## 2016-10-08 NOTE — Progress Notes (Signed)
Patient Name: Tyler Chavez Date of Encounter: 10/08/2016  Primary Cardiologist: [facility] Problem List     Active Problems:   STEMI involving left anterior descending coronary artery [facility])   Acute ST elevation myocardial infarction (STEMI) involving left anterior descending (LAD) coronary artery (HCC)     Subjective   Mild chest pain, mild dyspnea  Inpatient Medications    Scheduled Meds: . aspirin  81 mg Oral Daily  . atorvastatin  80 mg Oral q1800  . pneumococcal 23 valent vaccine  0.5 mL Intramuscular Tomorrow-1000  . sodium chloride flush  3 mL Intravenous Q12H  . ticagrelor  90 mg Oral BID   Continuous Infusions: . sodium chloride Stopped (10/08/16 0543)  . DOPamine Stopped (10/07/16 1927)   PRN Meds: sodium chloride, acetaminophen, diazepam, ondansetron (ZOFRAN) IV, sodium chloride flush   Vital Signs    Vitals:   10/08/16 0400 10/08/16 0500 10/08/16 0600 10/08/16 0700  BP: 117/81 115/83 111/79 107/79  Pulse: 95 94 93 96  Resp: (!) 24 18 (!) 23 (!) 30  Temp:      TempSrc:      SpO2: 94% 96% 95% 97%  Weight:      Height:        Intake/Output Summary (Last 24 hours) at 10/08/16 0727 Last data filed at 10/08/16 0500  Gross per 24 hour  Intake           2186.5 ml  Output             1600 ml  Net            586.5 ml   Filed Weights   10/07/16 1300 10/07/16 1500  Weight: 210 lb (95.3 kg) 206 lb 2.1 oz (93.5 kg)    Physical Exam  GEN: Well nourished, well developed, in no acute distress.  HEENT: Grossly normal.  Neck: Supple, no JVD, carotid bruits, or masses. Cardiac: RRR, no murmurs, rubs, or gallops. No clubbing, cyanosis, edema.  Radials/DP/PT 2+ and equal bilaterally.  Respiratory:  Respirations regular and unlabored, clear to auscultation bilaterally. GI: Soft, nontender, nondistended, BS + x 4. MS: no deformity or atrophy. Skin: warm and dry, no rash. Neuro:  Strength and sensation are intact. Psych: AAOx3.  Normal affect.  Labs    CBC  Recent Labs  10/08/16 0321  WBC 8.6  HGB 13.2  HCT 38.5*  MCV 89.5  PLT 0000000   Basic Metabolic Panel  Recent Labs  10/08/16 0321  NA 140  K 3.9  CL 108  CO2 20*  GLUCOSE 113*  BUN 11  CREATININE 0.89  CALCIUM 9.2   Cardiac Enzymes  Recent Labs  10/07/16 1509 10/08/16 0321  TROPONINI 72.40* >65.00*    Telemetry    Sinus with several short runs of NSVT- Personally Reviewed  ECG    Sinus, evolving changes post anterior MI- Personally Reviewed  Radiology    No results found.  Cardiac Studies   Coronary Diagrams   Diagnostic Diagram     Post-Intervention Diagram     Implants     Permanent Stent  Stent Resolute Onyx 4.0x15 [id number] - Implanted        Patient Profile     66 yo male with history of HTN, HLD, prostate cancer and former tobacco abuse admitted with anterior STEMI on 10/07/16. His LAD was occluded at the ostium and was treated with a DES.   Assessment & Plan    1. CAD/Anterior STEMI: Pt admitted 10/07/16  with an anterior STEMI. A drug eluting stent was placed in the ostial LAD. He has residual severe stenosis in the proximal RCA, right posterolateral branch and proximal Circumflex. He is currently on DAPT with ASA and Brilinta. He is on a high intensity statin.  -Will add low dose Coreg today given NSVT and tachycardia.  -Add Ace-inh as BP tolerates.  -Echo today to assess LVEF -Single dose IV Lasix for possible volume overload (LVEDP 23 mmHg by cath).  - He will need staged PCI of the Circumflex and RCA prior to discharge.   2. HTN: BP stable this am  3. Hyperlipidemia: He is on a statin.   Signed, Lauree Chandler, MD  10/08/2016, 7:27 AM

## 2016-10-08 NOTE — Progress Notes (Signed)
Respiratory therapist notified of order for blood gas.

## 2016-10-08 NOTE — Progress Notes (Signed)
  Echocardiogram 2D Echocardiogram has been performed.  Tyler Chavez L Androw 10/08/2016, 9:57 AM

## 2016-10-08 NOTE — Progress Notes (Signed)
[employer] notified of pt having episodes of tachypnea (30s-40) and tachycardia (120s) where patient states he is "stuffy and can't breathe."  Bilateral breath sounds clear, oxygen saturation 92% on room air.  2L [place] started for pt comfort.  Pt also stated he has anxiety at home, but does not take anything for it. Orders received.  Will continue to monitor pt closely.

## 2016-10-08 NOTE — Progress Notes (Signed)
Dr. Angelena Form notified of pt complaint of shortness of breath, along with intermittent tachycardia and tachypnea. Pt also became diaphoretic with ambulation to bathroom. Also notified that ABG and CXRay done.  Will continue to monitor pt closely.

## 2016-10-08 NOTE — Progress Notes (Signed)
Called to see patient for SOB. He has had episodic SOB during the day today. He has responded well to IV Lasix. Chest x-ray with left lung pneumonia. Exam with rhonci in the left lung. No LE edema.  Will begin antibiotics today. Will increase Lasix to 40 mg IV BID.  If dyspnea does not resolve, consider changing Brilinta to Effient.   Lauree Chandler 10/08/2016 2:23 PM

## 2016-10-08 NOTE — Progress Notes (Signed)
   10/08/16 1320 10/08/16 1325  Vitals  Pulse Rate (!) 109 100  ECG Heart Rate (!) 109 100  Resp (!) 28 (!) 30  Oxygen Therapy  SpO2 94 % 97 %  O2 Device --  Nasal Cannula  O2 Flow Rate (L/min) --  2 L/min  Kerin Ransom notified of pt complaining of shortness of breath after ambulating to and from the bathroom.  Resp rate 20s-30, heart rate 100-110.  RN to notify Dr. Angelena Form.

## 2016-10-09 DIAGNOSIS — I5021 Acute systolic (congestive) heart failure: Secondary | ICD-10-CM

## 2016-10-09 LAB — CBC
HCT: 40.8 % (ref 39.0–52.0)
HEMOGLOBIN: 13.9 g/dL (ref 13.0–17.0)
MCH: 30.8 pg (ref 26.0–34.0)
MCHC: 34.1 g/dL (ref 30.0–36.0)
MCV: 90.3 fL (ref 78.0–100.0)
Platelets: 216 10*3/uL (ref 150–400)
RBC: 4.52 MIL/uL (ref 4.22–5.81)
RDW: 14.1 % (ref 11.5–15.5)
WBC: 7.4 10*3/uL (ref 4.0–10.5)

## 2016-10-09 LAB — BASIC METABOLIC PANEL
ANION GAP: 9 (ref 5–15)
BUN: 12 mg/dL (ref 6–20)
CHLORIDE: 104 mmol/L (ref 101–111)
CO2: 27 mmol/L (ref 22–32)
CREATININE: 1.04 mg/dL (ref 0.61–1.24)
Calcium: 8.8 mg/dL — ABNORMAL LOW (ref 8.9–10.3)
GFR calc non Af Amer: >60 mL/min (ref >60)
Glucose, Bld: 102 mg/dL — ABNORMAL HIGH (ref 65–99)
Potassium: 3.3 mmol/L — ABNORMAL LOW (ref 3.5–5.1)
SODIUM: 140 mmol/L (ref 135–145)

## 2016-10-09 LAB — HEMOGLOBIN A1C
Hgb A1c MFr Bld: 4.9 % (ref 4.8–5.6)
Mean Plasma Glucose: 94 mg/dL

## 2016-10-09 MED ORDER — POTASSIUM CHLORIDE CRYS ER 20 MEQ PO TBCR
40.0000 meq | EXTENDED_RELEASE_TABLET | Freq: Once | ORAL | Status: AC
  Administered 2016-10-09: 40 meq via ORAL
  Filled 2016-10-09: qty 2

## 2016-10-09 MED ORDER — FUROSEMIDE 40 MG PO TABS
40.0000 mg | ORAL_TABLET | Freq: Every day | ORAL | Status: DC
  Filled 2016-10-09 (×2): qty 1

## 2016-10-09 MED ORDER — HEPARIN SODIUM (PORCINE) 5000 UNIT/ML IJ SOLN
5000.0000 [IU] | Freq: Three times a day (TID) | INTRAMUSCULAR | Status: DC
  Administered 2016-10-09 – 2016-10-11 (×6): 5000 [IU] via SUBCUTANEOUS
  Filled 2016-10-09 (×6): qty 1

## 2016-10-09 MED ORDER — LISINOPRIL 2.5 MG PO TABS
2.5000 mg | ORAL_TABLET | Freq: Every day | ORAL | Status: DC
  Administered 2016-10-09 – 2016-10-14 (×6): 2.5 mg via ORAL
  Filled 2016-10-09 (×6): qty 1

## 2016-10-09 NOTE — Progress Notes (Signed)
CARDIAC REHAB PHASE I   PRE:  Rate/Rhythm: 93 SR  BP:  Supine:   Sitting: 126/76  Standing: 109/84   SaO2: 96 RA  MODE:  Ambulation: 8 ft   POST:  Rate/Rhythm: 108 ST  BP:  Supine:   Sitting: 109/84  Standing: 106/71   SaO2: 94 RA Education completed re: MI , activity restrictions, and dual antiplatelet therapy.  Upon standing c/o dizziness and felt as if he would faint, walked from bed to door and did not feel well, checked blood pressure and it dropped to 109/84 and had to sit.  Sat for 3 minutes, stood again, rechecked BP 106/71, again felt as if he would faint, placed in recliner.  States he feels better after sitting.  Ambulation held at this time.   [id number] Liliane Channel RN, BSN 10/09/2016 2:11 PM

## 2016-10-09 NOTE — Progress Notes (Signed)
Patient ID: Tyler Chavez, male   DOB: [birth year], 66 y.o.   MRN: [id number]   SUBJECTIVE: No complaints this morning, breathing much better.   CXR: Left-sided infiltrate.  Echo: EF 25-30%, regional wall motion abnormalities.  LHC: Proximal LAD occluded, 75% prox LCx, 90% proximal RCA, 95% PLV => DES to pLAD.   Scheduled Meds: . aspirin  81 mg Oral Daily  . atorvastatin  80 mg Oral q1800  . azithromycin  250 mg Oral Daily  . carvedilol  3.125 mg Oral BID WC  . cefTRIAXone (ROCEPHIN)  IV  1 g Intravenous Q24H  . furosemide  40 mg Oral Daily  . lisinopril  2.5 mg Oral Daily  . potassium chloride  40 mEq Oral Once  . sodium chloride flush  3 mL Intravenous Q12H  . ticagrelor  90 mg Oral BID   Continuous Infusions: . sodium chloride Stopped (10/08/16 0543)  . DOPamine Stopped (10/07/16 1927)   PRN Meds:.sodium chloride, acetaminophen, ALPRAZolam, ondansetron (ZOFRAN) IV, sodium chloride flush    Vitals:   10/09/16 0600 10/09/16 0700 10/09/16 0805 10/09/16 0810  BP: 108/71 101/71 102/69 102/69  Pulse: 89 89 92 94  Resp: (!) 22 (!) 22 (!) 21   Temp:   98.8 F (37.1 C)   TempSrc:   Oral   SpO2: 98% 97% 99%   Weight:      Height:        Intake/Output Summary (Last 24 hours) at 10/09/16 0905 Last data filed at 10/09/16 0400  Gross per 24 hour  Intake           649.16 ml  Output             3175 ml  Net         -2525.84 ml    LABS: Basic Metabolic Panel:  Recent Labs  10/08/16 0321 10/09/16 0353  NA 140 140  K 3.9 3.3*  CL 108 104  CO2 20* 27  GLUCOSE 113* 102*  BUN 11 12  CREATININE 0.89 1.04  CALCIUM 9.2 8.8*   Liver Function Tests: No results for input(s): AST, ALT, ALKPHOS, BILITOT, PROT, ALBUMIN in the last 72 hours. No results for input(s): LIPASE, AMYLASE in the last 72 hours. CBC:  Recent Labs  10/08/16 0321 10/09/16 0353  WBC 8.6 7.4  HGB 13.2 13.9  HCT 38.5* 40.8  MCV 89.5 90.3  PLT 254 216   Cardiac Enzymes:  Recent Labs  10/07/16 1509  10/08/16 0321  TROPONINI >65.00* >65.00*   BNP: Invalid input(s): POCBNP D-Dimer: No results for input(s): DDIMER in the last 72 hours. Hemoglobin A1C:  Recent Labs  10/08/16 0321  HGBA1C 4.9   Fasting Lipid Panel: No results for input(s): CHOL, HDL, LDLCALC, TRIG, CHOLHDL, LDLDIRECT in the last 72 hours. Thyroid Function Tests: No results for input(s): TSH, T4TOTAL, T3FREE, THYROIDAB in the last 72 hours.  Invalid input(s): FREET3 Anemia Panel: No results for input(s): VITAMINB12, FOLATE, FERRITIN, TIBC, IRON, RETICCTPCT in the last 72 hours.  RADIOLOGY: Dg Chest Port 1 View  Result Date: 10/08/2016 CLINICAL DATA:  Shortness of breath . EXAM: PORTABLE CHEST 1 VIEW COMPARISON:  No prior . FINDINGS: Mediastinum hilar structures are normal. Diffuse left lung infiltrate. No pleural effusion or pneumothorax. Cardiomegaly with normal pulmonary vascularity. No acute bony abnormality P IMPRESSION: Diffuse left lung infiltrate consistent with pneumonia . Electronically Signed   By: Pajaro   On: 10/08/2016 12:10    PHYSICAL EXAM General: NAD Neck: No JVD,  no thyromegaly or thyroid nodule.  Lungs: Crackles on left CV: Nondisplaced PMI.  Heart regular S1/S2, no S3/S4, no murmur.  No peripheral edema.  No carotid bruit.  Normal pedal pulses.  Abdomen: Soft, nontender, no hepatosplenomegaly, no distention.  Neurologic: Alert and oriented x 3.  Psych: Normal affect. Extremities: No clubbing or cyanosis.   TELEMETRY: Reviewed telemetry pt in NSR  ASSESSMENT AND PLAN: 66 yo with history of HTN presented with anterior STEMI.  1. CAD: Anterior STEMI.  S/p DES to occluded proximal LAD.  He has residual disease in the LCx, RCA, PLV.  No chest pain.  - Would plan staged PCI to LCx, RCA system on Monday.  - Continue ASA, ticagrelor, high dose statin.  2. Acute systolic CHF: EF [id number] on echo this admission, ischemic cardiomyopathy.  Breathing much better after good diuresis with IV  Lasix.  Looks euvolemic on exam today.  SBP in 100s.  - Can stop IV Lasix after dose this morning, start po tomorrow.  - Continue Coreg 3.125 mg bid.  - Add lisinopril 2.5 daily.  - Probably will add spironolactone tomorrow.  - He will need Lifevest at discharge.  3. PNA: Left-sided infiltrate, afebrile.  Treating with ceftriaxone/azithromycin.   Loralie Champagne 10/09/2016 9:11 AM

## 2016-10-10 DIAGNOSIS — I5023 Acute on chronic systolic (congestive) heart failure: Secondary | ICD-10-CM

## 2016-10-10 LAB — CBC
HCT: 41.5 % (ref 39.0–52.0)
HEMOGLOBIN: 13.8 g/dL (ref 13.0–17.0)
MCH: 30.7 pg (ref 26.0–34.0)
MCHC: 33.3 g/dL (ref 30.0–36.0)
MCV: 92.2 fL (ref 78.0–100.0)
Platelets: 195 10*3/uL (ref 150–400)
RBC: 4.5 MIL/uL (ref 4.22–5.81)
RDW: 14.4 % (ref 11.5–15.5)
WBC: 7.4 10*3/uL (ref 4.0–10.5)

## 2016-10-10 LAB — BASIC METABOLIC PANEL
ANION GAP: 10 (ref 5–15)
BUN: 21 mg/dL — ABNORMAL HIGH (ref 6–20)
CALCIUM: 8.9 mg/dL (ref 8.9–10.3)
CO2: 23 mmol/L (ref 22–32)
CREATININE: 1.09 mg/dL (ref 0.61–1.24)
Chloride: 105 mmol/L (ref 101–111)
Glucose, Bld: 106 mg/dL — ABNORMAL HIGH (ref 65–99)
Potassium: 3.5 mmol/L (ref 3.5–5.1)
SODIUM: 138 mmol/L (ref 135–145)

## 2016-10-10 MED ORDER — ZOLPIDEM TARTRATE 5 MG PO TABS: 5.0000 mg | ORAL_TABLET | Freq: Every evening | ORAL | Status: DC | PRN

## 2016-10-10 MED ORDER — SODIUM CHLORIDE 0.9 % IV SOLN
INTRAVENOUS | Status: DC
  Administered 2016-10-11: 06:00:00 via INTRAVENOUS

## 2016-10-10 MED ORDER — ASPIRIN 81 MG PO CHEW: 81.0000 mg | CHEWABLE_TABLET | ORAL | Status: DC

## 2016-10-10 MED ORDER — FUROSEMIDE 20 MG PO TABS
20.0000 mg | ORAL_TABLET | Freq: Every day | ORAL | Status: DC
  Administered 2016-10-12 – 2016-10-14 (×3): 20 mg via ORAL
  Filled 2016-10-10 (×3): qty 1

## 2016-10-10 MED ORDER — FUROSEMIDE 20 MG PO TABS
20.0000 mg | ORAL_TABLET | Freq: Every day | ORAL | Status: DC
  Administered 2016-10-10: 20 mg via ORAL

## 2016-10-10 MED ORDER — SODIUM CHLORIDE 0.9 % IV SOLN: 250.0000 mL | INTRAVENOUS | Status: DC | PRN

## 2016-10-10 MED ORDER — SPIRONOLACTONE 25 MG PO TABS
12.5000 mg | ORAL_TABLET | Freq: Every day | ORAL | Status: DC
  Administered 2016-10-10 – 2016-10-14 (×5): 12.5 mg via ORAL
  Filled 2016-10-10 (×5): qty 1

## 2016-10-10 MED ORDER — SODIUM CHLORIDE 0.9% FLUSH
3.0000 mL | Freq: Two times a day (BID) | INTRAVENOUS | Status: DC
  Administered 2016-10-11: 3 mL via INTRAVENOUS

## 2016-10-10 MED ORDER — SODIUM CHLORIDE 0.9% FLUSH: 3.0000 mL | INTRAVENOUS | Status: DC | PRN

## 2016-10-10 NOTE — Progress Notes (Signed)
Patient ID: Tyler Chavez, male   DOB: [birth year], 66 y.o.   MRN: [id number]   SUBJECTIVE: No complaints this morning, no chest pain or dyspnea.  Had some mild dyspnea with walk yesterday.  Got IV Lasix yesterday, weight down 4 lbs.     CXR: Left-sided infiltrate.  Echo: EF 25-30%, regional wall motion abnormalities.  LHC: Proximal LAD occluded, 75% prox LCx, 90% proximal RCA, 95% PLV => DES to pLAD.   Scheduled Meds: . aspirin  81 mg Oral Daily  . atorvastatin  80 mg Oral q1800  . azithromycin  250 mg Oral Daily  . carvedilol  3.125 mg Oral BID WC  . cefTRIAXone (ROCEPHIN)  IV  1 g Intravenous Q24H  . furosemide  20 mg Oral Daily  . heparin subcutaneous  5,000 Units Subcutaneous Q8H  . lisinopril  2.5 mg Oral Daily  . sodium chloride flush  3 mL Intravenous Q12H  . spironolactone  12.5 mg Oral Daily  . ticagrelor  90 mg Oral BID   Continuous Infusions:  PRN Meds:.sodium chloride, acetaminophen, ALPRAZolam, ondansetron (ZOFRAN) IV, sodium chloride flush    Vitals:   10/10/16 0005 10/10/16 0341 10/10/16 0700 10/10/16 0941  BP: (!) 153/127 100/63 102/65 101/61  Pulse: 90 88 79   Resp: (!) 24 16 (!) 23   Temp: 98.6 F (37 C) 98.9 F (37.2 C) 98.6 F (37 C)   TempSrc: Oral Oral Oral   SpO2: 98% 97% 100%   Weight:      Height:        Intake/Output Summary (Last 24 hours) at 10/10/16 0943 Last data filed at 10/10/16 0800  Gross per 24 hour  Intake              530 ml  Output             1300 ml  Net             -770 ml    LABS: Basic Metabolic Panel:  Recent Labs  10/09/16 0353 10/10/16 0256  NA 140 138  K 3.3* 3.5  CL 104 105  CO2 27 23  GLUCOSE 102* 106*  BUN 12 21*  CREATININE 1.04 1.09  CALCIUM 8.8* 8.9   Liver Function Tests: No results for input(s): AST, ALT, ALKPHOS, BILITOT, PROT, ALBUMIN in the last 72 hours. No results for input(s): LIPASE, AMYLASE in the last 72 hours. CBC:  Recent Labs  10/09/16 0353 10/10/16 0256  WBC 7.4 7.4  HGB 13.9  13.8  HCT 40.8 41.5  MCV 90.3 92.2  PLT 216 195   Cardiac Enzymes:  Recent Labs  10/07/16 1509 10/08/16 0321  TROPONINI >65.00* >65.00*   BNP: Invalid input(s): POCBNP D-Dimer: No results for input(s): DDIMER in the last 72 hours. Hemoglobin A1C:  Recent Labs  10/08/16 0321  HGBA1C 4.9   Fasting Lipid Panel: No results for input(s): CHOL, HDL, LDLCALC, TRIG, CHOLHDL, LDLDIRECT in the last 72 hours. Thyroid Function Tests: No results for input(s): TSH, T4TOTAL, T3FREE, THYROIDAB in the last 72 hours.  Invalid input(s): FREET3 Anemia Panel: No results for input(s): VITAMINB12, FOLATE, FERRITIN, TIBC, IRON, RETICCTPCT in the last 72 hours.  RADIOLOGY: Dg Chest Port 1 View  Result Date: 10/08/2016 CLINICAL DATA:  Shortness of breath . EXAM: PORTABLE CHEST 1 VIEW COMPARISON:  No prior . FINDINGS: Mediastinum hilar structures are normal. Diffuse left lung infiltrate. No pleural effusion or pneumothorax. Cardiomegaly with normal pulmonary vascularity. No acute bony abnormality P IMPRESSION: Diffuse left  lung infiltrate consistent with pneumonia . Electronically Signed   By: Marcello Moores  Register   On: 10/08/2016 12:10    PHYSICAL EXAM General: NAD Neck: No JVD, no thyromegaly or thyroid nodule.  Lungs: Crackles on left CV: Nondisplaced PMI.  Heart regular S1/S2, no S3/S4, no murmur.  No peripheral edema.  No carotid bruit.  Normal pedal pulses.  Abdomen: Soft, nontender, no hepatosplenomegaly, no distention.  Neurologic: Alert and oriented x 3.  Psych: Normal affect. Extremities: No clubbing or cyanosis.   TELEMETRY: Reviewed telemetry pt in NSR  ASSESSMENT AND PLAN: 66 yo with history of HTN presented with anterior STEMI.  1. CAD: Anterior STEMI.  S/p DES to occluded proximal LAD.  He has residual disease in the LCx, RCA, PLV.  No chest pain.  - Would plan staged PCI to LCx, RCA system on Monday.  - Continue ASA, ticagrelor, high dose statin.  2. Acute systolic CHF: EF  [id number] on echo this admission, ischemic cardiomyopathy.  Breathing much better after good diuresis with IV Lasix.  Looks euvolemic on exam today.  SBP in 100s.  - Lasix 20 mg po today, hold tomorrow morning prior to cath.   - Continue Coreg 3.125 mg bid and lisinopril 2.5 daily.  - Can add spironolactone 12.5 daily. - He will need Lifevest at discharge.  3. PNA: Left-sided infiltrate, afebrile with normal WBCs.  Treating with ceftriaxone/azithromycin.   Loralie Champagne 10/10/2016 9:43 AM

## 2016-10-11 ENCOUNTER — Encounter (HOSPITAL_COMMUNITY)
Admission: AD | Disposition: A | Discharge status: Home / Self Care | Payer: Self-pay | Source: Ambulatory Visit | Attending: Cardiovascular Disease

## 2016-10-11 DIAGNOSIS — I519 Heart disease, unspecified: Secondary | ICD-10-CM

## 2016-10-11 HISTORY — PX: CARDIAC CATHETERIZATION: SHX172

## 2016-10-11 LAB — POCT ACTIVATED CLOTTING TIME
Activated Clotting Time: 268 seconds
Activated Clotting Time: 329 seconds

## 2016-10-11 LAB — PROTIME-INR
INR: 1.02
Prothrombin Time: 13.4 seconds (ref 11.4–15.2)

## 2016-10-11 LAB — CBC
HCT: 41 % (ref 39.0–52.0)
HEMOGLOBIN: 13.7 g/dL (ref 13.0–17.0)
MCH: 30.9 pg (ref 26.0–34.0)
MCHC: 33.4 g/dL (ref 30.0–36.0)
MCV: 92.3 fL (ref 78.0–100.0)
Platelets: 201 10*3/uL (ref 150–400)
RBC: 4.44 MIL/uL (ref 4.22–5.81)
RDW: 14 % (ref 11.5–15.5)
WBC: 6 10*3/uL (ref 4.0–10.5)

## 2016-10-11 LAB — BASIC METABOLIC PANEL
ANION GAP: 11 (ref 5–15)
BUN: 21 mg/dL — ABNORMAL HIGH (ref 6–20)
CALCIUM: 9.1 mg/dL (ref 8.9–10.3)
CHLORIDE: 105 mmol/L (ref 101–111)
CO2: 24 mmol/L (ref 22–32)
Creatinine, Ser: 1.02 mg/dL (ref 0.61–1.24)
GFR calc Af Amer: >60 mL/min (ref >60)
GFR calc non Af Amer: >60 mL/min (ref >60)
GLUCOSE: 102 mg/dL — AB (ref 65–99)
POTASSIUM: 3.6 mmol/L (ref 3.5–5.1)
Sodium: 140 mmol/L (ref 135–145)

## 2016-10-11 SURGERY — CORONARY STENT INTERVENTION
Anesthesia: LOCAL

## 2016-10-11 MED ORDER — MIDAZOLAM HCL 2 MG/2ML IJ SOLN
INTRAMUSCULAR | Status: AC
  Filled 2016-10-11: qty 2

## 2016-10-11 MED ORDER — SODIUM CHLORIDE 0.9% FLUSH: 3.0000 mL | INTRAVENOUS | Status: DC | PRN

## 2016-10-11 MED ORDER — SODIUM CHLORIDE 0.9 % WEIGHT BASED INFUSION
1.0000 mL/kg/h | INTRAVENOUS | Status: AC
  Administered 2016-10-11: 1 mL/kg/h via INTRAVENOUS

## 2016-10-11 MED ORDER — HEART ATTACK BOUNCING BOOK
Freq: Once | Status: AC
  Administered 2016-10-11: 20:00:00
  Filled 2016-10-11: qty 1

## 2016-10-11 MED ORDER — HEPARIN (PORCINE) IN NACL 2-0.9 UNIT/ML-% IJ SOLN
INTRAMUSCULAR | Status: DC | PRN
  Administered 2016-10-11: 1000 mL

## 2016-10-11 MED ORDER — FENTANYL CITRATE (PF) 100 MCG/2ML IJ SOLN
INTRAMUSCULAR | Status: AC
  Filled 2016-10-11: qty 2

## 2016-10-11 MED ORDER — HEPARIN SODIUM (PORCINE) 5000 UNIT/ML IJ SOLN
5000.0000 [IU] | Freq: Three times a day (TID) | INTRAMUSCULAR | Status: DC
  Administered 2016-10-12 – 2016-10-14 (×7): 5000 [IU] via SUBCUTANEOUS
  Filled 2016-10-11 (×7): qty 1

## 2016-10-11 MED ORDER — FENTANYL CITRATE (PF) 100 MCG/2ML IJ SOLN
INTRAMUSCULAR | Status: DC | PRN
  Administered 2016-10-11: 25 ug via INTRAVENOUS

## 2016-10-11 MED ORDER — IOPAMIDOL (ISOVUE-370) INJECTION 76%
INTRAVENOUS | Status: DC | PRN
  Administered 2016-10-11: 135 mL via INTRA_ARTERIAL

## 2016-10-11 MED ORDER — HEPARIN SODIUM (PORCINE) 1000 UNIT/ML IJ SOLN
INTRAMUSCULAR | Status: DC | PRN
  Administered 2016-10-11: 2000 [IU] via INTRAVENOUS
  Administered 2016-10-11: 8000 [IU] via INTRAVENOUS

## 2016-10-11 MED ORDER — LIDOCAINE HCL (PF) 1 % IJ SOLN
INTRAMUSCULAR | Status: AC
  Filled 2016-10-11: qty 30

## 2016-10-11 MED ORDER — NITROGLYCERIN 1 MG/10 ML FOR IR/CATH LAB
INTRA_ARTERIAL | Status: AC
  Filled 2016-10-11: qty 10

## 2016-10-11 MED ORDER — ANGIOPLASTY BOOK
Freq: Once | Status: AC
  Administered 2016-10-11: 20:00:00
  Filled 2016-10-11: qty 1

## 2016-10-11 MED ORDER — HEPARIN (PORCINE) IN NACL 2-0.9 UNIT/ML-% IJ SOLN
INTRAMUSCULAR | Status: AC
  Filled 2016-10-11: qty 1000

## 2016-10-11 MED ORDER — VERAPAMIL HCL 2.5 MG/ML IV SOLN
INTRAVENOUS | Status: AC
  Filled 2016-10-11: qty 2

## 2016-10-11 MED ORDER — VERAPAMIL HCL 2.5 MG/ML IV SOLN
INTRAVENOUS | Status: DC | PRN
  Administered 2016-10-11: 8 mL via INTRA_ARTERIAL

## 2016-10-11 MED ORDER — SODIUM CHLORIDE 0.9 % IV SOLN: 250.0000 mL | INTRAVENOUS | Status: DC | PRN

## 2016-10-11 MED ORDER — HEPARIN SODIUM (PORCINE) 1000 UNIT/ML IJ SOLN
INTRAMUSCULAR | Status: AC
  Filled 2016-10-11: qty 1

## 2016-10-11 MED ORDER — SODIUM CHLORIDE 0.9% FLUSH
3.0000 mL | Freq: Two times a day (BID) | INTRAVENOUS | Status: DC
  Administered 2016-10-12: 11:00:00 10 mL via INTRAVENOUS
  Administered 2016-10-13: 3 mL via INTRAVENOUS

## 2016-10-11 MED ORDER — LIDOCAINE HCL (PF) 1 % IJ SOLN
INTRAMUSCULAR | Status: DC | PRN
  Administered 2016-10-11: 2 mL

## 2016-10-11 MED ORDER — MIDAZOLAM HCL 2 MG/2ML IJ SOLN
INTRAMUSCULAR | Status: DC | PRN
  Administered 2016-10-11: 2 mg via INTRAVENOUS

## 2016-10-11 MED ORDER — IOPAMIDOL (ISOVUE-370) INJECTION 76%
INTRAVENOUS | Status: AC
  Filled 2016-10-11: qty 125

## 2016-10-11 SURGICAL SUPPLY — 20 items
BALLN MOZEC 2.0X12 (BALLOONS) ×2
BALLN ~~LOC~~ EUPHORA RX 2.5X12 (BALLOONS) ×2
BALLN ~~LOC~~ EUPHORA RX 3.75X8 (BALLOONS) ×2
BALLOON MOZEC 2.0X12 (BALLOONS) ×1 IMPLANT
BALLOON ~~LOC~~ EUPHORA RX 2.5X12 (BALLOONS) ×1 IMPLANT
BALLOON ~~LOC~~ EUPHORA RX 3.75X8 (BALLOONS) ×1 IMPLANT
CATH VISTA GUIDE 6FR JR4 (CATHETERS) ×2 IMPLANT
DEVICE RAD COMP TR BAND LRG (VASCULAR PRODUCTS) ×2 IMPLANT
GLIDESHEATH SLEND SS 6F .021 (SHEATH) ×2 IMPLANT
GUIDE CATH RUNWAY 6FR CLS3.5 (CATHETERS) ×2 IMPLANT
GUIDEWIRE INQWIRE 1.5J.035X260 (WIRE) ×1 IMPLANT
INQWIRE 1.5J .035X260CM (WIRE) ×2
KIT ENCORE 26 ADVANTAGE (KITS) ×2 IMPLANT
KIT HEART LEFT (KITS) ×4 IMPLANT
PACK CARDIAC CATHETERIZATION (CUSTOM PROCEDURE TRAY) ×2 IMPLANT
STENT RESOLUTE ONYX 2.25X15 (Permanent Stent) ×2 IMPLANT
STENT RESOLUTE ONYX 3.5X12 (Permanent Stent) ×2 IMPLANT
TRANSDUCER W/STOPCOCK (MISCELLANEOUS) ×2 IMPLANT
TUBING CIL FLEX 10 FLL-RA (TUBING) ×2 IMPLANT
WIRE ASAHI PROWATER 180CM (WIRE) ×2 IMPLANT

## 2016-10-11 NOTE — H&P (View-Only) (Signed)
Subjective:  POD # 4 Ant STEMI Rx with prox LAD PCI/DES by Dr Leda Gauze. Residual LCX/RCA Dx. Severe LV dysfunction. No CP/SOB. For staged PCI today  Objective:  Temp:  [97.6 F (36.4 C)-98.8 F (37.1 C)] 98.2 F (36.8 C) (01/15 0754) Pulse Rate:  [84-89] 84 (01/15 0445) Resp:  [15-26] 20 (01/15 0754) BP: (96-116)/(61-88) 97/77 (01/15 0754) SpO2:  [98 %-100 %] 98 % (01/15 0754) Weight change:   Intake/Output from previous day: 01/14 0701 - 01/15 0700 In: 530 [P.O.:480; IV Piggyback:50] Out: 650 [Urine:650]  Intake/Output from this shift: No intake/output data recorded.  Physical Exam: General appearance: alert and no distress Neck: no adenopathy, no carotid bruit, no JVD, supple, symmetrical, trachea midline and thyroid not enlarged, symmetric, no tenderness/mass/nodules Lungs: clear to auscultation bilaterally Heart: regular rate and rhythm, S1, S2 normal, no murmur, click, rub or gallop Extremities: extremities normal, atraumatic, no cyanosis or edema and Right radial puncture OK  Lab Results: Results for orders placed or performed during the hospital encounter of 10/07/16 (from the past 48 hour(s))  Basic metabolic panel     Status: Abnormal   Collection Time: 10/10/16  2:56 AM  Result Value Ref Range   Sodium 138 135 - 145 mmol/L   Potassium 3.5 3.5 - 5.1 mmol/L    Comment: HEMOLYSIS AT THIS LEVEL MAY AFFECT RESULT   Chloride 105 101 - 111 mmol/L   CO2 23 22 - 32 mmol/L   Glucose, Bld 106 (H) 65 - 99 mg/dL   BUN 21 (H) 6 - 20 mg/dL   Creatinine, Ser 1.09 0.61 - 1.24 mg/dL   Calcium 8.9 8.9 - 10.3 mg/dL   GFR calc non Af Amer >60 >60 mL/min   GFR calc Af Amer >60 >60 mL/min    Comment: (NOTE) The eGFR has been calculated using the CKD EPI equation. This calculation has not been validated in all clinical situations. eGFR's persistently <60 mL/min signify possible Chronic Kidney Disease.    Anion gap 10 5 - 15  CBC     Status: None   Collection Time: 10/10/16   2:56 AM  Result Value Ref Range   WBC 7.4 4.0 - 10.5 K/uL   RBC 4.50 4.22 - 5.81 MIL/uL   Hemoglobin 13.8 13.0 - 17.0 g/dL   HCT 41.5 39.0 - 52.0 %   MCV 92.2 78.0 - 100.0 fL   MCH 30.7 26.0 - 34.0 pg   MCHC 33.3 30.0 - 36.0 g/dL   RDW 14.4 11.5 - 15.5 %   Platelets 195 150 - 400 K/uL  Basic metabolic panel     Status: Abnormal   Collection Time: 10/11/16  2:55 AM  Result Value Ref Range   Sodium 140 135 - 145 mmol/L   Potassium 3.6 3.5 - 5.1 mmol/L   Chloride 105 101 - 111 mmol/L   CO2 24 22 - 32 mmol/L   Glucose, Bld 102 (H) 65 - 99 mg/dL   BUN 21 (H) 6 - 20 mg/dL   Creatinine, Ser 1.02 0.61 - 1.24 mg/dL   Calcium 9.1 8.9 - 10.3 mg/dL   GFR calc non Af Amer >60 >60 mL/min   GFR calc Af Amer >60 >60 mL/min    Comment: (NOTE) The eGFR has been calculated using the CKD EPI equation. This calculation has not been validated in all clinical situations. eGFR's persistently <60 mL/min signify possible Chronic Kidney Disease.    Anion gap 11 5 - 15  CBC  Status: None   Collection Time: 10/11/16  2:55 AM  Result Value Ref Range   WBC 6.0 4.0 - 10.5 K/uL   RBC 4.44 4.22 - 5.81 MIL/uL   Hemoglobin 13.7 13.0 - 17.0 g/dL   HCT 41.0 39.0 - 52.0 %   MCV 92.3 78.0 - 100.0 fL   MCH 30.9 26.0 - 34.0 pg   MCHC 33.4 30.0 - 36.0 g/dL   RDW 14.0 11.5 - 15.5 %   Platelets 201 150 - 400 K/uL  Protime-INR     Status: None   Collection Time: 10/11/16  2:55 AM  Result Value Ref Range   Prothrombin Time 13.4 11.4 - 15.2 seconds   INR 1.02     Imaging: Imaging results have been reviewed  Tele- NSR w/o arrhythmias ( I personally reviewed)  Assessment/Plan:   1. Active Problems: 2.   STEMI involving left anterior descending coronary artery ([place]) 3.   Acute ST elevation myocardial infarction (STEMI) involving left anterior descending (LAD) coronary artery ([place]) 4.   Time Spent Directly with Patient:  20 minutes  Length of Stay:  LOS: 4 days   POD # 4 Ant STEMI with residual  LCX/RCA Dx S/P PAD/ DES. Severe LV dysfunction (EF 25%). No CP/SOB. On DAPT. Exam benign. For staged intervention today.   Quay Burow 10/11/2016, 8:20 AM

## 2016-10-11 NOTE — Progress Notes (Signed)
CARDIAC REHAB PHASE I   PRE:  Rate/Rhythm: 89 SR    BP: sitting 98/67    SaO2: 96 RA  MODE:  Ambulation: 260 ft   POST:  Rate/Rhythm: 98 SR with PVC    BP: sitting 118/62     SaO2: 98 RA  Pt sts he is somewhat weak and gets tired. Fairly steady. To recliner. Discussed MI, stent, Brilinta, restrictions, Lifevest, remaining smoke free, low sodium. Voiced understanding. Encouraged him to read materials and watch videos. Will f/u tomorrow. [id number]   Darrick Meigs CES, ACSM 10/11/2016 10:50 AM

## 2016-10-11 NOTE — Progress Notes (Signed)
Subjective:  POD # 4 Ant STEMI Rx with prox LAD PCI/DES by Dr Leda Gauze. Residual LCX/RCA Dx. Severe LV dysfunction. No CP/SOB. For staged PCI today  Objective:  Temp:  [97.6 F (36.4 C)-98.8 F (37.1 C)] 98.2 F (36.8 C) (01/15 0754) Pulse Rate:  [84-89] 84 (01/15 0445) Resp:  [15-26] 20 (01/15 0754) BP: (96-116)/(61-88) 97/77 (01/15 0754) SpO2:  [98 %-100 %] 98 % (01/15 0754) Weight change:   Intake/Output from previous day: 01/14 0701 - 01/15 0700 In: 530 [P.O.:480; IV Piggyback:50] Out: 650 [Urine:650]  Intake/Output from this shift: No intake/output data recorded.  Physical Exam: General appearance: alert and no distress Neck: no adenopathy, no carotid bruit, no JVD, supple, symmetrical, trachea midline and thyroid not enlarged, symmetric, no tenderness/mass/nodules Lungs: clear to auscultation bilaterally Heart: regular rate and rhythm, S1, S2 normal, no murmur, click, rub or gallop Extremities: extremities normal, atraumatic, no cyanosis or edema and Right radial puncture OK  Lab Results: Results for orders placed or performed during the hospital encounter of 10/07/16 (from the past 48 hour(s))  Basic metabolic panel     Status: Abnormal   Collection Time: 10/10/16  2:56 AM  Result Value Ref Range   Sodium 138 135 - 145 mmol/L   Potassium 3.5 3.5 - 5.1 mmol/L    Comment: HEMOLYSIS AT THIS LEVEL MAY AFFECT RESULT   Chloride 105 101 - 111 mmol/L   CO2 23 22 - 32 mmol/L   Glucose, Bld 106 (H) 65 - 99 mg/dL   BUN 21 (H) 6 - 20 mg/dL   Creatinine, Ser 1.09 0.61 - 1.24 mg/dL   Calcium 8.9 8.9 - 10.3 mg/dL   GFR calc non Af Amer >60 >60 mL/min   GFR calc Af Amer >60 >60 mL/min    Comment: (NOTE) The eGFR has been calculated using the CKD EPI equation. This calculation has not been validated in all clinical situations. eGFR's persistently <60 mL/min signify possible Chronic Kidney Disease.    Anion gap 10 5 - 15  CBC     Status: None   Collection Time: 10/10/16   2:56 AM  Result Value Ref Range   WBC 7.4 4.0 - 10.5 K/uL   RBC 4.50 4.22 - 5.81 MIL/uL   Hemoglobin 13.8 13.0 - 17.0 g/dL   HCT 41.5 39.0 - 52.0 %   MCV 92.2 78.0 - 100.0 fL   MCH 30.7 26.0 - 34.0 pg   MCHC 33.3 30.0 - 36.0 g/dL   RDW 14.4 11.5 - 15.5 %   Platelets 195 150 - 400 K/uL  Basic metabolic panel     Status: Abnormal   Collection Time: 10/11/16  2:55 AM  Result Value Ref Range   Sodium 140 135 - 145 mmol/L   Potassium 3.6 3.5 - 5.1 mmol/L   Chloride 105 101 - 111 mmol/L   CO2 24 22 - 32 mmol/L   Glucose, Bld 102 (H) 65 - 99 mg/dL   BUN 21 (H) 6 - 20 mg/dL   Creatinine, Ser 1.02 0.61 - 1.24 mg/dL   Calcium 9.1 8.9 - 10.3 mg/dL   GFR calc non Af Amer >60 >60 mL/min   GFR calc Af Amer >60 >60 mL/min    Comment: (NOTE) The eGFR has been calculated using the CKD EPI equation. This calculation has not been validated in all clinical situations. eGFR's persistently <60 mL/min signify possible Chronic Kidney Disease.    Anion gap 11 5 - 15  CBC  Status: None   Collection Time: 10/11/16  2:55 AM  Result Value Ref Range   WBC 6.0 4.0 - 10.5 K/uL   RBC 4.44 4.22 - 5.81 MIL/uL   Hemoglobin 13.7 13.0 - 17.0 g/dL   HCT 41.0 39.0 - 52.0 %   MCV 92.3 78.0 - 100.0 fL   MCH 30.9 26.0 - 34.0 pg   MCHC 33.4 30.0 - 36.0 g/dL   RDW 14.0 11.5 - 15.5 %   Platelets 201 150 - 400 K/uL  Protime-INR     Status: None   Collection Time: 10/11/16  2:55 AM  Result Value Ref Range   Prothrombin Time 13.4 11.4 - 15.2 seconds   INR 1.02     Imaging: Imaging results have been reviewed  Tele- NSR w/o arrhythmias ( I personally reviewed)  Assessment/Plan:   1. Active Problems: 2.   STEMI involving left anterior descending coronary artery ([place]) 3.   Acute ST elevation myocardial infarction (STEMI) involving left anterior descending (LAD) coronary artery ([place]) 4.   Time Spent Directly with Patient:  20 minutes  Length of Stay:  LOS: 4 days   POD # 4 Ant STEMI with residual  LCX/RCA Dx S/P PAD/ DES. Severe LV dysfunction (EF 25%). No CP/SOB. On DAPT. Exam benign. For staged intervention today.   Quay Burow 10/11/2016, 8:20 AM

## 2016-10-11 NOTE — Interval H&P Note (Signed)
History and Physical Interval Note:  10/11/2016 2:46 PM  Tyler Chavez  has presented today for surgery, with the diagnosis of NSTEMI  The various methods of treatment have been discussed with the patient and family. After consideration of risks, benefits and other options for treatment, the patient has consented to  Procedure(s): Coronary Stent Intervention (N/A) as a surgical intervention .  The patient's history has been reviewed, patient examined, no change in status, stable for surgery.  I have reviewed the patient's chart and labs.  Questions were answered to the patient's satisfaction.   Cath Lab Visit (complete for each Cath Lab visit)  Clinical Evaluation Leading to the Procedure:   ACS: No.  Non-ACS:    Anginal Classification: CCS II  Anti-ischemic medical therapy: Minimal Therapy (1 class of medications)  Non-Invasive Test Results: No non-invasive testing performed  Prior CABG: No previous CABG        [facility] 10/11/2016 2:47 PM

## 2016-10-11 NOTE — Care Management Note (Signed)
Case Management Note  Patient Details  Name: Tyler Chavez MRN: [id number] Date of Birth: 1951/05/27  Subjective/Objective:     Adm w mi               Action/Plan: lives at home. Has bcbs ins for another month then goes on health team adv. Gave pt 30day free and copay card for brilinta.  Expected Discharge Date:                  Expected Discharge Plan:  Home/Self Care  In-House Referral:     Discharge planning Services  CM Consult, Medication Assistance  Post Acute Care Choice:    Choice offered to:     DME Arranged:    DME Agency:     HH Arranged:    HH Agency:     Status of Service:  Completed, signed off  If discussed at [employer] of Stay Meetings, dates discussed:    Additional Comments:bcbs ins at present.  Lacretia Leigh, RN 10/11/2016, 9:53 AM

## 2016-10-12 ENCOUNTER — Inpatient Hospital Stay (HOSPITAL_COMMUNITY): Payer: BLUE CROSS/BLUE SHIELD

## 2016-10-12 ENCOUNTER — Encounter (HOSPITAL_COMMUNITY): Payer: Self-pay | Admitting: Cardiology

## 2016-10-12 DIAGNOSIS — I2510 Atherosclerotic heart disease of native coronary artery without angina pectoris: Secondary | ICD-10-CM

## 2016-10-12 LAB — ECHOCARDIOGRAM LIMITED
CHL CUP DOP CALC LVOT VTI: 13.8 cm
CHL CUP MV DEC (S): 177
CHL CUP STROKE VOLUME: 30 mL
CHL CUP TV REG PEAK VELOCITY: 212 cm/s
E decel time: 177 msec
E/e' ratio: 10.61
FS: 24 % — AB (ref 28–44)
HEIGHTINCHES: 74 in
IV/PV OW: 1.11
LA diam end sys: 35 mm
LA diam index: 1.58 cm/m2
LASIZE: 35 mm
LV E/e'average: 10.61
LV PW d: 11 mm — AB (ref 0.6–1.1)
LV TDI E'MEDIAL: 5.57
LV e' LATERAL: 7.01 cm/s
LV sys vol: 52 mL (ref 21–61)
LVDIAVOL: 82 mL (ref 62–150)
LVDIAVOLIN: 37 mL/m2
LVEEMED: 10.61
LVOT area: 3.46 cm2
LVOTD: 21 mm
LVOTPV: 71.7 cm/s
LVOTSV: 48 mL
LVSYSVOLIN: 24 mL/m2
MV Peak grad: 2 mmHg
MV pk A vel: 83.1 m/s
MVPKEVEL: 74.4 m/s
RV sys press: 21 mmHg
Simpson's disk: 36
TDI e' lateral: 7.01
TR max vel: 212 cm/s
WEIGHTICAEL: 3333.36 [oz_av]

## 2016-10-12 LAB — CBC
HEMATOCRIT: 37.5 % — AB (ref 39.0–52.0)
Hemoglobin: 12.4 g/dL — ABNORMAL LOW (ref 13.0–17.0)
MCH: 30.3 pg (ref 26.0–34.0)
MCHC: 33.1 g/dL (ref 30.0–36.0)
MCV: 91.7 fL (ref 78.0–100.0)
PLATELETS: 215 10*3/uL (ref 150–400)
RBC: 4.09 MIL/uL — ABNORMAL LOW (ref 4.22–5.81)
RDW: 14.1 % (ref 11.5–15.5)
WBC: 5.2 10*3/uL (ref 4.0–10.5)

## 2016-10-12 LAB — BASIC METABOLIC PANEL
Anion gap: 12 (ref 5–15)
BUN: 17 mg/dL (ref 6–20)
CALCIUM: 8.9 mg/dL (ref 8.9–10.3)
CO2: 25 mmol/L (ref 22–32)
CREATININE: 0.96 mg/dL (ref 0.61–1.24)
Chloride: 101 mmol/L (ref 101–111)
GFR calc Af Amer: >60 mL/min (ref >60)
GFR calc non Af Amer: >60 mL/min (ref >60)
Glucose, Bld: 105 mg/dL — ABNORMAL HIGH (ref 65–99)
Potassium: 3.4 mmol/L — ABNORMAL LOW (ref 3.5–5.1)
SODIUM: 138 mmol/L (ref 135–145)

## 2016-10-12 MED FILL — Nitroglycerin IV Soln 100 MCG/ML in D5W: INTRA_ARTERIAL | Qty: 10 | Status: AC

## 2016-10-12 NOTE — Progress Notes (Signed)
CARDIAC REHAB PHASE I   PRE:  Rate/Rhythm: 80 SR    BP: sitting 112/59    SaO2: 98 2L  MODE:  Ambulation: 500 ft   POST:  Rate/Rhythm: 95 SR    BP: sitting 113/80     SaO2: 95 RA  Tolerated well for most of walk, steady. Pt sts he is tired and SOB upon sitting down. VSS. Ed completed with good comprehension. Able to perform teach back. Also sts his daughter in law will help him. Will refer to [place].  [id number]   Darrick Meigs CES, ACSM 10/12/2016 8:58 AM

## 2016-10-12 NOTE — Progress Notes (Signed)
Will place LifeVest at time of discharge.

## 2016-10-12 NOTE — Progress Notes (Signed)
Pt demonstrates very little CHF knowledge.  Education provided on meds given.  Watching CHF Video now.

## 2016-10-12 NOTE — Progress Notes (Signed)
  Echocardiogram 2D Echocardiogram has been performed.  Tyler Chavez 10/12/2016, 2:26 PM

## 2016-10-12 NOTE — Progress Notes (Signed)
Dr. Gwenlyn Found informed of SBP<100 through the night and Coreg not yet given this am.  MD advised to hold Coreg.   10/12/16 0507 10/12/16 0721 10/12/16 0818  Vitals  BP (!) 99/59 (!) 96/56 113/80  MAP (mmHg) 72 --  85

## 2016-10-12 NOTE — Progress Notes (Signed)
Subjective:  No CP/SOB. Day # 5 Ant STEMI Rx with Prox LAD PCI-DES by Dr Leda Gauze. POD # 1 staged RCA PCI- DES X 2 by Dr Shirlee More. Insignif LCX Dz. Severe LVD.   Objective:  Temp:  [98 F (36.7 C)-98.2 F (36.8 C)] 98.2 F (36.8 C) (01/16 0721) Pulse Rate:  [0-105] 75 (01/16 0721) Resp:  [9-111] 15 (01/16 0818) BP: (94-127)/(55-82) 113/80 (01/16 0818) SpO2:  [0 %-99 %] 98 % (01/16 0721) Weight:  [208 lb 5.4 oz (94.5 kg)] 208 lb 5.4 oz (94.5 kg) (01/16 0507) Weight change:   Intake/Output from previous day: 01/15 0701 - 01/16 0700 In: 567.3 [P.O.:200; I.V.:367.3] Out: 725 [Urine:725]  Intake/Output from this shift: No intake/output data recorded.  Physical Exam: General appearance: alert and no distress Neck: no adenopathy, no carotid bruit, no JVD, supple, symmetrical, trachea midline and thyroid not enlarged, symmetric, no tenderness/mass/nodules Lungs: clear to auscultation bilaterally Heart: regular rate and rhythm, S1, S2 normal, no murmur, click, rub or gallop Extremities: extremities normal, atraumatic, no cyanosis or edema  Lab Results: Results for orders placed or performed during the hospital encounter of 10/07/16 (from the past 48 hour(s))  Basic metabolic panel     Status: Abnormal   Collection Time: 10/11/16  2:55 AM  Result Value Ref Range   Sodium 140 135 - 145 mmol/L   Potassium 3.6 3.5 - 5.1 mmol/L   Chloride 105 101 - 111 mmol/L   CO2 24 22 - 32 mmol/L   Glucose, Bld 102 (H) 65 - 99 mg/dL   BUN 21 (H) 6 - 20 mg/dL   Creatinine, Ser 1.02 0.61 - 1.24 mg/dL   Calcium 9.1 8.9 - 10.3 mg/dL   GFR calc non Af Amer >60 >60 mL/min   GFR calc Af Amer >60 >60 mL/min    Comment: (NOTE) The eGFR has been calculated using the CKD EPI equation. This calculation has not been validated in all clinical situations. eGFR's persistently <60 mL/min signify possible Chronic Kidney Disease.    Anion gap 11 5 - 15  CBC     Status: None   Collection Time: 10/11/16  2:55 AM    Result Value Ref Range   WBC 6.0 4.0 - 10.5 K/uL   RBC 4.44 4.22 - 5.81 MIL/uL   Hemoglobin 13.7 13.0 - 17.0 g/dL   HCT 41.0 39.0 - 52.0 %   MCV 92.3 78.0 - 100.0 fL   MCH 30.9 26.0 - 34.0 pg   MCHC 33.4 30.0 - 36.0 g/dL   RDW 14.0 11.5 - 15.5 %   Platelets 201 150 - 400 K/uL  Protime-INR     Status: None   Collection Time: 10/11/16  2:55 AM  Result Value Ref Range   Prothrombin Time 13.4 11.4 - 15.2 seconds   INR 1.02   POCT Activated clotting time     Status: None   Collection Time: 10/11/16  3:10 PM  Result Value Ref Range   Activated Clotting Time 268 seconds  POCT Activated clotting time     Status: None   Collection Time: 10/11/16  3:29 PM  Result Value Ref Range   Activated Clotting Time 329 seconds  Basic metabolic panel     Status: Abnormal   Collection Time: 10/12/16  1:56 AM  Result Value Ref Range   Sodium 138 135 - 145 mmol/L   Potassium 3.4 (L) 3.5 - 5.1 mmol/L   Chloride 101 101 - 111 mmol/L   CO2 25  22 - 32 mmol/L   Glucose, Bld 105 (H) 65 - 99 mg/dL   BUN 17 6 - 20 mg/dL   Creatinine, Ser 0.96 0.61 - 1.24 mg/dL   Calcium 8.9 8.9 - 10.3 mg/dL   GFR calc non Af Amer >60 >60 mL/min   GFR calc Af Amer >60 >60 mL/min    Comment: (NOTE) The eGFR has been calculated using the CKD EPI equation. This calculation has not been validated in all clinical situations. eGFR's persistently <60 mL/min signify possible Chronic Kidney Disease.    Anion gap 12 5 - 15  CBC     Status: Abnormal   Collection Time: 10/12/16  1:56 AM  Result Value Ref Range   WBC 5.2 4.0 - 10.5 K/uL   RBC 4.09 (L) 4.22 - 5.81 MIL/uL   Hemoglobin 12.4 (L) 13.0 - 17.0 g/dL   HCT 37.5 (L) 39.0 - 52.0 %   MCV 91.7 78.0 - 100.0 fL   MCH 30.3 26.0 - 34.0 pg   MCHC 33.1 30.0 - 36.0 g/dL   RDW 14.1 11.5 - 15.5 %   Platelets 215 150 - 400 K/uL    Imaging: Imaging results have been reviewed  Tele- NSR with PVCs   Assessment/Plan:   1. Active Problems: 2.   STEMI involving left anterior  descending coronary artery ([place]) 3.   Acute ST elevation myocardial infarction (STEMI) involving left anterior descending (LAD) coronary artery ([place]) 4.   Time Spent Directly with Patient:  30 minutes  Length of Stay:  LOS: 5 days   POD # 5 Ant Stemi Rx with PCI-DES prox LAD by Dr Leda Gauze. Severe LVD with EF 25% (1/12). Staged RCA PCI-DES X 2 by Dr Shirlee More yesterday radially. Looks good. No CP/SOB. On approp meds. Labs OK. Will re check 2D echo for EF. If Still in the 30% range will need LifeVest. On approp meds.   Quay Burow 10/12/2016, 10:14 AM

## 2016-10-12 NOTE — Care Management Note (Addendum)
Case Management Note  Patient Details  Name: Tyler Chavez MRN: [id number] Date of Birth: [birth year]  Subjective/Objective:    S/p stent intervention, patient has the Brilinta 30 day savings card, he states he has BCBS inurance now and in Feb will start with [employer] advantage and Medicaid.  NCM informed patient to get his 30 day free filled then, before he runs out of the 30 day to go to the pharmacy with his new insurance and have them run the card to see what his co pay will be, if it is too high ,he will need to let his cardiologist know and also his cardiologist has samples of the brilinta in the office.  Patient states he understands. [facility] [place], they do have brilinta in [place]. One bottle left.  Patient  Is indep pta.  No other needs.  NCM [facility] and they have instock as well. Patient will need zoll vest at dc, Berneta Sages with [facility] is aware ,he has all the paper work he needs.              Action/Plan:   Expected Discharge Date:                  Expected Discharge Plan:  Home/Self Care  In-House Referral:     Discharge planning Services  CM Consult, Medication Assistance  Post Acute Care Choice:    Choice offered to:     DME Arranged:    DME Agency:     HH Arranged:    HH Agency:     Status of Service:  Completed, signed off  If discussed at [employer] of Stay Meetings, dates discussed:    Additional Comments:  Zenon Mayo, RN 10/12/2016, 11:02 AM

## 2016-10-13 MED ORDER — TICAGRELOR 90 MG PO TABS: 90.0000 mg | ORAL_TABLET | Freq: Two times a day (BID) | ORAL | 12 refills | Status: DC

## 2016-10-13 NOTE — Discharge Summary (Addendum)
Discharge Summary    Patient ID: Tyler Chavez,  MRN: [id number], DOB/AGE: Jun 19, 1951 66 y.o.  Admit date: 10/07/2016 Discharge date: 10/14/2016  Primary Care Provider: Glo Herring Primary Cardiologist: New, Dr. Claiborne Billings   Discharge Diagnoses    Active Problems:   STEMI involving left anterior descending coronary artery [facility])   Acute ST elevation myocardial infarction (STEMI) involving left anterior descending (LAD) coronary artery [facility])   Status post coronary artery stent placement   Allergies No Known Allergies  Diagnostic Studies/Procedures    Coronary Stent Intervention  Left Heart Cath and Coronary Angiography 10/07/16   Prox RCA lesion, 90 %stenosed.  Ost Cx to Prox Cx lesion, 75 %stenosed.  Post Atrio lesion, 95 %stenosed.  A STENT RESOLUTE ONYX 4.0X15 drug eluting stent was successfully placed.  Ost LAD lesion, 100 %stenosed.  Post intervention, there is a 0% residual stenosis.   Acute anterior wall ST segment elevation myocardial infarction secondary to total ostial occlusion of the LAD.  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  Successful  PCI to the totally occluded LAD at the ostium with ultimate insertion of a 4.015 mm Resolute Onyx DES stent postdilated to 4.10 mm with the 100% occlusion reduced to 0% and TIMI 0 flow being improved TIMI-3 flow.  Hypotension/cardiogenic shock with acute ischemic cardiomyopathy requiring dopamine/fluid resuscitation.  LVEDP 22.  RECOMMENDATION: The patient will need to continue DAPT for minimal pf a year and probably indefinitely.  He was transported to the CCU pain-free on dopamine at 5 mcg/kg/min.  He will require staged PCI to his RCA and circumflex lesions prior to hospital discharge if stable.  Will need aggressive medical therapy including ACE-I/ARB, probable aldosterone blockade with high likelihood  of post-MI CHF, high potency statin therapy, and initiation of carvedilol once off pressor therapy.  Plan echo Doppler study.  Transthoracic Echocardiography 10/08/16 Study Conclusions  - Left ventricle: Septal apical and anterior wall hypokinesis The   cavity size was mildly dilated. Wall thickness was increased in a   pattern of mild LVH. Systolic function was severely reduced. The   estimated ejection fraction was in the range of 25% to 30%. - Atrial septum: No defect or patent foramen ovale was identified  Coronary Stent Intervention 10/11/16  Ost Cx to Prox Cx lesion, 40 %stenosed.  Post Atrio lesion, 95 %stenosed.  A STENT RESOLUTE ONYX [id number] drug eluting stent was successfully placed.  Post intervention, there is a 0% residual stenosis.  Prox RCA lesion, 90 %stenosed.  A STENT RESOLUTE ONYX 3.5X12 drug eluting stent was successfully placed.  Post intervention, there is a 0% residual stenosis.   1. Successful stenting of the PLOM with a DES 2. Successful stenting of the proximal RCA with a DES 3. Nonobstructive disease in the LCx 4. Patent LAD stent  Plan: DAPT for at least one year.   Transthoracic Echocardiography 10/12/16 Study Conclusions  - Left ventricle: The cavity size was normal. Wall thickness was   increased in a pattern of mild LVH. Systolic function was   moderately to severely reduced. The estimated ejection fraction   was in the range of 30% to 35%. Features are consistent with a   pseudonormal left ventricular filling pattern, with concomitant   abnormal relaxation and increased filling pressure (grade 2   diastolic dysfunction).  _____________   History of Present Illness     Tyler Chavez is  a 66 year old male with a past medical history of HTN, HLD, and prostate CA. He presented as an anterior STEMI on 10/07/16.  Mr. Jeanpierre began having chest pain on the night of 10/06/16. He thought he was having some indigestion pain as he had a burning  sensation in his mid chest. The pain gradually intensified and was eventually associated with SOB and left arm pain. He was taken to [facility] ED.   EKG at [facility] showed anterior ST elevation with reciprocal depression in the inferior leads. He was taken urgently to [facility] cath lab as a code STEMI.   He tells me that he does not have a history of DM or CVA. His mother had a MI when he was very young.    Hospital Course     1. Anterior STEMI: Day #6 s/p DES to proximal LAD. Returned to the cath lab on 10/11/16 for staged PCI to RCA - got DES x 2.   Echo yesterday showed EF of 99991111, grade 2 diastolic dysfunction.   Continue Coreg 3.125mg  BID, lisinopril 2.5mg  daily, and spiro 12.5mg  daily.   Also on DAPT with ASA and Brilinta. Order placed for LifeVest.   2. HTN: Well controlled. Continue current regimen.   3. HLD: Continue high intensity statin. Will need LFT and FLP in 6 weeks  He was fitted for lifevest prior to discharge.  _____________  Discharge Vitals Blood pressure (!) 93/55, pulse 84, temperature 98.6 F (37 C), temperature source Oral, resp. rate 20, height 6\' 2"  (1.88 m), weight 200 lb 13.4 oz (91.1 kg), SpO2 100 %.  Filed Weights   10/12/16 0507 10/13/16 0706 10/14/16 0517  Weight: 208 lb 5.4 oz (94.5 kg) 205 lb 9.6 oz (93.3 kg) 200 lb 13.4 oz (91.1 kg)    Labs & Radiologic Studies     CBC  Recent Labs  10/12/16 0156  WBC 5.2  HGB 12.4*  HCT 37.5*  MCV 91.7  PLT [id number]   Basic Metabolic Panel  Recent Labs  10/12/16 0156  NA 138  K 3.4*  CL 101  CO2 25  GLUCOSE 105*  BUN 17  CREATININE 0.96  CALCIUM 8.9    Dg Chest Port 1 View  Result Date: 10/08/2016 CLINICAL DATA:  Shortness of breath . EXAM: PORTABLE CHEST 1 VIEW COMPARISON:  No prior . FINDINGS: Mediastinum hilar structures are normal. Diffuse left lung infiltrate. No pleural effusion or pneumothorax. Cardiomegaly with normal pulmonary vascularity. No acute bony abnormality P  IMPRESSION: Diffuse left lung infiltrate consistent with pneumonia . Electronically Signed   By: Marcello Moores  Register   On: 10/08/2016 12:10    GEN: Well nourished, well developed, in no acute distress.  HEENT: Grossly normal.  Neck: Supple, no JVD, carotid bruits, or masses. Cardiac: RRR, no murmurs, rubs, or gallops. No clubbing, cyanosis, edema.  Radials/DP/PT 2+ and equal bilaterally.  Respiratory:  Respirations regular and unlabored, clear to auscultation bilaterally. GI: Soft, nontender, nondistended, BS + x 4. MS: no deformity or atrophy. Skin: warm and dry, no rash. Neuro:  Strength and sensation are intact. Psych: AAOx3.  Normal affect   Disposition   Pt is being discharged home today in good condition.  Follow-up Plans & Appointments   Our office will call you to schedule an appt. To be seen in the next 7-10 days.  Follow-up [place], [place] Follow up on 10/21/2016.   Specialties:  Physician Assistant, Cardiology Why:  at 10:00 for hospital  follow up.  Contact information: [address] [place] [address] [phone]          Discharge Instructions    Amb Referral to Cardiac Rehabilitation    Complete by:  As directed    Diagnosis:   Coronary Stents PTCA STEMI     Diet - low sodium heart healthy    Complete by:  As directed    Increase activity slowly    Complete by:  As directed       Discharge Medications   Current Discharge Medication List    START taking these medications   Details  atorvastatin (LIPITOR) 80 MG tablet Take 1 tablet (80 mg total) by mouth daily at 6 PM. Qty: 30 tablet, Refills: 12    carvedilol (COREG) 3.125 MG tablet Take 1 tablet (3.125 mg total) by mouth 2 (two) times daily with a meal. Qty: 60 tablet, Refills: 12    furosemide (LASIX) 20 MG tablet Take 1 tablet (20 mg total) by mouth daily. Qty: 30 tablet, Refills: 12    lisinopril (PRINIVIL,ZESTRIL) 2.5 MG tablet Take 1 tablet (2.5 mg  total) by mouth daily. Qty: 30 tablet, Refills: 12    potassium chloride SA (K-DUR,KLOR-CON) 20 MEQ tablet Take 1 tablet (20 mEq total) by mouth daily. Qty: 30 tablet, Refills: 12    spironolactone (ALDACTONE) 25 MG tablet Take 0.5 tablets (12.5 mg total) by mouth daily. Qty: 15 tablet, Refills: 12    ticagrelor (BRILINTA) 90 MG TABS tablet Take 1 tablet (90 mg total) by mouth 2 (two) times daily. Qty: 60 tablet, Refills: 12      CONTINUE these medications which have NOT CHANGED   Details  aspirin EC 81 MG tablet Take 81 mg by mouth daily.    calcium carbonate (TUMS - DOSED IN MG ELEMENTAL CALCIUM) 500 MG chewable tablet Chew 1 tablet by mouth as needed for indigestion or heartburn.    Multiple Vitamin (MULTIVITAMIN) tablet Take 1 tablet by mouth daily.    tamsulosin (FLOMAX) 0.4 MG CAPS capsule Take 1 capsule (0.4 mg total) by mouth daily. Qty: 30 capsule, Refills: 1      STOP taking these medications     amLODipine (NORVASC) 10 MG tablet      triamterene-hydrochlorothiazide (MAXZIDE-25) 37.5-25 MG tablet      HYDROcodone-acetaminophen (NORCO) 5-325 MG tablet          Aspirin prescribed at discharge?  Yes High Intensity Statin Prescribed? (Lipitor 40-80mg  or Crestor 20-40mg ): Yes Beta Blocker Prescribed? Yes For EF 40% or less, Was ACEI/ARB Prescribed? Yes ADP Receptor Inhibitor Prescribed? (i.e. Plavix etc.-Includes Medically Managed Patients): Yes For EF <40%, Aldosterone Inhibitor Prescribed? No:  Was EF assessed during THIS hospitalization? Yes Was Cardiac Rehab II ordered? (Included Medically managed Patients): Yes   Outstanding Labs/Studies     Duration of Discharge Encounter   Greater than 30 minutes including physician time.  Signed, Arbutus Leas NP 10/14/2016, 10:46 AM  Agree with note by Jettie Booze NP  OK for DC. Awaiting rep to fit LifeVest. TOC 7. DAPT. Exam benign  Lorretta Harp, M.D., FACP, [facility], FAHA, [place] [address] [place],  [place]  [id number]  [phone] 10/14/2016 10:46 AM

## 2016-10-13 NOTE — Progress Notes (Signed)
CARDIAC REHAB PHASE I   PRE:  Rate/Rhythm: 85 SR    BP: sitting 114/55    SaO2:   MODE:  Ambulation: 1000 ft   POST:  Rate/Rhythm: 104 ST    BP: sitting 129/70     SaO2:    Tolerated well. Sts he gets tired and SOb with distance. Slowed pace slightly last 300 ft. Doubled distance today. Did teach back with him and daughter in law. They asked to watch lifevest video again. I gave them info. Pt is learning but benefits from reiteration for retention. [place], ACSM 10/13/2016 8:41 AM

## 2016-10-13 NOTE — Progress Notes (Signed)
Patient Name: Tyler Chavez Date of Encounter: 10/13/2016  Primary Cardiologist: New, Dr. [facility] Problem List     Active Problems:   STEMI involving left anterior descending coronary artery [facility])   Acute ST elevation myocardial infarction (STEMI) involving left anterior descending (LAD) coronary artery (HCC)     Subjective   Feels well, denies chest pain and SOB.   Inpatient Medications    Scheduled Meds: . aspirin  81 mg Oral Daily  . atorvastatin  80 mg Oral q1800  . carvedilol  3.125 mg Oral BID WC  . furosemide  20 mg Oral Daily  . heparin  5,000 Units Subcutaneous Q8H  . lisinopril  2.5 mg Oral Daily  . sodium chloride flush  3 mL Intravenous Q12H  . spironolactone  12.5 mg Oral Daily  . ticagrelor  90 mg Oral BID   Continuous Infusions:  PRN Meds: sodium chloride, acetaminophen, ALPRAZolam, ondansetron (ZOFRAN) IV, sodium chloride flush, zolpidem   Vital Signs    Vitals:   10/12/16 1147 10/12/16 1556 10/12/16 2000 10/12/16 2025  BP: (!) 94/49 102/63  132/80  Pulse: 79 77    Resp: (!) 22 19 (!) 23 (!) 25  Temp: 98 F (36.7 C) 97.9 F (36.6 C)  97.8 F (36.6 C)  TempSrc: Oral Oral  Oral  SpO2: 97% 95%  98%  Weight:      Height:        Intake/Output Summary (Last 24 hours) at 10/13/16 0647 Last data filed at 10/12/16 2100  Gross per 24 hour  Intake              390 ml  Output              300 ml  Net               90 ml   Filed Weights   10/07/16 1300 10/07/16 1500 10/12/16 0507  Weight: 210 lb (95.3 kg) 206 lb 2.1 oz (93.5 kg) 208 lb 5.4 oz (94.5 kg)    Physical Exam    GEN: Well nourished, well developed, in no acute distress.  HEENT: Grossly normal.  Neck: Supple, no JVD, carotid bruits, or masses. Cardiac: RRR, no murmurs, rubs, or gallops. No clubbing, cyanosis, edema.  Radials/DP/PT 2+ and equal bilaterally.  Respiratory:  Respirations regular and unlabored, clear to auscultation bilaterally. GI: Soft, nontender, nondistended,  BS + x 4. MS: no deformity or atrophy. Skin: warm and dry, no rash. Neuro:  Strength and sensation are intact. Psych: AAOx3.  Normal affect.  Labs    CBC  Recent Labs  10/11/16 0255 10/12/16 0156  WBC 6.0 5.2  HGB 13.7 12.4*  HCT 41.0 37.5*  MCV 92.3 91.7  PLT 201 [id number]   Basic Metabolic Panel  Recent Labs  10/11/16 0255 10/12/16 0156  NA 140 138  K 3.6 3.4*  CL 105 101  CO2 24 25  GLUCOSE 102* 105*  BUN 21* 17  CREATININE 1.02 0.96  CALCIUM 9.1 8.9     Telemetry    NSR- Personally Reviewed  ECG    NSR, lateral T wave inversion. - Personally Reviewed  Radiology    No results found.  Cardiac Studies  Transthoracic Echocardiography 10/12/16 Study Conclusions  - Left ventricle: The cavity size was normal. Wall thickness was   increased in a pattern of mild LVH. Systolic function was   moderately to severely reduced. The estimated ejection fraction   was in the range  of 30% to 35%. Features are consistent with a   pseudonormal left ventricular filling pattern, with concomitant   abnormal relaxation and increased filling pressure (grade 2   diastolic dysfunction).   Patient Profile     66 year old male with a past medical history of HTN, HLD, and prostate CA. Presented as an anterior STEMI on 10/07/16.   Assessment & Plan    1. Anterior STEMI: Day #6 s/p DES to proximal LAD. Returned to the cath lab on 10/11/16 for staged PCI to RCA - got DES x 2.   Echo yesterday showed EF of [id number], grade 2 diastolic dysfunction.   Continue Coreg 3.125mg  BID, lisinopril 2.5mg  daily, and spiro 12.5mg  daily.   Also on DAPT with ASA and Brilinta. Order placed for LifeVest.   2. HTN: Well controlled.   3. HLD: Continue high intensity statin. Will need LFT and FLP in 6 weeks.   Signed, Arbutus Leas, NP  10/13/2016, 6:47 AM   Agree with note by Jettie Booze NP  Feeling much better. EF still depressed by 2D yesterday (30-35%, ISCM). No CP/SOB. Exam benign. On  DAPT. OK for DC home today. Can F/U in our [place] office TOC 10 for BB titration. 2D in 3 months to eval LV fxn. Needs LifeVest fitted today and educated about it by Rep prior to DC.  Lorretta Harp, M.D., [place], [facility], [place] [address] [place], [place]  [address]  [phone] 10/13/2016 8:01 AM

## 2016-10-14 DIAGNOSIS — Z955 Presence of coronary angioplasty implant and graft: Secondary | ICD-10-CM

## 2016-10-14 MED ORDER — POTASSIUM CHLORIDE CRYS ER 20 MEQ PO TBCR
40.0000 meq | EXTENDED_RELEASE_TABLET | Freq: Once | ORAL | Status: AC
  Administered 2016-10-14: 40 meq via ORAL
  Filled 2016-10-14: qty 2

## 2016-10-14 MED ORDER — SPIRONOLACTONE 25 MG PO TABS: 12.5000 mg | ORAL_TABLET | Freq: Every day | ORAL | 12 refills | Status: DC

## 2016-10-14 MED ORDER — TICAGRELOR 90 MG PO TABS: 90.0000 mg | ORAL_TABLET | Freq: Two times a day (BID) | ORAL | 12 refills | Status: DC

## 2016-10-14 MED ORDER — POTASSIUM CHLORIDE CRYS ER 20 MEQ PO TBCR: 20.0000 meq | EXTENDED_RELEASE_TABLET | Freq: Every day | ORAL | 12 refills | Status: DC

## 2016-10-14 MED ORDER — CARVEDILOL 3.125 MG PO TABS: 3.1250 mg | ORAL_TABLET | Freq: Two times a day (BID) | ORAL | 12 refills | Status: DC

## 2016-10-14 MED ORDER — FUROSEMIDE 20 MG PO TABS: 20.0000 mg | ORAL_TABLET | Freq: Every day | ORAL | 12 refills | Status: DC

## 2016-10-14 MED ORDER — LISINOPRIL 2.5 MG PO TABS
2.5000 mg | ORAL_TABLET | Freq: Every day | ORAL | 12 refills | Status: DC
Start: 2016-10-14 — End: 2016-12-22

## 2016-10-14 MED ORDER — ATORVASTATIN CALCIUM 80 MG PO TABS: 80.0000 mg | ORAL_TABLET | Freq: Every day | ORAL | 12 refills | Status: DC

## 2016-10-14 NOTE — Progress Notes (Signed)
CARDIAC REHAB PHASE I   PRE:  Rate/Rhythm: 76 SR    BP: sitting 91/73    SaO2:   MODE:  Ambulation: 900 ft   POST:  Rate/Rhythm: 95 SR    BP: sitting 115/71     SaO2:   Tolerated well, no c/o. He has been walking on his own too. He watched education videos again yesterday with his daughter in law. He is now waiting on Lifevest. Will walk. [place], ACSM 10/14/2016 11:17 AM

## 2016-10-14 NOTE — Care Management Note (Signed)
Case Management Note Initial Note started By Tomi Bamberger RN, BSN Case Manager Patient Details  Name: Tyler Chavez MRN: [id number] Date of Birth: 08-12-1951  Subjective/Objective:    S/p stent intervention, patient has the Brilinta 30 day savings card, he states he has BCBS inurance now and in Feb will start with [employer] advantage and Medicaid.  NCM informed patient to get his 30 day free filled then, before he runs out of the 30 day to go to the pharmacy with his new insurance and have them run the card to see what his co pay will be, if it is too high ,he will need to let his cardiologist know and also his cardiologist has samples of the brilinta in the office.  Patient states he understands. [facility] [place], they do have brilinta in [place]. One bottle left.  Patient  Is indep pta.  No other needs.  NCM [facility] and they have instock as well. Patient will need zoll vest at dc, Berneta Sages with [facility] is aware ,he has all the paper work he needs.                                Action/Plan:   Expected Discharge Date:  10/14/16               Expected Discharge Plan:  Home/Self Care  In-House Referral:  NA  Discharge planning Services  CM Consult, Medication Assistance  Post Acute Care Choice:  Durable Medical Equipment Choice offered to:  Patient  DME Arranged:  Life vest DME Agency:  Other - Comment ([employer])  HH Arranged:  NA HH Agency:  NA  Status of Service:  Completed, signed off  If discussed at [place] of Stay Meetings, dates discussed:    Additional Comments: Pt was a transfer from [facility]. Plan will be for dc home 10-14-16. Awaiting Live Vest Authorization. CM did call [employer] twice in regards to authorization. Authorization pending at this time- per rep short staffed due to weather. Pt has a Ref # [id number]. Zoll Rep and CM have discussed the case. Pt can be fit from home. [employer] to call patient once approved. Pt is agreeable to be d/cwithout vest.  Staff RN to relay the information to physician in regards to information pertaining to vest. No further needs from CM at this time. [place] 10-14-16 Jacqlyn Krauss, RN, BSN [phone]  Bethena Roys, RN 10/14/2016, 2:11 PM

## 2016-10-20 NOTE — Progress Notes (Signed)
Cardiology Office Note    Date:  10/21/2016   ID:  Tyler Chavez, DOB 21-Feb-1951, MRN [id number]  PCP:  Glo Herring., MD  Cardiologist: Dr. Claiborne Billings  Chief Complaint  Patient presents with  . Follow-up    STEMI  pt c/o occasion SOB on exertion; no other Sx.    History of Present Illness:    Tyler Chavez is a 66 y.o. male with past medical history of HTN, HLD, and prostate cancer who presents to the office today for hospital follow-up.   He was recently admitted from 1/11 - 10/14/2016 for an anterior STEMI. Initial catheterization showed 100% Ost LAD stenosis, 95% Post Atrio stenosis, 90% Prox RCA stenosis, and 75% Ost to Prox Cx stenosis. PCI was performed of the LAD with placement of a 4.0x64mm Resolute Onyx DES. He returned back to the cath lab on 1/15 for staged intervention with DES placement to the Post Atrio and DES to the RCA. Initial echocardiogram showed an EF of 25-30% with repeat limited echo on 1/16 showing an EF of 30-35%. He was placed on DAPT with ASA and Brilinta along with Lipitor 80mg  daily, Lasix 20mg  daily, Coreg 3.125mg  BID, Lisinopril 2.5mg  daily, and Spironolactone 12.5mg  daily.   In talking with the patient today, he reports doing well since his recent hospitalization. He is wearing his LifeVest today. He denies any repeat episodes of chest pain. Has been ambulating up to 1-2 blocks per day and reports mild dyspnea with this but no chest discomfort. He reports good compliance with his medications. Has not missed any doses of ASA or Brilinta. He has yet to have his co-pay checked for the [place].   He is anxious to start cardiac rehab in [place], as he is active at baseline and wants to be back to his normal function.    Past Medical History:  Diagnosis Date  . Coronary artery disease    a. 09/2016: Anterior STEMI with 100% Ost LAD stenosis, 95% Post Atrio stenosis, 90% Prox RCA stenosis, and 75% Ost to Prox Cx stenosis. DES to LAD. Staged DES to Post Atrio  and DES to RCA performed later in admission.  . ED (erectile dysfunction)   . Frequency of urination   . GERD (gastroesophageal reflux disease)   . Hyperlipidemia   . Hypertension   . Ischemic cardiomyopathy    a. 09/2016: echo showing EF of 30-35% with Grade 2 DD  . Myocardial infarction   . Pneumonia   . Prostate cancer [facility]) urologist- dr wrenn/  oncologist-  dr Tammi Klippel   Stage T1c, Gleason 3+3, PSA 8.2, vol 49.9grams  . Rash    lower right leg  . Wears glasses     Past Surgical History:  Procedure Laterality Date  . CARDIAC CATHETERIZATION N/A 10/07/2016   Procedure: Left Heart Cath and Coronary Angiography;  Surgeon: Troy Sine, MD;  Location: [place] [facility];  Service: Cardiovascular;  Laterality: N/A;  . CARDIAC CATHETERIZATION N/A 10/07/2016   Procedure: Coronary Stent Intervention;  Surgeon: Troy Sine, MD;  Location: [place] [facility];  Service: Cardiovascular;  Laterality: N/A;  . CARDIAC CATHETERIZATION N/A 10/11/2016   Procedure: Coronary Stent Intervention;  Surgeon: Peter M Martinique, MD;  Location: [place] [facility];  Service: Cardiovascular;  Laterality: N/A;  . CYSTOSCOPY  02/19/2016   Procedure: CYSTOSCOPY;  Surgeon: Irine Seal, MD;  Location[facility];  Service: Urology;;  no seeds visualized in bladder  . NO PAST SURGERIES    .  RADIOACTIVE SEED IMPLANT N/A 02/19/2016   Procedure: RADIOACTIVE SEED IMPLANT/BRACHYTHERAPY IMPLANT;  Surgeon: Irine Seal, MD;  Location: [facility];  Service: Urology;  Laterality: N/A;  79   seeds implanted    Current Medications: Outpatient Medications Prior to Visit  Medication Sig Dispense Refill  . aspirin EC 81 MG tablet Take 81 mg by mouth daily.    [name] atorvastatin (LIPITOR) 80 MG tablet Take 1 tablet (80 mg total) by mouth daily at 6 PM. 30 tablet 12  . calcium carbonate (TUMS - DOSED IN MG ELEMENTAL CALCIUM) 500 MG chewable tablet Chew 1 tablet by mouth as needed for indigestion or  heartburn.    . carvedilol (COREG) 3.125 MG tablet Take 1 tablet (3.125 mg total) by mouth 2 (two) times daily with a meal. 60 tablet 12  . furosemide (LASIX) 20 MG tablet Take 1 tablet (20 mg total) by mouth daily. 30 tablet 12  . lisinopril (PRINIVIL,ZESTRIL) 2.5 MG tablet Take 1 tablet (2.5 mg total) by mouth daily. 30 tablet 12  . Multiple Vitamin (MULTIVITAMIN) tablet Take 1 tablet by mouth daily.    . potassium chloride SA (K-DUR,KLOR-CON) 20 MEQ tablet Take 1 tablet (20 mEq total) by mouth daily. 30 tablet 12  . spironolactone (ALDACTONE) 25 MG tablet Take 0.5 tablets (12.5 mg total) by mouth daily. 15 tablet 12  . ticagrelor (BRILINTA) 90 MG TABS tablet Take 1 tablet (90 mg total) by mouth 2 (two) times daily. 60 tablet 12  . tamsulosin (FLOMAX) 0.4 MG CAPS capsule Take 1 capsule (0.4 mg total) by mouth daily. (Patient not taking: Reported on 10/21/2016) 30 capsule 1   No facility-administered medications prior to visit.      Allergies:   Patient has no known allergies.   Social History   Social History  . Marital status: Widowed    Spouse name: N/A  . Number of children: N/A  . Years of education: N/A   Social History Main Topics  . Smoking status: Former Smoker    Packs/day: 1.50    Years: 42.00    Types: Cigarettes    Quit date: 02/11/2014  . Smokeless tobacco: Never Used  . Alcohol use Yes     Comment: occasional  . Drug use: No  . Sexual activity: Not Asked   Other Topics Concern  . None   Social History Narrative  . None     Family History:  The patient's family history includes Heart attack in his mother.   Review of Systems:   Please see the history of present illness.     General:  No chills, fever, night sweats or weight changes.  Cardiovascular:  No chest pain, edema, orthopnea, palpitations, paroxysmal nocturnal dyspnea. Positive for dyspnea with exertion.  Dermatological: No rash, lesions/masses Respiratory: No cough, dyspnea Urologic: No  hematuria, dysuria Abdominal:   No nausea, vomiting, diarrhea, bright red blood per rectum, melena, or hematemesis Neurologic:  No visual changes, wkns, changes in mental status. All other systems reviewed and are otherwise negative except as noted above.   Physical Exam:    VS:  BP (!) 112/58 (BP Location: Left Arm, Patient Position: Sitting, Cuff Size: Large)   Pulse 69   Ht 6\' 2"  (1.88 m)   Wt 204 lb (92.5 kg)   BMI 26.19 kg/m    General: Well developed, well nourished [place] American male appearing in no acute distress. Head: Normocephalic, atraumatic, sclera non-icteric, no xanthomas, nares are without discharge.  Neck: No carotid  bruits. JVD not elevated.  Lungs: Respirations regular and unlabored, without wheezes or rales.  Heart: Regular rate and rhythm. No S3 or S4.  No murmur, no rubs, or gallops appreciated. LifeVest in place.  Abdomen: Soft, non-tender, non-distended with normoactive bowel sounds. No hepatomegaly. No rebound/guarding. No obvious abdominal masses. Msk:  Strength and tone appear normal for age. No joint deformities or effusions. Extremities: No clubbing or cyanosis. No edema.  Distal pedal pulses are 2+ bilaterally. Neuro: Alert and oriented X 3. Moves all extremities spontaneously. No focal deficits noted. Psych:  Responds to questions appropriately with a normal affect. Skin: No rashes or lesions noted. Right radial cath site without ecchymosis or evidence of a hematoma.   Wt Readings from Last 3 Encounters:  10/21/16 204 lb (92.5 kg)  10/14/16 200 lb 13.4 oz (91.1 kg)  03/18/16 209 lb 3.2 oz (94.9 kg)     Studies/Labs Reviewed:   EKG:  EKG is ordered today.  The ekg ordered today demonstrates NSR, HR 69, with PAC's, incomplete RBBB, and lateral TWI.   Recent Labs: 02/12/2016: ALT 17 10/12/2016: BUN 17; Creatinine, Ser 0.96; Hemoglobin 12.4; Platelets 215; Potassium 3.4; Sodium 138   Lipid Panel No results found for: CHOL, TRIG, HDL, CHOLHDL, VLDL,  LDLCALC, LDLDIRECT  Additional studies/ records that were reviewed today include:   Cardiac Catheterization: 07/28/2017  Prox RCA lesion, 90 %stenosed.  Ost Cx to Prox Cx lesion, 75 %stenosed.  Post Atrio lesion, 95 %stenosed.  A STENT RESOLUTE ONYX 4.0X15 drug eluting stent was successfully placed.  Ost LAD lesion, 100 %stenosed.  Post intervention, there is a 0% residual stenosis.   Acute anterior wall ST segment elevation myocardial infarction secondary to total ostial occlusion of the LAD.  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  Successful  PCI to the totally occluded LAD at the ostium with ultimate insertion of a 4.015 mm Resolute Onyx DES stent postdilated to 4.10 mm with the 100% occlusion reduced to 0% and TIMI 0 flow being improved TIMI-3 flow.  Hypotension/cardiogenic shock with acute ischemic cardiomyopathy requiring dopamine/fluid resuscitation.  LVEDP 22.  RECOMMENDATION: The patient will need to continue DAPT for minimal pf a year and probably indefinitely.  He was transported to the CCU pain-free on dopamine at 5 mcg/kg/min.  He will require staged PCI to his RCA and circumflex lesions prior to hospital discharge if stable.  Will need aggressive medical therapy including ACE-I/ARB, probable aldosterone blockade with high likelihood of post-MI CHF, high potency statin therapy, and initiation of carvedilol once off pressor therapy.  Plan echo Doppler study.  Coronary Stent Intervention: 09/2016  Ost Cx to Prox Cx lesion, 40 %stenosed.  Post Atrio lesion, 95 %stenosed.  A STENT RESOLUTE ONYX [id number] drug eluting stent was successfully placed.  Post intervention, there is a 0% residual stenosis.  Prox RCA lesion, 90 %stenosed.  A STENT RESOLUTE ONYX 3.5X12 drug eluting stent was successfully placed.  Post intervention, there is a 0% residual  stenosis.   1. Successful stenting of the PLOM with a DES 2. Successful stenting of the proximal RCA with a DES 3. Nonobstructive disease in the LCx 4. Patent LAD stent  Plan: DAPT for at least one year.   Echocardiogram: 10/12/2016 Study Conclusions  - Left ventricle: The cavity size was normal. Wall thickness was   increased in a pattern of mild LVH. Systolic function was  moderately to severely reduced. The estimated ejection fraction   was in the range of 30% to 35%. Features are consistent with a   pseudonormal left ventricular filling pattern, with concomitant   abnormal relaxation and increased filling pressure (grade 2   diastolic dysfunction).  Assessment:    1. ST elevation myocardial infarction (STEMI) of anterior wall, subsequent episode of care ([place])   2. Cardiomyopathy, ischemic   3. Medication management   4. Essential hypertension   5. Hyperlipidemia, unspecified hyperlipidemia type      Plan:   In order of problems listed above:  1. Subsequent Episode of Care for Anterior STEMI - presented to [facility] on 10[facility]07[facility]2016 as an anterior STEMI. Cath showed 100% Ost LAD stenosis, 95% Post Atrio stenosis, 90% Prox RCA stenosis, and 75% Ost to Prox Cx stenosis. PCI was performed of the LAD with placement of a 4.0x29mm Resolute Onyx DES. Underwent staged PCI on 1/15 with DES placement to the Post Atrio and DES to the RCA. - EKG today shows known lateral TWI with no acute changes. No repeat episodes of chest pain. - continue DAPT with ASA and Brilinta (patient will check co-pay of Brilinta - if not affordable, will need to switch to Plavix or enroll in patient assistance). - continue BB, statin, and ACE-I.   2. Ischemic Cardiomyopathy - initial echocardiogram following recent STEMI showed an EF of 25-30% with repeat limited echo on 1/16 showing an EF of 30-35%.  - has LifeVest on today. Will obtain a repeat echocardiogram in 3 months to assess LV function. If EF  remains decreased, will need to consider ICD placement.  - continue Lasix 20mg  daily, Coreg 3.125mg  BID, Lisinopril 2.5mg  daily, and Spironolactone 12.5mg  daily. Will not titrate at this time with transient episodes of hypotension.   3. HTN - BP well-controlled at 112/58 during today's visit. - continue Coreg 3.125mg  BID, Lasix 20mg  daily, Lisinopril 2.5mg  daily, and Spironolactone 12.5mg  daily.   4. HLD - started on high-intensity statin during recent admission. - continue Atorvastatin 80mg  daily with goal LDL < 70. Not fasting today. Will need repeat Lipid Panel and LFT's in 8 weeks.     Medication Adjustments/Labs and Tests Ordered: Current medicines are reviewed at length with the patient today.  Concerns regarding medicines are outlined above.  Medication changes, Labs and Tests ordered today are listed in the Patient Instructions below. Patient Instructions  Medication Instructions:  Your physician recommends that you continue on your current medications as directed. Please refer to the Current Medication list given to you today.  If you need a refill on your cardiac medications before your next appointment, please call your pharmacy.  Labwork: BMET TODAY AT [facility] ON THE 1ST FLOOR  Testing/Procedures: Your physician has requested that you have an echocardiogram-IN 2.5-3 MONTHS. Echocardiography is a painless test that uses sound waves to create images of your heart. It provides your doctor with information about the size and shape of your heart and how well your heart's chambers and valves are working. This procedure takes approximately one hour. There are no restrictions for this procedure.   Follow-Up: Your physician recommends that you schedule a follow-up appointment in: AFTER ECHO WITH DR Mellissa Kohut, Erma Heritage, PA  10/21/2016 5:03 PM    [facility] [place], [place], [place]  [address] Phone: [phone]; Fax:  [phone]  [address], [place], [place] [address] Phone: [phone]

## 2016-10-21 ENCOUNTER — Encounter: Payer: Self-pay | Admitting: Student

## 2016-10-21 ENCOUNTER — Ambulatory Visit (INDEPENDENT_AMBULATORY_CARE_PROVIDER_SITE_OTHER): Payer: BLUE CROSS/BLUE SHIELD | Admitting: Student

## 2016-10-21 VITALS — BP 112/58 | HR 69 | Ht 74.0 in | Wt 204.0 lb

## 2016-10-21 DIAGNOSIS — I255 Ischemic cardiomyopathy: Secondary | ICD-10-CM | POA: Diagnosis not present

## 2016-10-21 DIAGNOSIS — Z79899 Other long term (current) drug therapy: Secondary | ICD-10-CM | POA: Diagnosis not present

## 2016-10-21 DIAGNOSIS — E785 Hyperlipidemia, unspecified: Secondary | ICD-10-CM

## 2016-10-21 DIAGNOSIS — I2109 ST elevation (STEMI) myocardial infarction involving other coronary artery of anterior wall: Secondary | ICD-10-CM | POA: Insufficient documentation

## 2016-10-21 DIAGNOSIS — I10 Essential (primary) hypertension: Secondary | ICD-10-CM

## 2016-10-21 LAB — BASIC METABOLIC PANEL
BUN: 21 mg/dL (ref 7–25)
CHLORIDE: 99 mmol/L (ref 98–110)
CO2: 28 mmol/L (ref 20–31)
Calcium: 9.8 mg/dL (ref 8.6–10.3)
Creat: 0.93 mg/dL (ref 0.70–1.25)
Glucose, Bld: 111 mg/dL — ABNORMAL HIGH (ref 65–99)
POTASSIUM: 4.6 mmol/L (ref 3.5–5.3)
SODIUM: 139 mmol/L (ref 135–146)

## 2016-10-21 NOTE — Patient Instructions (Signed)
Medication Instructions:  Your physician recommends that you continue on your current medications as directed. Please refer to the Current Medication list given to you today.  If you need a refill on your cardiac medications before your next appointment, please call your pharmacy.  Labwork: BMET TODAY AT [facility] ON THE 1ST FLOOR  Testing/Procedures: Your physician has requested that you have an echocardiogram-IN 2-3 MONTHS. Echocardiography is a painless test that uses sound waves to create images of your heart. It provides your doctor with information about the size and shape of your heart and how well your heart's chambers and valves are working. This procedure takes approximately one hour. There are no restrictions for this procedure.   Follow-Up: Your physician recommends that you schedule a follow-up appointment in: AFTER ECHO WITH DR Claiborne Billings    Thank you for choosing [facility] at [facility], LPN Bernerd Pho, PA-C

## 2016-12-16 ENCOUNTER — Other Ambulatory Visit: Payer: Self-pay

## 2016-12-16 ENCOUNTER — Ambulatory Visit (HOSPITAL_COMMUNITY): Payer: PPO | Attending: Cardiovascular Disease

## 2016-12-16 DIAGNOSIS — I252 Old myocardial infarction: Secondary | ICD-10-CM | POA: Diagnosis not present

## 2016-12-16 DIAGNOSIS — I2510 Atherosclerotic heart disease of native coronary artery without angina pectoris: Secondary | ICD-10-CM | POA: Diagnosis not present

## 2016-12-16 DIAGNOSIS — E785 Hyperlipidemia, unspecified: Secondary | ICD-10-CM | POA: Diagnosis not present

## 2016-12-16 DIAGNOSIS — I071 Rheumatic tricuspid insufficiency: Secondary | ICD-10-CM | POA: Insufficient documentation

## 2016-12-16 DIAGNOSIS — I255 Ischemic cardiomyopathy: Secondary | ICD-10-CM | POA: Insufficient documentation

## 2016-12-16 DIAGNOSIS — Z87891 Personal history of nicotine dependence: Secondary | ICD-10-CM | POA: Insufficient documentation

## 2016-12-16 DIAGNOSIS — I10 Essential (primary) hypertension: Secondary | ICD-10-CM | POA: Insufficient documentation

## 2016-12-16 DIAGNOSIS — J189 Pneumonia, unspecified organism: Secondary | ICD-10-CM | POA: Insufficient documentation

## 2016-12-22 ENCOUNTER — Encounter: Payer: Self-pay | Admitting: Cardiovascular Disease

## 2016-12-22 ENCOUNTER — Ambulatory Visit (INDEPENDENT_AMBULATORY_CARE_PROVIDER_SITE_OTHER): Payer: PPO | Admitting: Cardiovascular Disease

## 2016-12-22 DIAGNOSIS — Z955 Presence of coronary angioplasty implant and graft: Secondary | ICD-10-CM | POA: Diagnosis not present

## 2016-12-22 DIAGNOSIS — I255 Ischemic cardiomyopathy: Secondary | ICD-10-CM

## 2016-12-22 DIAGNOSIS — Z79899 Other long term (current) drug therapy: Secondary | ICD-10-CM | POA: Diagnosis not present

## 2016-12-22 DIAGNOSIS — I2109 ST elevation (STEMI) myocardial infarction involving other coronary artery of anterior wall: Secondary | ICD-10-CM

## 2016-12-22 DIAGNOSIS — E785 Hyperlipidemia, unspecified: Secondary | ICD-10-CM | POA: Diagnosis not present

## 2016-12-22 MED ORDER — CARVEDILOL 3.125 MG PO TABS: ORAL_TABLET | ORAL | 6 refills | Status: DC

## 2016-12-22 MED ORDER — SACUBITRIL-VALSARTAN 24-26 MG PO TABS: 1.0000 | ORAL_TABLET | Freq: Two times a day (BID) | ORAL | 6 refills | Status: DC

## 2016-12-22 NOTE — Patient Instructions (Signed)
Your physician recommends that you return for lab work in: 1-2 weeks.  Your physician has recommended you make the following change in your medication:   1.) STOP your lisinopril today.  2.) starting on Friday March 30th begin taking Entresto 24/26 samples provided 1 TABLET twice a day.  3.) STOP your potassium.  4.) IF YOUR BLOOD PRESSURE GETS LOW OR IF YOU GET LIGHTHEADED HOLD THE FUROSEMIDE.  5.) the carvedilol has been increased to 1 & 1/2 tablet twice a day.  Your physician recommends that you return for lab work in: 3-4 weeks with Dr Claiborne Billings.

## 2016-12-23 NOTE — Progress Notes (Signed)
Cardiology Office Note    Date:  12/24/2016   ID:  Tyler Chavez, DOB 1951-08-02, MRN [id number]  PCP:  Glo Herring, MD  Cardiologist:  Shelva Majestic, MD   Chief Complaint  Patient presents with  . Shortness of Breath    pt states some   Initial ofice evaluation with me  History of Present Illness:  Tyler Chavez is a 66 y.o. male 7 anterior wall myocardial infarction in January 2018.  He presents for initial office follow-up evaluation with me following his hospitalization.  Tyler Chavez has a history of hypertension valve.  New-onset chest pain Henry 10 2018 at approximately 11 PM.  Chest pain persisted throughout the night and ultimately on the morning of January 11 [place] to [facility].  His ECG showed findings compatible with an acute anterior STEMI.  He was transferred urgently to [facility] hospital right performed emergent cardiac catheterization.  Authorization revealed total occlusion of the LAD at the ostium, 75% proximal circumflex stenosis, 95% proximal RCA stenosis, and 95% PLA stenoses.  He underwent successful PCI to his totally occluded LAD with ultimate insertion of a 4.015 mm Resolute onyx DES stent postdilated to 4.1 mm with the percent occlusion being reduced to 0% and restoration of TIMI-3 flow.  He initially had hypotension and cardiogenic shock.  It was acute ischemic cardiopathy and required dopamine and fluid resuscitation.  7 days later.  He was brought back to the catheterization laboratory and underwent staged PCI to his PLA vessel and proximal RCA.  His initial echo showed an EF 25-30%.  Several days later showed his EF increased to 35%.  On gender 81 2018.  He was seen in the office by Brittney straighter.  Currently, he has been pain-free.  He has been on atorvastatin 80 mg for hyperlipidemia, carvedilol 3.125 mg twice a day, furosemide 20 mg, lisinopril 2.5 mg, and spironolactone 12.5 mg daily.  He is on aspirin and Brilinta for dual antiplatelet therapy.  He  presents for reevaluation.  9.  Recurrent chest pain.  He does note rare shortness of breath.  He is unaware of palpitations.    Past Medical History:  Diagnosis Date  . Coronary artery disease    a. 09/2016: Anterior STEMI with 100% Ost LAD stenosis, 95% Post Atrio stenosis, 90% Prox RCA stenosis, and 75% Ost to Prox Cx stenosis. DES to LAD. Staged DES to Post Atrio and DES to RCA performed later in admission.  . ED (erectile dysfunction)   . Frequency of urination   . GERD (gastroesophageal reflux disease)   . Hyperlipidemia   . Hypertension   . Ischemic cardiomyopathy    a. 09/2016: echo showing EF of 30-35% with Grade 2 DD  . Myocardial infarction   . Pneumonia   . Prostate cancer [facility]) urologist- dr wrenn/  oncologist-  dr Tammi Klippel   Stage T1c, Gleason 3+3, PSA 8.2, vol 49.9grams  . Rash    lower right leg  . Wears glasses     Past Surgical History:  Procedure Laterality Date  . CARDIAC CATHETERIZATION N/A 10/07/2016   Procedure: Left Heart Cath and Coronary Angiography;  Surgeon: Troy Sine, MD;  Location: [place] [facility];  Service: Cardiovascular;  Laterality: N/A;  . CARDIAC CATHETERIZATION N/A 10/07/2016   Procedure: Coronary Stent Intervention;  Surgeon: Troy Sine, MD;  Location: [place] [facility];  Service: Cardiovascular;  Laterality: N/A;  . CARDIAC CATHETERIZATION N/A 10/11/2016   Procedure: Coronary Stent Intervention;  Surgeon: Collier Salina  M Martinique, MD;  Location: [place] [facility];  Service: Cardiovascular;  Laterality: N/A;  . CYSTOSCOPY  02/19/2016   Procedure: CYSTOSCOPY;  Surgeon: Irine Seal, MD;  Location: [facility];  Service: Urology;;  no seeds visualized in bladder  . NO PAST SURGERIES    . RADIOACTIVE SEED IMPLANT N/A 02/19/2016   Procedure: RADIOACTIVE SEED IMPLANT/BRACHYTHERAPY IMPLANT;  Surgeon: Irine Seal, MD;  Location: [facility];  Service: Urology;  Laterality: N/A;  79   seeds implanted    Current  Medications: Outpatient Medications Prior to Visit  Medication Sig Dispense Refill  . aspirin EC 81 MG tablet Take 81 mg by mouth daily.    [name] atorvastatin (LIPITOR) 80 MG tablet Take 1 tablet (80 mg total) by mouth daily at 6 PM. 30 tablet 12  . calcium carbonate (TUMS - DOSED IN MG ELEMENTAL CALCIUM) 500 MG chewable tablet Chew 1 tablet by mouth as needed for indigestion or heartburn.    . furosemide (LASIX) 20 MG tablet Take 1 tablet (20 mg total) by mouth daily. 30 tablet 12  . Multiple Vitamin (MULTIVITAMIN) tablet Take 1 tablet by mouth daily.    [name] spironolactone (ALDACTONE) 25 MG tablet Take 0.5 tablets (12.5 mg total) by mouth daily. 15 tablet 12  . tamsulosin (FLOMAX) 0.4 MG CAPS capsule Take 1 capsule (0.4 mg total) by mouth daily. 30 capsule 1  . ticagrelor (BRILINTA) 90 MG TABS tablet Take 1 tablet (90 mg total) by mouth 2 (two) times daily. 60 tablet 12  . carvedilol (COREG) 3.125 MG tablet Take 1 tablet (3.125 mg total) by mouth 2 (two) times daily with a meal. 60 tablet 12  . lisinopril (PRINIVIL,ZESTRIL) 2.5 MG tablet Take 1 tablet (2.5 mg total) by mouth daily. 30 tablet 12  . potassium chloride SA (K-DUR,KLOR-CON) 20 MEQ tablet Take 1 tablet (20 mEq total) by mouth daily. 30 tablet 12   No facility-administered medications prior to visit.      Allergies:   Patient has no known allergies.   Social History   Social History  . Marital status: Widowed    Spouse name: N/A  . Number of children: N/A  . Years of education: N/A   Social History Main Topics  . Smoking status: Former Smoker    Packs/day: 1.50    Years: 42.00    Types: Cigarettes    Quit date: 02/11/2014  . Smokeless tobacco: Never Used  . Alcohol use Yes     Comment: occasional  . Drug use: No  . Sexual activity: Not Asked   Other Topics Concern  . None   Social History Narrative  . None     Family History:  The patient's family history includes Heart attack in his mother.   ROS General:  Negative; No fevers, chills, or night sweats;  HEENT: Negative; No changes in vision or hearing, sinus congestion, difficulty swallowing Pulmonary: Negative; No cough, wheezing, shortness of breath, hemoptysis Cardiovascular:  See HPI GI: Negative; No nausea, vomiting, diarrhea, or abdominal pain GU: Negative; No dysuria, hematuria, or difficulty voiding Musculoskeletal: Negative; no myalgias, joint pain, or weakness Hematologic/Oncology: Negative; no easy bruising, bleeding Endocrine: Negative; no heat/cold intolerance; no diabetes Neuro: Negative; no changes in balance, headaches Skin: Negative; No rashes or skin lesions Psychiatric: Negative; No behavioral problems, depression Sleep: Negative; No snoring, daytime sleepiness, hypersomnolence, bruxism, restless legs, hypnogognic hallucinations, no cataplexy Other comprehensive 14 point system review is negative.   PHYSICAL EXAM:   VS:  BP 131/84   Pulse 66   Ht 6' 2"  (1.88 m)   Wt 196 lb 3.2 oz (89 kg)   BMI 25.19 kg/m    Wt Readings from Last 3 Encounters:  12/22/16 196 lb 3.2 oz (89 kg)  10/21/16 204 lb (92.5 kg)  10/14/16 200 lb 13.4 oz (91.1 kg)    General: Alert, oriented, no distress.  Skin: normal turgor, no rashes, warm and dry HEENT: Normocephalic, atraumatic. Pupils equal round and reactive to light; sclera anicteric; extraocular muscles intact; Fundi without hemorrhages or exudates Nose without nasal septal hypertrophy Mouth/Parynx benign; Mallinpatti scale 3 Neck: No JVD, no carotid bruits; normal carotid upstroke Lungs: clear to ausculatation and percussion; no wheezing or rales Chest wall: without tenderness to palpitation Heart: PMI not displaced, RRR, s1 s2 normal, 1/6 systolic murmur, no diastolic murmur, no rubs, gallops, thrills, or heaves Abdomen: soft, nontender; no hepatosplenomehaly, BS+; abdominal aorta nontender and not dilated by palpation. Back: no CVA tenderness Pulses 2+ Musculoskeletal: full  range of motion, normal strength, no joint deformities Extremities: no clubbing cyanosis or edema, Homan's sign negative  Neurologic: grossly nonfocal; Cranial nerves grossly wnl Psychologic: Normal mood and affect   Studies/Labs Reviewed:   EKG:  EKG is ordered today.  ECG (independently read by me): Normal sinus rhythm at 73 bpm.  Incomplete right bundle branch block.  Left anterior hemiblock, septal infarct.  Recent Labs: BMP Latest Ref Rng & Units 10/21/2016 10/12/2016 10/11/2016  Glucose 65 - 99 mg/dL 111(H) 105(H) 102(H)  BUN 7 - 25 mg/dL 21 17 21(H)  Creatinine 0.70 - 1.25 mg/dL 0.93 0.96 1.02  Sodium 135 - 146 mmol/L 139 138 140  Potassium 3.5 - 5.3 mmol/L 4.6 3.4(L) 3.6  Chloride 98 - 110 mmol/L 99 101 105  CO2 20 - 31 mmol/L 28 25 24   Calcium 8.6 - 10.3 mg/dL 9.8 8.9 9.1     Hepatic Function Latest Ref Rng & Units 02/12/2016  Total Protein 6.5 - 8.1 g/dL 7.4  Albumin 3.5 - 5.0 g/dL 4.5  AST 15 - 41 U/L 19  ALT 17 - 63 U/L 17  Alk Phosphatase 38 - 126 U/L 68  Total Bilirubin 0.3 - 1.2 mg/dL 0.7    CBC Latest Ref Rng & Units 10/12/2016 10/11/2016 10/10/2016  WBC 4.0 - 10.5 K/uL 5.2 6.0 7.4  Hemoglobin 13.0 - 17.0 g/dL 12.4(L) 13.7 13.8  Hematocrit 39.0 - 52.0 % 37.5(L) 41.0 41.5  Platelets 150 - 400 K/uL 215 201 195   Lab Results  Component Value Date   MCV 91.7 10/12/2016   MCV 92.3 10/11/2016   MCV 92.2 10/10/2016   No results found for: TSH Lab Results  Component Value Date   HGBA1C 4.9 10/08/2016     BNP No results found for: BNP  ProBNP No results found for: PROBNP   Lipid Panel  No results found for: CHOL, TRIG, HDL, CHOLHDL, VLDL, LDLCALC, LDLDIRECT   RADIOLOGY: No results found.   Additional studies/ records that were reviewed today include:  I reviewed his hospitalization, cardia catheterization and intervention reports, subsequent office visit,  Laboratory and echo Doppler study    ASSESSMENT:    1. ST elevation myocardial infarction  (STEMI) of anterior wall, subsequent episode of care ([place])   2. Status post coronary artery stent placement   3. Cardiomyopathy, ischemic   4. Hyperlipidemia LDL goal <70   5. Medication management      PLAN:  Tyler Chavez is a 66 year old African-American male  who suffered an anterior wall ST segment elevation MI secondary to total occlusion of the LAD at the ostium on 10/07/2016.  He underwent successful PCI with stenting and several days later underwent staged PCI to his proximal RCA and his distal posterolateral branch.  He had initial proximal circumflex disease but on repeat evaluation.  This was improved and is on medical therapy.  He has taken his lisinopril dose this morning.  He denies recurrent angina, but admits to some mild shortness of breath and I suspect has class II Heart Association CHF symptoms. He is an excellent andidate for initiation of entresto.  As result, he will discontinue lisinopril.  In 2days, he will start entresto 24/26 bid.  He will slightly increase carvedilol to 1-1/2 pills twice a day and ultimately further dose titration will be necessary.  I recommended he discontinue supplemental potassium.  If his blood pressure .  He had slow his furosemide dose will be reduced and possibly discontinued.  As long as there is no overt CHF.  Repeat blood work will be obtained.  I will see him in 4 weeks for follow-up evaluation and urther titration of entresto to 49/51 if tolerated. A repeat echo Doppler study obtained in several months following  further maximization of medication to see if LV function improves.  If his LV function remains depressed on good medical therapy, he will then need referral  for prophylactic ICD implantation.  We'll see him in 4 weeks for reevaluation.   Medication Adjustments/Labs and Tests Ordered: Current medicines are reviewed at length with the patient today.  Concerns regarding medicines are outlined above.  Medication changes, Labs and Tests  ordered today are listed in the Patient Instructions below. Patient Instructions  Your physician recommends that you return for lab work in: 1-2 weeks.  Your physician has recommended you make the following change in your medication:   1.) STOP your lisinopril today.  2.) starting on Friday March 30th begin taking Entresto 24/26 samples provided 1 TABLET twice a day.  3.) STOP your potassium.  4.) IF YOUR BLOOD PRESSURE GETS LOW OR IF YOU GET LIGHTHEADED HOLD THE FUROSEMIDE.  5.) the carvedilol has been increased to 1 & 1/2 tablet twice a day.  Your physician recommends that you return for lab work in: 3-4 weeks with Dr Claiborne Billings.        Signed, Shelva Majestic, MD  12/24/2016 5:32 PM    [facility] [address] [place], [place], [place]  [address] Phone: [phone]

## 2016-12-27 DIAGNOSIS — N401 Enlarged prostate with lower urinary tract symptoms: Secondary | ICD-10-CM | POA: Diagnosis not present

## 2016-12-27 DIAGNOSIS — R3121 Asymptomatic microscopic hematuria: Secondary | ICD-10-CM | POA: Diagnosis not present

## 2016-12-27 DIAGNOSIS — R3911 Hesitancy of micturition: Secondary | ICD-10-CM | POA: Diagnosis not present

## 2016-12-27 DIAGNOSIS — Z8546 Personal history of malignant neoplasm of prostate: Secondary | ICD-10-CM | POA: Diagnosis not present

## 2017-01-12 DIAGNOSIS — Z79899 Other long term (current) drug therapy: Secondary | ICD-10-CM | POA: Diagnosis not present

## 2017-01-12 DIAGNOSIS — Z955 Presence of coronary angioplasty implant and graft: Secondary | ICD-10-CM | POA: Diagnosis not present

## 2017-01-12 LAB — LIPID PANEL
CHOL/HDL RATIO: 2.6 ratio (ref <5.0)
Cholesterol: 108 mg/dL (ref <200)
HDL: 41 mg/dL (ref >40)
LDL CALC: 53 mg/dL (ref <100)
Triglycerides: 69 mg/dL (ref <150)
VLDL: 14 mg/dL (ref <30)

## 2017-01-12 LAB — CBC
HEMATOCRIT: 41.4 % (ref 38.5–50.0)
Hemoglobin: 13.4 g/dL (ref 13.2–17.1)
MCH: 31.1 pg (ref 27.0–33.0)
MCHC: 32.4 g/dL (ref 32.0–36.0)
MCV: 96.1 fL (ref 80.0–100.0)
MPV: 11.5 fL (ref 7.5–12.5)
Platelets: 229 10*3/uL (ref 140–400)
RBC: 4.31 MIL/uL (ref 4.20–5.80)
RDW: 13.7 % (ref 11.0–15.0)
WBC: 4.2 10*3/uL (ref 3.8–10.8)

## 2017-01-12 LAB — COMPREHENSIVE METABOLIC PANEL
ALT: 14 U/L (ref 9–46)
AST: 16 U/L (ref 10–35)
Albumin: 3.8 g/dL (ref 3.6–5.1)
Alkaline Phosphatase: 73 U/L (ref 40–115)
BUN: 13 mg/dL (ref 7–25)
CALCIUM: 8.9 mg/dL (ref 8.6–10.3)
CO2: 31 mmol/L (ref 20–31)
Chloride: 105 mmol/L (ref 98–110)
Creat: 0.82 mg/dL (ref 0.70–1.25)
GLUCOSE: 111 mg/dL — AB (ref 65–99)
POTASSIUM: 4.1 mmol/L (ref 3.5–5.3)
Sodium: 141 mmol/L (ref 135–146)
Total Bilirubin: 0.7 mg/dL (ref 0.2–1.2)
Total Protein: 6.1 g/dL (ref 6.1–8.1)

## 2017-01-12 LAB — TSH: TSH: 2.02 m[IU]/L (ref 0.40–4.50)

## 2017-01-17 ENCOUNTER — Encounter: Payer: Self-pay | Admitting: *Deleted

## 2017-01-18 ENCOUNTER — Ambulatory Visit (INDEPENDENT_AMBULATORY_CARE_PROVIDER_SITE_OTHER): Payer: PPO | Admitting: Cardiovascular Disease

## 2017-01-18 ENCOUNTER — Encounter: Payer: Self-pay | Admitting: Cardiovascular Disease

## 2017-01-18 VITALS — BP 124/78 | HR 68 | Ht 74.0 in | Wt 198.0 lb

## 2017-01-18 DIAGNOSIS — I255 Ischemic cardiomyopathy: Secondary | ICD-10-CM

## 2017-01-18 DIAGNOSIS — Z955 Presence of coronary angioplasty implant and graft: Secondary | ICD-10-CM

## 2017-01-18 DIAGNOSIS — I2102 ST elevation (STEMI) myocardial infarction involving left anterior descending coronary artery: Secondary | ICD-10-CM

## 2017-01-18 DIAGNOSIS — E785 Hyperlipidemia, unspecified: Secondary | ICD-10-CM | POA: Diagnosis not present

## 2017-01-18 DIAGNOSIS — Z79899 Other long term (current) drug therapy: Secondary | ICD-10-CM

## 2017-01-18 MED ORDER — SACUBITRIL-VALSARTAN 49-51 MG PO TABS: 1.0000 | ORAL_TABLET | Freq: Two times a day (BID) | ORAL | 6 refills | Status: DC

## 2017-01-18 NOTE — Patient Instructions (Addendum)
Your physician has recommended you make the following change in your medication:   1.) the [name] has been increased from 24/26 to 49/51. Samples have been provided along with a hand written prescription for the NEW dose.   Your physician recommends that you return for lab work prior to your next visit. You WILL NOT need to fast.  Your physician recommends that you schedule a follow-up appointment in: 4-6 weeks with Dr Claiborne Billings.

## 2017-01-18 NOTE — Progress Notes (Signed)
Cardiology Office Note    Date:  01/19/2017   ID:  Tyler Chavez, DOB 09/20/1951, MRN [id number]  PCP:  Glo Herring, MD  Cardiologist:  Shelva Majestic, MD   Chief Complaint  Patient presents with  . Follow-up    3-4 weeks  . Chest Pain    History of Present Illness:  Tyler Chavez is a 66 y.o. male  Who suffered an anterior wall myocardial infarction in January 2018. I saw him on 12/24/2016 for his initial office follow-up.  He presents now for a four-week follow-up evaluation.   Tyler Chavez has a history of hypertension valve.  New-onset chest pain Henry 10 2018 at approximately 11 PM.  Chest pain persisted throughout the night and ultimately on the morning of January 11 [place] to [facility].  His ECG showed findings compatible with an acute anterior STEMI.  He was transferred urgently to [facility] hospital right performed emergent cardiac catheterization.  Authorization revealed total occlusion of the LAD at the ostium, 75% proximal circumflex stenosis, 95% proximal RCA stenosis, and 95% PLA stenoses.  He underwent successful PCI to his totally occluded LAD with ultimate insertion of a 4.015 mm Resolute onyx DES stent postdilated to 4.1 mm with the percent occlusion being reduced to 0% and restoration of TIMI-3 flow.  He initially had hypotension and cardiogenic shock.  It was acute ischemic cardiopathy and required dopamine and fluid resuscitation.  7 days later.  He was brought back to the catheterization laboratory and underwent staged PCI to his PLA vessel and proximal RCA.  His initial echo showed an EF 25-30%.  Several days later showed his EF increased to 35%.  On gender 38 2018.  He was seen in the office by Brittney straighter.  Currently, he has been pain-free.  He has been on atorvastatin 80 mg for hyperlipidemia, carvedilol 3.125 mg twice a day, furosemide 20 mg, lisinopril 2.5 mg, and spironolactone 12.5 mg daily.  He is on aspirin and Brilinta for dual antiplatelet therapy.  He  presents for reevaluation.  When I saw him one month ago, with his echo Doppler study showed reduced LV function, and class II [place] Heart Association symptoms, I recommended he discontinue lisinopril for at least 36 hours and then initiated and trest oh at 24/26 6 twice a day.  He has felt well on this regimen.  Subsequent laboratory revealed a BUN of 13 and creatinine of 0.82.  Potassium was 4.1.  He has been on atorvastatin 80 mg since his MI and lipid studies were excellent with a total cholesterol 108, triglycerides 69, LDL 53.  He presents for follow-up evaluation.   Past Medical History:  Diagnosis Date  . Coronary artery disease    a. 09/2016: Anterior STEMI with 100% Ost LAD stenosis, 95% Post Atrio stenosis, 90% Prox RCA stenosis, and 75% Ost to Prox Cx stenosis. DES to LAD. Staged DES to Post Atrio and DES to RCA performed later in admission.  . ED (erectile dysfunction)   . Frequency of urination   . GERD (gastroesophageal reflux disease)   . Hyperlipidemia   . Hypertension   . Ischemic cardiomyopathy    a. 09/2016: echo showing EF of 30-35% with Grade 2 DD  . Myocardial infarction ([place])   . Pneumonia   . Prostate cancer [facility]) urologist- dr wrenn/  oncologist-  dr Tammi Klippel   Stage T1c, Gleason 3+3, PSA 8.2, vol 49.9grams  . Rash    lower right leg  . Wears glasses  Past Surgical History:  Procedure Laterality Date  . CARDIAC CATHETERIZATION N/A 10/07/2016   Procedure: Left Heart Cath and Coronary Angiography;  Surgeon: Troy Sine, MD;  Location: [place] [facility];  Service: Cardiovascular;  Laterality: N/A;  . CARDIAC CATHETERIZATION N/A 10/07/2016   Procedure: Coronary Stent Intervention;  Surgeon: Troy Sine, MD;  Location: [place] [facility];  Service: Cardiovascular;  Laterality: N/A;  . CARDIAC CATHETERIZATION N/A 10/11/2016   Procedure: Coronary Stent Intervention;  Surgeon: Peter M Martinique, MD;  Location: [place] [facility];  Service: Cardiovascular;   Laterality: N/A;  . CYSTOSCOPY  02/19/2016   Procedure: CYSTOSCOPY;  Surgeon: Irine Seal, MD;  Location: [facility];  Service: Urology;;  no seeds visualized in bladder  . NO PAST SURGERIES    . RADIOACTIVE SEED IMPLANT N/A 02/19/2016   Procedure: RADIOACTIVE SEED IMPLANT/BRACHYTHERAPY IMPLANT;  Surgeon: Irine Seal, MD;  Location: [facility];  Service: Urology;  Laterality: N/A;  79   seeds implanted    Current Medications: Outpatient Medications Prior to Visit  Medication Sig Dispense Refill  . aspirin EC 81 MG tablet Take 81 mg by mouth daily.    [name] atorvastatin (LIPITOR) 80 MG tablet Take 1 tablet (80 mg total) by mouth daily at 6 PM. 30 tablet 12  . calcium carbonate (TUMS - DOSED IN MG ELEMENTAL CALCIUM) 500 MG chewable tablet Chew 1 tablet by mouth as needed for indigestion or heartburn.    . carvedilol (COREG) 3.125 MG tablet Take 1 & 1/2 tablet twice a day 90 tablet 6  . furosemide (LASIX) 20 MG tablet Take 1 tablet (20 mg total) by mouth daily. 30 tablet 12  . Multiple Vitamin (MULTIVITAMIN) tablet Take 1 tablet by mouth daily.    [name] spironolactone (ALDACTONE) 25 MG tablet Take 0.5 tablets (12.5 mg total) by mouth daily. 15 tablet 12  . tamsulosin (FLOMAX) 0.4 MG CAPS capsule Take 1 capsule (0.4 mg total) by mouth daily. 30 capsule 1  . ticagrelor (BRILINTA) 90 MG TABS tablet Take 1 tablet (90 mg total) by mouth 2 (two) times daily. 60 tablet 12  . sacubitril-valsartan (ENTRESTO) 24-26 MG Take 1 tablet by mouth 2 (two) times daily. 60 tablet 6   No facility-administered medications prior to visit.      Allergies:   Patient has no known allergies.   Social History   Social History  . Marital status: Widowed    Spouse name: N/A  . Number of children: N/A  . Years of education: N/A   Social History Main Topics  . Smoking status: Former Smoker    Packs/day: 1.50    Years: 42.00    Types: Cigarettes    Quit date: 02/11/2014  . Smokeless tobacco:  Never Used  . Alcohol use Yes     Comment: occasional  . Drug use: No  . Sexual activity: Not Asked   Other Topics Concern  . None   Social History Narrative  . None     Family History:  The patient's family history includes Heart attack in his mother.   ROS General: Negative; No fevers, chills, or night sweats;  HEENT: Negative; No changes in vision or hearing, sinus congestion, difficulty swallowing Pulmonary: Negative; No cough, wheezing, shortness of breath, hemoptysis Cardiovascular:  See HPI GI: Negative; No nausea, vomiting, diarrhea, or abdominal pain GU: Negative; No dysuria, hematuria, or difficulty voiding Musculoskeletal: Negative; no myalgias, joint pain, or weakness Hematologic/Oncology: Negative; no easy bruising, bleeding  Endocrine: Negative; no heat/cold intolerance; no diabetes Neuro: Negative; no changes in balance, headaches Skin: Negative; No rashes or skin lesions Psychiatric: Negative; No behavioral problems, depression Sleep: Negative; No snoring, daytime sleepiness, hypersomnolence, bruxism, restless legs, hypnogognic hallucinations, no cataplexy Other comprehensive 14 point system review is negative.   PHYSICAL EXAM:   VS:  BP 124/78   Pulse 68   Ht 6' 2"  (1.88 m)   Wt 198 lb (89.8 kg)   BMI 25.42 kg/m    Wt Readings from Last 3 Encounters:  01/18/17 198 lb (89.8 kg)  12/22/16 196 lb 3.2 oz (89 kg)  10/21/16 204 lb (92.5 kg)    General: Alert, oriented, no distress.  Skin: normal turgor, no rashes, warm and dry HEENT: Normocephalic, atraumatic. Pupils equal round and reactive to light; sclera anicteric; extraocular muscles intact; Fundi without hemorrhages or exudates Nose without nasal septal hypertrophy Mouth/Parynx benign; Mallinpatti scale 3 Neck: No JVD, no carotid bruits; normal carotid upstroke Lungs: clear to ausculatation and percussion; no wheezing or rales Chest wall: without tenderness to palpitation Heart: PMI not displaced,  RRR, s1 s2 normal, 1/6 systolic murmur, no diastolic murmur, no rubs, gallops, thrills, or heaves Abdomen: soft, nontender; no hepatosplenomehaly, BS+; abdominal aorta nontender and not dilated by palpation. Back: no CVA tenderness Pulses 2+ Musculoskeletal: full range of motion, normal strength, no joint deformities Extremities: no clubbing cyanosis or edema, Homan's sign negative  Neurologic: grossly nonfocal; Cranial nerves grossly wnl Psychologic: Normal mood and affect   Studies/Labs Reviewed:   EKG:  ordered today.  ECG (independently read by me): Normal sinus rhythm at 68 with mild sinus arrhythmia.  Normal intervals.  Incomplete right bundle branch block with repolarization changes.  Left anterior hemiblock.  12/24/16 ECG (independently read by me): Normal sinus rhythm at 73 bpm.  Incomplete right bundle branch block.  Left anterior hemiblock, septal infarct.  Recent Labs: BMP Latest Ref Rng & Units 01/12/2017 10/21/2016 10/12/2016  Glucose 65 - 99 mg/dL 111(H) 111(H) 105(H)  BUN 7 - 25 mg/dL 13 21 17   Creatinine 0.70 - 1.25 mg/dL 0.82 0.93 0.96  Sodium 135 - 146 mmol/L 141 139 138  Potassium 3.5 - 5.3 mmol/L 4.1 4.6 3.4(L)  Chloride 98 - 110 mmol/L 105 99 101  CO2 20 - 31 mmol/L 31 28 25   Calcium 8.6 - 10.3 mg/dL 8.9 9.8 8.9     Hepatic Function Latest Ref Rng & Units 01/12/2017 02/12/2016  Total Protein 6.1 - 8.1 g/dL 6.1 7.4  Albumin 3.6 - 5.1 g/dL 3.8 4.5  AST 10 - 35 U/L 16 19  ALT 9 - 46 U/L 14 17  Alk Phosphatase 40 - 115 U/L 73 68  Total Bilirubin 0.2 - 1.2 mg/dL 0.7 0.7    CBC Latest Ref Rng & Units 01/12/2017 10/12/2016 10/11/2016  WBC 3.8 - 10.8 K/uL 4.2 5.2 6.0  Hemoglobin 13.2 - 17.1 g/dL 13.4 12.4(L) 13.7  Hematocrit 38.5 - 50.0 % 41.4 37.5(L) 41.0  Platelets 140 - 400 K/uL 229 215 201   Lab Results  Component Value Date   MCV 96.1 01/12/2017   MCV 91.7 10/12/2016   MCV 92.3 10/11/2016   Lab Results  Component Value Date   TSH 2.02 01/12/2017   Lab  Results  Component Value Date   HGBA1C 4.9 10/08/2016     BNP No results found for: BNP  ProBNP No results found for: PROBNP   Lipid Panel     Component Value Date/Time   CHOL 108  01/12/2017 0823   TRIG 69 01/12/2017 0823   HDL 41 01/12/2017 0823   CHOLHDL 2.6 01/12/2017 0823   VLDL 14 01/12/2017 0823   LDLCALC 53 01/12/2017 0823     RADIOLOGY: No results found.   Additional studies/ records that were reviewed include:  I reviewed his hospitalization, cardia catheterization and intervention reports, subsequent office visit,  Laboratory and echo Doppler study.    ASSESSMENT:    1. Status post coronary artery stent placement   2. STEMI involving left anterior descending coronary artery ([place])   3. Medication management   4. Cardiomyopathy, ischemic   5. Hyperlipidemia LDL goal <70      PLAN:  Tyler Chavez is a 66 year old African-American male who suffered an anterior wall ST segment elevation MI secondary to total occlusion of the LAD at the ostium on 10/07/2016.  He underwent successful PCI with stenting and several days later underwent staged PCI to his proximal RCA and his distal posterolateral branch.  He had initial proximal circumflex disease but on repeat evaluation was improved and is on medical therapy.  When I last saw him, with his reduced LV function, I discontinued lisinopril and switched him to ARNI with Entresto 24/26 bid.  Over the past month, he has felt well on this therapy.  His blood pressure today remains stable.  His renal function is excellent and potassium normal.  I will further titrate entresto to 49/51 mg twice a day.  He will continue with low-dose spironolactone at 12.5 mg daily, furosemide 20 mg daily, carvedilol 1-1/2 of a 3.125 mg twice a day pill in addition to his dual antiplatelet therapy.  Ultimately, I will further titrate carvedilol and if he remains stable with good blood pressure in 1-2 months will try to titrate him to 97/103  twice a day of entresto. A repeat echo Doppler study will obtained in several months following  further maximization of medication to see if LV function improves.  If his LV function remains depressed on good medical therapy, he will then need referral  for prophylactic ICD implantation.  I reviewed his recent laboratory.  Lipid studies are excellent on atorvastatin 80 mg with an LDL of 53.  He is not having any bleeding on dual antiplatelet therapy.  I willl see him in 4 -6 weeks for reevaluation.   Medication Adjustments/Labs and Tests Ordered: Current medicines are reviewed at length with the patient today.  Concerns regarding medicines are outlined above.  Medication changes, Labs and Tests ordered today are listed in the Patient Instructions below. Patient Instructions  Your physician has recommended you make the following change in your medication:   1.) the [name] has been increased from 24/26 to 49/51. Samples have been provided along with a hand written prescription for the NEW dose.   Your physician recommends that you return for lab work prior to your next visit. You WILL NOT need to fast.  Your physician recommends that you schedule a follow-up appointment in: 4-6 weeks with Dr Claiborne Billings.       Richard Miu, MD,FACC 01/19/2017 [facility] [address], [place], [place], [place]  [address] Phone: [phone]

## 2017-02-08 ENCOUNTER — Ambulatory Visit: Payer: PPO | Admitting: Cardiovascular Disease

## 2017-02-09 DIAGNOSIS — Z79899 Other long term (current) drug therapy: Secondary | ICD-10-CM | POA: Diagnosis not present

## 2017-02-10 LAB — BASIC METABOLIC PANEL
BUN: 11 mg/dL (ref 7–25)
CALCIUM: 9.1 mg/dL (ref 8.6–10.3)
CO2: 23 mmol/L (ref 20–31)
Chloride: 104 mmol/L (ref 98–110)
Creat: 0.99 mg/dL (ref 0.70–1.25)
Glucose, Bld: 147 mg/dL — ABNORMAL HIGH (ref 65–99)
POTASSIUM: 4 mmol/L (ref 3.5–5.3)
Sodium: 139 mmol/L (ref 135–146)

## 2017-02-18 ENCOUNTER — Ambulatory Visit (INDEPENDENT_AMBULATORY_CARE_PROVIDER_SITE_OTHER): Payer: PPO | Admitting: Cardiovascular Disease

## 2017-02-18 ENCOUNTER — Encounter: Payer: Self-pay | Admitting: Cardiovascular Disease

## 2017-02-18 VITALS — BP 110/84 | HR 64 | Ht 74.0 in | Wt 198.0 lb

## 2017-02-18 DIAGNOSIS — Z79899 Other long term (current) drug therapy: Secondary | ICD-10-CM

## 2017-02-18 DIAGNOSIS — I2109 ST elevation (STEMI) myocardial infarction involving other coronary artery of anterior wall: Secondary | ICD-10-CM

## 2017-02-18 DIAGNOSIS — Z955 Presence of coronary angioplasty implant and graft: Secondary | ICD-10-CM

## 2017-02-18 DIAGNOSIS — I2510 Atherosclerotic heart disease of native coronary artery without angina pectoris: Secondary | ICD-10-CM | POA: Diagnosis not present

## 2017-02-18 DIAGNOSIS — I255 Ischemic cardiomyopathy: Secondary | ICD-10-CM

## 2017-02-18 DIAGNOSIS — E785 Hyperlipidemia, unspecified: Secondary | ICD-10-CM

## 2017-02-18 MED ORDER — SACUBITRIL-VALSARTAN 97-103 MG PO TABS: 1.0000 | ORAL_TABLET | Freq: Two times a day (BID) | ORAL | 11 refills | Status: DC

## 2017-02-18 MED ORDER — FUROSEMIDE 20 MG PO TABS: 10.0000 mg | ORAL_TABLET | Freq: Every day | ORAL | 12 refills | Status: DC

## 2017-02-18 NOTE — Progress Notes (Signed)
Cardiology Office Note    Date:  02/18/2017   ID:  Tyler Chavez, DOB 09-Mar-1951, MRN [id number]  PCP:  Redmond School, MD  Cardiologist:  Shelva Majestic, MD   Chief Complaint  Patient presents with  . Follow-up    pt has no complaints     History of Present Illness:  Tyler Chavez is a 66 y.o. male who presents for two-month follow-up cardiology evaluation.  [name] has a history of hypertension valve.  New-onset chest pain Henry 10 2018 at approximately 11 PM.  Chest pain persisted throughout the night and ultimately on the morning of January 11 [place] to [facility].  His ECG showed findings compatible with an acute anterior STEMI.  He was transferred urgently to [facility] hospital right performed emergent cardiac catheterization.  Authorization revealed total occlusion of the LAD at the ostium, 75% proximal circumflex stenosis, 95% proximal RCA stenosis, and 95% PLA stenoses.  He underwent successful PCI to his totally occluded LAD with ultimate insertion of a 4.015 mm Resolute onyx DES stent postdilated to 4.1 mm with the percent occlusion being reduced to 0% and restoration of TIMI-3 flow.  He initially had hypotension and cardiogenic shock.  It was acute ischemic cardiopathy and required dopamine and fluid resuscitation.  7 days later.  He was brought back to the catheterization laboratory and underwent staged PCI to his PLA vessel and proximal RCA.  His initial echo showed an EF 25-30%.  Several days later showed his EF increased to 35%.   When I saw him for initial evaluation, I recommended discontinuance of lisinopril and switched him to entresto 24/26.  He had tolerated that well.  When I's last saw him 2 months ago he was doing well and entresto was further titrated to 49/51 mg twice a day.  He has continued to be on spironolactone at 12.5 g daily, furosemide 20 mg daily, and carvedilol 1-1/2 pills of a 3.125 mg tablet twice a day.  He continues to wear a LifeVest.  He is unaware of  any discharges.  His breathing is better.  He denies shortness of breath.  He denies edema.  He presents now for follow-up evaluation.   Past Medical History:  Diagnosis Date  . Coronary artery disease    a. 09/2016: Anterior STEMI with 100% Ost LAD stenosis, 95% Post Atrio stenosis, 90% Prox RCA stenosis, and 75% Ost to Prox Cx stenosis. DES to LAD. Staged DES to Post Atrio and DES to RCA performed later in admission.  . ED (erectile dysfunction)   . Frequency of urination   . GERD (gastroesophageal reflux disease)   . Hyperlipidemia   . Hypertension   . Ischemic cardiomyopathy    a. 09/2016: echo showing EF of 30-35% with Grade 2 DD  . Myocardial infarction ([place])   . Pneumonia   . Prostate cancer [facility]) urologist- dr wrenn/  oncologist-  dr Tammi Klippel   Stage T1c, Gleason 3+3, PSA 8.2, vol 49.9grams  . Rash    lower right leg  . Wears glasses     Past Surgical History:  Procedure Laterality Date  . CARDIAC CATHETERIZATION N/A 10/07/2016   Procedure: Left Heart Cath and Coronary Angiography;  Surgeon: Troy Sine, MD;  Location: [place] [facility];  Service: Cardiovascular;  Laterality: N/A;  . CARDIAC CATHETERIZATION N/A 10/07/2016   Procedure: Coronary Stent Intervention;  Surgeon: Troy Sine, MD;  Location: [place] [facility];  Service: Cardiovascular;  Laterality: N/A;  . CARDIAC CATHETERIZATION N/A  10/11/2016   Procedure: Coronary Stent Intervention;  Surgeon: Peter M Martinique, MD;  Location: [place] [facility];  Service: Cardiovascular;  Laterality: N/A;  . CYSTOSCOPY  02/19/2016   Procedure: CYSTOSCOPY;  Surgeon: Irine Seal, MD;  Location: [facility];  Service: Urology;;  no seeds visualized in bladder  . NO PAST SURGERIES    . RADIOACTIVE SEED IMPLANT N/A 02/19/2016   Procedure: RADIOACTIVE SEED IMPLANT/BRACHYTHERAPY IMPLANT;  Surgeon: Irine Seal, MD;  Location: [facility];  Service: Urology;  Laterality: N/A;  79   seeds implanted    Current  Medications: Outpatient Medications Prior to Visit  Medication Sig Dispense Refill  . aspirin EC 81 MG tablet Take 81 mg by mouth daily.    [name] atorvastatin (LIPITOR) 80 MG tablet Take 1 tablet (80 mg total) by mouth daily at 6 PM. 30 tablet 12  . calcium carbonate (TUMS - DOSED IN MG ELEMENTAL CALCIUM) 500 MG chewable tablet Chew 1 tablet by mouth as needed for indigestion or heartburn.    . carvedilol (COREG) 3.125 MG tablet Take 1 & 1/2 tablet twice a day 90 tablet 6  . Multiple Vitamin (MULTIVITAMIN) tablet Take 1 tablet by mouth daily.    [name] spironolactone (ALDACTONE) 25 MG tablet Take 0.5 tablets (12.5 mg total) by mouth daily. 15 tablet 12  . tamsulosin (FLOMAX) 0.4 MG CAPS capsule Take 1 capsule (0.4 mg total) by mouth daily. 30 capsule 1  . ticagrelor (BRILINTA) 90 MG TABS tablet Take 1 tablet (90 mg total) by mouth 2 (two) times daily. 60 tablet 12  . furosemide (LASIX) 20 MG tablet Take 1 tablet (20 mg total) by mouth daily. 30 tablet 12  . sacubitril-valsartan (ENTRESTO) 49-51 MG Take 1 tablet by mouth 2 (two) times daily. 60 tablet 6   No facility-administered medications prior to visit.      Allergies:   Patient has no known allergies.   Social History   Social History  . Marital status: Widowed    Spouse name: N/A  . Number of children: N/A  . Years of education: N/A   Social History Main Topics  . Smoking status: Former Smoker    Packs/day: 1.50    Years: 42.00    Types: Cigarettes    Quit date: 02/11/2014  . Smokeless tobacco: Never Used  . Alcohol use Yes     Comment: occasional  . Drug use: No  . Sexual activity: Not Asked   Other Topics Concern  . None   Social History Narrative  . None     Family History:  The patient's family history includes Heart attack in his mother.   ROS General: Negative; No fevers, chills, or night sweats;  HEENT: Negative; No changes in vision or hearing, sinus congestion, difficulty swallowing Pulmonary: Negative; No  cough, wheezing, shortness of breath, hemoptysis Cardiovascular:  See HPI GI: Negative; No nausea, vomiting, diarrhea, or abdominal pain GU: Negative; No dysuria, hematuria, or difficulty voiding Musculoskeletal: Negative; no myalgias, joint pain, or weakness Hematologic/Oncology: Negative; no easy bruising, bleeding Endocrine: Negative; no heat/cold intolerance; no diabetes Neuro: Negative; no changes in balance, headaches Skin: Negative; No rashes or skin lesions Psychiatric: Negative; No behavioral problems, depression Sleep: Negative; No snoring, daytime sleepiness, hypersomnolence, bruxism, restless legs, hypnogognic hallucinations, no cataplexy Other comprehensive 14 point system review is negative.   PHYSICAL EXAM:   VS:  BP 110/84 (BP Location: Right Arm, Patient Position: Sitting, Cuff Size: Normal)   Pulse 64  Ht 6' 2"  (1.88 m)   Wt 198 lb (89.8 kg)   BMI 25.42 kg/m     Repeat blood pressure by me 112/84.  Wt Readings from Last 3 Encounters:  02/18/17 198 lb (89.8 kg)  01/18/17 198 lb (89.8 kg)  12/22/16 196 lb 3.2 oz (89 kg)    General: Alert, oriented, no distress.  Skin: normal turgor, no rashes, warm and dry HEENT: Normocephalic, atraumatic. Pupils equal round and reactive to light; sclera anicteric; extraocular muscles intact;  Nose without nasal septal hypertrophy Mouth/Parynx benign; Mallinpatti scale 3 Neck: No JVD, no carotid bruits; normal carotid upstroke Lungs: clear to ausculatation and percussion; no wheezing or rales Chest wall: without tenderness to palpitation Heart: PMI not displaced, RRR, s1 s2 normal, 1/6 systolic murmur, no diastolic murmur, no rubs, gallops, thrills, or heaves Abdomen: soft, nontender; no hepatosplenomehaly, BS+; abdominal aorta nontender and not dilated by palpation. Back: no CVA tenderness Pulses 2+ Musculoskeletal: full range of motion, normal strength, no joint deformities Extremities: no clubbing cyanosis or edema,  Homan's sign negative  Neurologic: grossly nonfocal; Cranial nerves grossly wnl Psychologic: Normal mood and affect; normal cognition    Studies/Labs Reviewed:   EKG:  EKG is ordered today.  ECG (independently read by me): Sinus rhythm at 64 bpm with sinus arrhythmia.  Left anterior hemiblock.  Old anterior MI changes with QRS complex V1 through V4.  Lateral T-wave abnormality.  March 2018 ECG (independently read by me): Normal sinus rhythm at 73 bpm.  Incomplete right bundle branch block.  Left anterior hemiblock, septal infarct.  Recent Labs: BMP Latest Ref Rng & Units 02/09/2017 01/12/2017 10/21/2016  Glucose 65 - 99 mg/dL 147(H) 111(H) 111(H)  BUN 7 - 25 mg/dL 11 13 21   Creatinine 0.70 - 1.25 mg/dL 0.99 0.82 0.93  Sodium 135 - 146 mmol/L 139 141 139  Potassium 3.5 - 5.3 mmol/L 4.0 4.1 4.6  Chloride 98 - 110 mmol/L 104 105 99  CO2 20 - 31 mmol/L 23 31 28   Calcium 8.6 - 10.3 mg/dL 9.1 8.9 9.8     Hepatic Function Latest Ref Rng & Units 01/12/2017 02/12/2016  Total Protein 6.1 - 8.1 g/dL 6.1 7.4  Albumin 3.6 - 5.1 g/dL 3.8 4.5  AST 10 - 35 U/L 16 19  ALT 9 - 46 U/L 14 17  Alk Phosphatase 40 - 115 U/L 73 68  Total Bilirubin 0.2 - 1.2 mg/dL 0.7 0.7    CBC Latest Ref Rng & Units 01/12/2017 10/12/2016 10/11/2016  WBC 3.8 - 10.8 K/uL 4.2 5.2 6.0  Hemoglobin 13.2 - 17.1 g/dL 13.4 12.4(L) 13.7  Hematocrit 38.5 - 50.0 % 41.4 37.5(L) 41.0  Platelets 140 - 400 K/uL 229 215 201   Lab Results  Component Value Date   MCV 96.1 01/12/2017   MCV 91.7 10/12/2016   MCV 92.3 10/11/2016   Lab Results  Component Value Date   TSH 2.02 01/12/2017   Lab Results  Component Value Date   HGBA1C 4.9 10/08/2016     BNP No results found for: BNP  ProBNP No results found for: PROBNP   Lipid Panel     Component Value Date/Time   CHOL 108 01/12/2017 0823   TRIG 69 01/12/2017 0823   HDL 41 01/12/2017 0823   CHOLHDL 2.6 01/12/2017 0823   VLDL 14 01/12/2017 0823   LDLCALC 53 01/12/2017  0823     RADIOLOGY: No results found.   Additional studies/ records that were reviewed today include:  I reviewed  his hospitalization, cardiac catheterization and intervention reports, subsequent office visit,  Laboratory f/u on entresto and echo Doppler study.     ASSESSMENT:    1. CAD in native artery   2. ST elevation myocardial infarction (STEMI) of anterior wall, subsequent episode of care ([place])   3. Status post coronary artery stent placement   4. Medication management   5. Hyperlipidemia LDL goal <70   6. Cardiomyopathy, ischemic      PLAN:  Mr. Kanin Lia is a 66 year old African-American male who suffered an anterior wall ST segment elevation MI secondary to total occlusion of the LAD at the ostium on 10/07/2016.  He underwent successful PCI with stenting and several days later underwent staged PCI to his proximal RCA and his distal posterolateral branch.  He had initial proximal circumflex disease but on repeat evaluation this was improved and is on medical therapy.  Over the past several months, I have been initiating and titrating interest oh.  Presently, he feels improved on his current dose of 49/51 and continues to take spironolactone 12.5 g daily, furosemide 20 g, and 1-1/2 of a 3.125 mg twice a day regimen of carvedilol.  His blood pressure today is stable.  He continues to have a LifeVest and has not had any discharges.  Subsequent blood work on his increased interest and was stable with a creatinine of 0.99 and potassium of 4.0.  Further titrating entresto 97/103.  I will decrease Lasix from 20 mg, down to 10 mg daily, but if he notes his blood pressure gets low.  He can discontinue this and only take this when necessary.  In 2 months, I am scheduling him for an echo Doppler study to reevaluate his LV function.  Hopefully LV function has improved.  However, his ECG does show QS complex in V1 through V4.  If he continues to have reduced LV function, I will then refer him  for prophylactic ICD implantation.  He will have follow-up blood work in 2 weeks after the increased entresto dose.  Most recent lipid studies are excellent on atorvastatin 80 mg with a total cholesterol 108, LDL 53.  I will see him in 2 months for reevaluation.  Medication Adjustments/Labs and Tests Ordered: Current medicines are reviewed at length with the patient today.  Concerns regarding medicines are outlined above.  Medication changes, Labs and Tests ordered today are listed in the Patient Instructions below. Patient Instructions  Your physician has recommended you make the following change in your medication:   1.) when you complete your current dose of Entresto. Start new 97/103 higher dose.  2.) once you begin the higher dose of the Entresto, cut the furosemide to 10 mg daily ( 1/2 tablet) if no swelling noted you may take only as needed.  Your physician recommends that you return for lab work here in our office 2 weeks after starting the higher dose of Entresto.   Your physician has requested that you have an echocardiogram in 2 months. Echocardiography is a painless test that uses sound waves to create images of your heart. It provides your doctor with information about the size and shape of your heart and how well your heart's chambers and valves are working. This procedure takes approximately one hour. There are no restrictions for this procedure.  Your physician recommends that you schedule a follow-up appointment in: 2 months.        Signed, Shelva Majestic, MD  02/18/2017 11:03 AM    [facility]  [address], [place], [place], [place]  [address] Phone: [phone]

## 2017-02-18 NOTE — Patient Instructions (Addendum)
Your physician has recommended you make the following change in your medication:   1.) when you complete your current dose of Entresto. Start new 97/103 higher dose.  2.) once you begin the higher dose of the Entresto, cut the furosemide to 10 mg daily ( 1/2 tablet) if no swelling noted you may take only as needed.  Your physician recommends that you return for lab work here in our office 2 weeks after starting the higher dose of Entresto.   Your physician has requested that you have an echocardiogram in 2 months. Echocardiography is a painless test that uses sound waves to create images of your heart. It provides your doctor with information about the size and shape of your heart and how well your heart's chambers and valves are working. This procedure takes approximately one hour. There are no restrictions for this procedure.  Your physician recommends that you schedule a follow-up appointment in: 2 months.

## 2017-03-08 ENCOUNTER — Other Ambulatory Visit: Payer: Self-pay | Admitting: Cardiovascular Disease

## 2017-03-09 ENCOUNTER — Telehealth: Payer: Self-pay | Admitting: *Deleted

## 2017-03-09 NOTE — Telephone Encounter (Signed)
Prior Authorization submitted to cover my meds for patients Entresto.

## 2017-03-09 NOTE — Telephone Encounter (Signed)
PA FOR ENTRESTO STARTED

## 2017-03-15 ENCOUNTER — Telehealth: Payer: Self-pay | Admitting: Cardiovascular Disease

## 2017-03-15 NOTE — Telephone Encounter (Signed)
Returned call to [employer] with [employer] [facility].  Advised per chart review-   Ischemic cardiomyopathy    a. 09/2016: echo showing EF of 30-35% with Grade 2 DD    Also advised patient is not taking another ACE inhibitor.  Verbalized understanding and can now process the PA.

## 2017-03-15 NOTE — Telephone Encounter (Signed)
New message   Pt c/o medication issue:  1. Name of Medication: entresto 2. How are you currently taking this medication (dosage and times per day)? 97mg  over 103 mg   3. Are you having a reaction (difficulty breathing--STAT)? no 4. What is your medication issue? Need to know the NYHA class, will the pt be taking with an ace inhibitor

## 2017-03-18 NOTE — Telephone Encounter (Signed)
entresto coverage determination is approved.

## 2017-03-28 DIAGNOSIS — Z79899 Other long term (current) drug therapy: Secondary | ICD-10-CM | POA: Diagnosis not present

## 2017-03-28 DIAGNOSIS — R3121 Asymptomatic microscopic hematuria: Secondary | ICD-10-CM | POA: Diagnosis not present

## 2017-03-28 LAB — BASIC METABOLIC PANEL
BUN / CREAT RATIO: 16 (ref 10–24)
BUN: 14 mg/dL (ref 8–27)
CHLORIDE: 101 mmol/L (ref 96–106)
CO2: 22 mmol/L (ref 20–29)
Calcium: 9.2 mg/dL (ref 8.6–10.2)
Creatinine, Ser: 0.9 mg/dL (ref 0.76–1.27)
GFR calc Af Amer: 103 mL/min/{1.73_m2} (ref >59)
GFR calc non Af Amer: 89 mL/min/{1.73_m2} (ref >59)
GLUCOSE: 105 mg/dL — AB (ref 65–99)
POTASSIUM: 4.2 mmol/L (ref 3.5–5.2)
SODIUM: 139 mmol/L (ref 134–144)

## 2017-04-04 ENCOUNTER — Encounter: Payer: Self-pay | Admitting: *Deleted

## 2017-04-04 DIAGNOSIS — R3121 Asymptomatic microscopic hematuria: Secondary | ICD-10-CM | POA: Diagnosis not present

## 2017-04-04 DIAGNOSIS — Z8546 Personal history of malignant neoplasm of prostate: Secondary | ICD-10-CM | POA: Diagnosis not present

## 2017-04-05 ENCOUNTER — Other Ambulatory Visit: Payer: Self-pay

## 2017-04-05 ENCOUNTER — Ambulatory Visit (HOSPITAL_COMMUNITY): Payer: PPO | Attending: Internal Medicine

## 2017-04-05 DIAGNOSIS — I255 Ischemic cardiomyopathy: Secondary | ICD-10-CM | POA: Diagnosis not present

## 2017-04-05 DIAGNOSIS — I2510 Atherosclerotic heart disease of native coronary artery without angina pectoris: Secondary | ICD-10-CM | POA: Insufficient documentation

## 2017-04-05 DIAGNOSIS — I10 Essential (primary) hypertension: Secondary | ICD-10-CM | POA: Insufficient documentation

## 2017-04-05 DIAGNOSIS — E785 Hyperlipidemia, unspecified: Secondary | ICD-10-CM | POA: Insufficient documentation

## 2017-04-05 DIAGNOSIS — Z87891 Personal history of nicotine dependence: Secondary | ICD-10-CM | POA: Insufficient documentation

## 2017-04-05 DIAGNOSIS — C61 Malignant neoplasm of prostate: Secondary | ICD-10-CM | POA: Diagnosis not present

## 2017-04-05 DIAGNOSIS — Z8249 Family history of ischemic heart disease and other diseases of the circulatory system: Secondary | ICD-10-CM | POA: Insufficient documentation

## 2017-04-05 DIAGNOSIS — I081 Rheumatic disorders of both mitral and tricuspid valves: Secondary | ICD-10-CM | POA: Insufficient documentation

## 2017-04-05 DIAGNOSIS — Z955 Presence of coronary angioplasty implant and graft: Secondary | ICD-10-CM | POA: Insufficient documentation

## 2017-04-05 DIAGNOSIS — I252 Old myocardial infarction: Secondary | ICD-10-CM | POA: Diagnosis not present

## 2017-04-18 ENCOUNTER — Encounter: Payer: Self-pay | Admitting: *Deleted

## 2017-04-18 ENCOUNTER — Other Ambulatory Visit: Payer: Self-pay | Admitting: *Deleted

## 2017-04-18 NOTE — Patient Outreach (Signed)
HTA THN Screen completed. Pt has CAD and a recent stent placement. He also has a hx of CHF. He weighs daily and follows HF action plan. He sees Dr. Shelva Majestic for cardiology needs and Dr. Gerarda Fraction as his primary MD. He also has a hx of prostate cancer and has had radioactive seed implants. He reports he is doing well in that perspective. He has quit smoking. In general he is very attentive to follow his MD recommendations. He has no nursing care management needs at present but says his [name] and Luna Kitchens are quit costly. He does not have any previous experience with going into the donut hole but very well may this year. I advised him we have a pharmacy team and they could possibly assist with his medications costs. He is very interested in this.   I am sending him an introductory letter for [facility] and referring him for pharmacy assistance.  Eulah Pont. Myrtie Neither, MSN, [facility] Gerontological Nurse Practitioner [facility] Care Management [phone]

## 2017-04-20 ENCOUNTER — Telehealth: Payer: Self-pay | Admitting: Pharmacist

## 2017-04-20 NOTE — Patient Outreach (Addendum)
[facility] [facility]) Care Management  [place]   04/20/2017  Tyler Chavez 14-May-1951 [id number]  Subjective: Patient was called regarding medication assistance per referral from [place] Practitioner, Bradd Canary.  HIPAA identifiers were obtained.  Patient is a 66 year old male with multiple medical conditions including but not coronary artery disease, ED, GERD, hyperlipidemia, hypertension, ischemic cardiomyopathy, pneumonia, history of prostate cancer and recent hospitalization for STEMI.     Patient reported managing his medication on his own.  He expressed affordability issues with Entresto due to it's $85 copay and Brillinta.    Objective:   Encounter Medications: Outpatient Encounter Prescriptions as of 04/20/2017  Medication Sig  . aspirin EC 81 MG tablet Take 81 mg by mouth daily.  [name] atorvastatin (LIPITOR) 80 MG tablet Take 1 tablet (80 mg total) by mouth daily at 6 PM.  . calcium carbonate (TUMS - DOSED IN MG ELEMENTAL CALCIUM) 500 MG chewable tablet Chew 1 tablet by mouth as needed for indigestion or heartburn.  . carvedilol (COREG) 3.125 MG tablet Take 1 & 1/2 tablet twice a day  . furosemide (LASIX) 20 MG tablet Take 0.5 tablets (10 mg total) by mouth daily.  . Multiple Vitamin (MULTIVITAMIN) tablet Take 1 tablet by mouth daily.  . sacubitril-valsartan (ENTRESTO) 97-103 MG Take 1 tablet by mouth 2 (two) times daily.  [name] spironolactone (ALDACTONE) 25 MG tablet Take 0.5 tablets (12.5 mg total) by mouth daily.  . tamsulosin (FLOMAX) 0.4 MG CAPS capsule Take 1 capsule (0.4 mg total) by mouth daily.  . ticagrelor (BRILINTA) 90 MG TABS tablet Take 1 tablet (90 mg total) by mouth 2 (two) times daily.   No facility-administered encounter medications on file as of 04/20/2017.     Functional Status: In your present state of health, do you have any difficulty performing the following activities: 10/07/2016  Hearing? N  Vision? N  Difficulty concentrating or making  decisions? N  Walking or climbing stairs? N  Dressing or bathing? N  Doing errands, shopping? N  Some recent data might be hidden    Fall/Depression Screening: Fall Risk  03/18/2016  Falls in the past year? No   PHQ 2/9 Scores 03/18/2016  PHQ - 2 Score 0      Assessment: Patient's medication were reconciled via telephone.  Drugs sorted by system:  Neurologic/Psychologic:  Cardiovascular: ASA Carvedilol Furosemide Spironolactone Brillinta Entresto atorvastatin  Vitamins/Minerals: Multivitamin  Gastrointestinal Tums  Medication Review Findings:  Patient may qualify for the extra help program or LIS through [employer].  An online application was completed today.  Patient should receive a determination letter in the mail within the next 2-3 weeks.  Patient knows to expect mail and will keep it in a safe place.  Patient meets the income requirements to receive Brillinta from [name] but he does not meet the requirement for Medicare Part D patients of spending 3% of their income on prescription expenditures.  ([place] Technician was called and she provided the patient's medication expenditures:  $385.35)  A telephone application was completed with the patient on conference for Entresto through the [employer]. Patient was instantly approved to receive $800 towards paying copays that is good through 04/19/18.  [id number] [phone]  [id number]-PANF  A card should be arriving at the patient's home within the next few weeks with the information above so he can take it to his pharmacy for billing.  After using the card, the patient's copay should be $0.00  Plan:  I will follow up with the patient in 1-2 weeks.  Route note to patient's PCP and Cardiologist  Follow up with the patient in 60 days to see if he has spent 3% of his income.  Elayne Guerin, PharmD, [place] Clinical Pharmacist [phone]

## 2017-05-02 ENCOUNTER — Other Ambulatory Visit: Payer: Self-pay | Admitting: Pharmacist

## 2017-05-02 NOTE — Patient Outreach (Addendum)
[place] [facility]) Care Management  05/02/2017  Tyler Chavez 1951/07/25 [id number]  Patient was called to follow up on medication assistance. Unfortunately, the patient did not answer and the voicemail on his phone was not set up. (No ability to leave a message).  Plan:  Call patient back in 7-10 business days.  Elayne Guerin, PharmD, [place] Clinical Pharmacist [phone]  ADDENDUM  Patient called me back.  HIPAA identifiers were obtained.  Patient confirmed he received the card from the [facility] and will be taking it to his pharmacy tomorrow to have Entresto refilled.  Patient knows to call me if he has any issues getting the Entresto filled.  He reported he has not heard anything from Social Security about the Extra Help Program  Plan:  Continue with previous plan to follow up with patient in 7-10 business days

## 2017-05-03 ENCOUNTER — Ambulatory Visit (INDEPENDENT_AMBULATORY_CARE_PROVIDER_SITE_OTHER): Payer: PPO | Admitting: Cardiovascular Disease

## 2017-05-03 ENCOUNTER — Encounter: Payer: Self-pay | Admitting: Cardiovascular Disease

## 2017-05-03 VITALS — BP 128/88 | HR 66 | Ht 74.0 in | Wt 197.4 lb

## 2017-05-03 DIAGNOSIS — Z955 Presence of coronary angioplasty implant and graft: Secondary | ICD-10-CM | POA: Diagnosis not present

## 2017-05-03 DIAGNOSIS — I255 Ischemic cardiomyopathy: Secondary | ICD-10-CM | POA: Diagnosis not present

## 2017-05-03 DIAGNOSIS — I2510 Atherosclerotic heart disease of native coronary artery without angina pectoris: Secondary | ICD-10-CM

## 2017-05-03 DIAGNOSIS — E785 Hyperlipidemia, unspecified: Secondary | ICD-10-CM | POA: Diagnosis not present

## 2017-05-03 DIAGNOSIS — I10 Essential (primary) hypertension: Secondary | ICD-10-CM

## 2017-05-03 MED ORDER — CARVEDILOL 6.25 MG PO TABS: 6.2500 mg | ORAL_TABLET | Freq: Two times a day (BID) | ORAL | 3 refills | Status: DC

## 2017-05-03 MED ORDER — FUROSEMIDE 20 MG PO TABS: 10.0000 mg | ORAL_TABLET | ORAL | 12 refills | Status: DC | PRN

## 2017-05-03 NOTE — Progress Notes (Signed)
Cardiology Office Note    Date:  05/03/2017   ID:  Tyler Chavez, DOB Aug 25, 1951, MRN [id number]  PCP:  Redmond School, MD  Cardiologist:  Shelva Majestic, MD   No chief complaint on file.   History of Present Illness:  Tyler Chavez is a 66 y.o. male who presents for two-month follow-up cardiology evaluation.  Tyler Chavez has a history of hypertension valve.  He developed new onset chest pain on 10/06/2016 at approximately 11 PM.  Chest pain persisted throughout the night and he presented to [facility].  His ECG showed findings compatible with an acute anterior STEMI.  He was transferred urgently to [facility] hospital right performed emergent cardiac catheterization which revealed total occlusion of the LAD at the ostium, 75% proximal circumflex stenosis, 95% proximal RCA stenosis, and 95% PLA stenoses.  He underwent successful PCI to his totally occluded LAD with ultimate insertion of a 4.015 mm Resolute onyx DES stent postdilated to 4.1 mm with the percent occlusion being reduced to 0% and restoration of TIMI-3 flow.  He initially had hypotension and cardiogenic shock.and required dopamine and fluid resuscitation.  7 days later he underwent staged PCI to his PLA vessel and proximal RCA.  His initial echo showed an EF 25-30%.  Several days later showed his EF increased to 35%.   When I saw him for initial evaluation, I recommended discontinuance of lisinopril and switched him to entresto 24/26.  He had tolerated that well.  When I's last saw him 2 months ago he was doing well and entresto was further titrated to 49/51 mg twice a day.  He has continued to be on spironolactone at 12.5 g daily, furosemide 20 mg daily, and carvedilol 1-1/2 pills of a 3.125 mg tablet twice a day.  He continues to wear a LifeVest.  He is unaware of any discharges.  His breathing is better.  He denies shortness of breath.  He denies edema.    When I last saw him, I further titrated entresto to 97/103, and  decreased his Lasix.  Over the last several months, he denies any awareness of palpitations.  He denies any recurrence of leg swelling.  Laboratory revealed a BUN of 14 and creatinine of 0.9.  He underwent a follow-up echo Doppler study on 04/05/2017.  This now showed improvement in LV function with an EF of 40-45% with previous anterior anteroseptal and apical hypokinesis.  There was grade 1 diastolic dysfunction.  He presents now for follow-up evaluation  Past Medical History:  Diagnosis Date  . Coronary artery disease    a. 09/2016: Anterior STEMI with 100% Ost LAD stenosis, 95% Post Atrio stenosis, 90% Prox RCA stenosis, and 75% Ost to Prox Cx stenosis. DES to LAD. Staged DES to Post Atrio and DES to RCA performed later in admission.  . ED (erectile dysfunction)   . Frequency of urination   . GERD (gastroesophageal reflux disease)   . Hyperlipidemia   . Hypertension   . Ischemic cardiomyopathy    a. 09/2016: echo showing EF of 30-35% with Grade 2 DD  . Myocardial infarction ([place])   . Pneumonia   . Prostate cancer [facility]) urologist- dr wrenn/  oncologist-  dr Tammi Klippel   Stage T1c, Gleason 3+3, PSA 8.2, vol 49.9grams  . Rash    lower right leg  . Wears glasses     Past Surgical History:  Procedure Laterality Date  . CARDIAC CATHETERIZATION N/A 10/07/2016   Procedure: Left Heart Cath and Coronary Angiography;  Surgeon: Troy Sine, MD;  Location: [place] CV [facility];  Service: Cardiovascular;  Laterality: N/A;  . CARDIAC CATHETERIZATION N/A 10/07/2016   Procedure: Coronary Stent Intervention;  Surgeon: Troy Sine, MD;  Location: [place] [facility];  Service: Cardiovascular;  Laterality: N/A;  . CARDIAC CATHETERIZATION N/A 10/11/2016   Procedure: Coronary Stent Intervention;  Surgeon: Peter M Martinique, MD;  Location: [place] [facility];  Service: Cardiovascular;  Laterality: N/A;  . CYSTOSCOPY  02/19/2016   Procedure: CYSTOSCOPY;  Surgeon: Irine Seal, MD;  Location: [facility];  Service: Urology;;  no seeds visualized in bladder  . NO PAST SURGERIES    . RADIOACTIVE SEED IMPLANT N/A 02/19/2016   Procedure: RADIOACTIVE SEED IMPLANT/BRACHYTHERAPY IMPLANT;  Surgeon: Irine Seal, MD;  Location: [facility];  Service: Urology;  Laterality: N/A;  79   seeds implanted    Current Medications: Outpatient Medications Prior to Visit  Medication Sig Dispense Refill  . aspirin EC 81 MG tablet Take 81 mg by mouth daily.    [name] atorvastatin (LIPITOR) 80 MG tablet Take 1 tablet (80 mg total) by mouth daily at 6 PM. 30 tablet 12  . calcium carbonate (TUMS - DOSED IN MG ELEMENTAL CALCIUM) 500 MG chewable tablet Chew 1 tablet by mouth as needed for indigestion or heartburn.    . Multiple Vitamin (MULTIVITAMIN) tablet Take 1 tablet by mouth daily.    . sacubitril-valsartan (ENTRESTO) 97-103 MG Take 1 tablet by mouth 2 (two) times daily. 60 tablet 11  . spironolactone (ALDACTONE) 25 MG tablet Take 0.5 tablets (12.5 mg total) by mouth daily. 15 tablet 12  . tamsulosin (FLOMAX) 0.4 MG CAPS capsule Take 1 capsule (0.4 mg total) by mouth daily. 30 capsule 1  . ticagrelor (BRILINTA) 90 MG TABS tablet Take 1 tablet (90 mg total) by mouth 2 (two) times daily. 60 tablet 12  . carvedilol (COREG) 3.125 MG tablet Take 1 & 1/2 tablet twice a day 90 tablet 6  . furosemide (LASIX) 20 MG tablet Take 0.5 tablets (10 mg total) by mouth daily. 30 tablet 12   No facility-administered medications prior to visit.      Allergies:   Patient has no known allergies.   Social History   Social History  . Marital status: Widowed    Spouse name: N/A  . Number of children: N/A  . Years of education: N/A   Social History Main Topics  . Smoking status: Former Smoker    Packs/day: 1.50    Years: 42.00    Types: Cigarettes    Quit date: 02/11/2014  . Smokeless tobacco: Never Used  . Alcohol use Yes     Comment: occasional  . Drug use: No  . Sexual activity: Not Asked   Other Topics  Concern  . None   Social History Narrative  . None     Family History:  The patient's family history includes Heart attack in his mother.   ROS General: Negative; No fevers, chills, or night sweats;  HEENT: Negative; No changes in vision or hearing, sinus congestion, difficulty swallowing Pulmonary: Negative; No cough, wheezing, shortness of breath, hemoptysis Cardiovascular:  See HPI GI: Negative; No nausea, vomiting, diarrhea, or abdominal pain GU: Negative; No dysuria, hematuria, or difficulty voiding Musculoskeletal: Negative; no myalgias, joint pain, or weakness Hematologic/Oncology: Negative; no easy bruising, bleeding Endocrine: Negative; no heat/cold intolerance; no diabetes Neuro: Negative; no changes in balance, headaches Skin: Negative; No rashes or skin lesions Psychiatric: Negative;  No behavioral problems, depression Sleep: Negative; No snoring, daytime sleepiness, hypersomnolence, bruxism, restless legs, hypnogognic hallucinations, no cataplexy Other comprehensive 14 point system review is negative.   PHYSICAL EXAM:   VS:  BP 128/88   Pulse 66   Ht '6\' 2"'$  (1.88 m)   Wt 197 lb 6.4 oz (89.5 kg)   BMI 25.34 kg/m     Repeat blood pressure by me 110/86 supine, and 118/84 standing.  Wt Readings from Last 3 Encounters:  05/03/17 197 lb 6.4 oz (89.5 kg)  02/18/17 198 lb (89.8 kg)  01/18/17 198 lb (89.8 kg)    General: Alert, oriented, no distress.  Skin: normal turgor, no rashes, warm and dry HEENT: Normocephalic, atraumatic. Pupils equal round and reactive to light; sclera anicteric; extraocular muscles intact;  Nose without nasal septal hypertrophy Mouth/Parynx benign; Mallinpatti scale 3 Neck: No JVD, no carotid bruits; normal carotid upstroke Lungs: clear to ausculatation and percussion; no wheezing or rales Chest wall: without tenderness to palpitation Heart: PMI not displaced, RRR, s1 s2 normal, 1/6 systolic murmur, no diastolic murmur, no rubs, gallops,  thrills, or heaves Abdomen: soft, nontender; no hepatosplenomehaly, BS+; abdominal aorta nontender and not dilated by palpation. Back: no CVA tenderness Pulses 2+ Musculoskeletal: full range of motion, normal strength, no joint deformities Extremities: no clubbing cyanosis or edema, Homan's sign negative  Neurologic: grossly nonfocal; Cranial nerves grossly wnl Psychologic: Normal mood and affect    Studies/Labs Reviewed:   EKG:  EKG is ordered today.  ECG (independently read by me): Normal sinus rhythm at 65 bpm.  Left anterior hemiblock.  QS complex compatible with prior anteroseptal MI.  Normal intervals.  May 2018 ECG (independently read by me): Sinus rhythm at 64 bpm with sinus arrhythmia.  Left anterior hemiblock.  Old anterior MI changes with QRS complex V1 through V4.  Lateral T-wave abnormality.  March 2018 ECG (independently read by me): Normal sinus rhythm at 73 bpm.  Incomplete right bundle branch block.  Left anterior hemiblock, septal infarct.  Recent Labs: BMP Latest Ref Rng & Units 03/28/2017 02/09/2017 01/12/2017  Glucose 65 - 99 mg/dL 105(H) 147(H) 111(H)  BUN 8 - 27 mg/dL '14 11 13  '$ Creatinine 0.76 - 1.27 mg/dL 0.90 0.99 0.82  BUN/Creat Ratio 10 - 24 16 - -  Sodium 134 - 144 mmol/L 139 139 141  Potassium 3.5 - 5.2 mmol/L 4.2 4.0 4.1  Chloride 96 - 106 mmol/L 101 104 105  CO2 20 - 29 mmol/L '22 23 31  '$ Calcium 8.6 - 10.2 mg/dL 9.2 9.1 8.9     Hepatic Function Latest Ref Rng & Units 01/12/2017 02/12/2016  Total Protein 6.1 - 8.1 g/dL 6.1 7.4  Albumin 3.6 - 5.1 g/dL 3.8 4.5  AST 10 - 35 U/L 16 19  ALT 9 - 46 U/L 14 17  Alk Phosphatase 40 - 115 U/L 73 68  Total Bilirubin 0.2 - 1.2 mg/dL 0.7 0.7    CBC Latest Ref Rng & Units 01/12/2017 10/12/2016 10/11/2016  WBC 3.8 - 10.8 K/uL 4.2 5.2 6.0  Hemoglobin 13.2 - 17.1 g/dL 13.4 12.4(L) 13.7  Hematocrit 38.5 - 50.0 % 41.4 37.5(L) 41.0  Platelets 140 - 400 K/uL 229 215 201   Lab Results  Component Value Date   MCV 96.1  01/12/2017   MCV 91.7 10/12/2016   MCV 92.3 10/11/2016   Lab Results  Component Value Date   TSH 2.02 01/12/2017   Lab Results  Component Value Date   HGBA1C 4.9 10/08/2016  BNP No results found for: BNP  ProBNP No results found for: PROBNP   Lipid Panel     Component Value Date/Time   CHOL 108 01/12/2017 0823   TRIG 69 01/12/2017 0823   HDL 41 01/12/2017 0823   CHOLHDL 2.6 01/12/2017 0823   VLDL 14 01/12/2017 0823   LDLCALC 53 01/12/2017 0823    RADIOLOGY: No results found.   Additional studies/ records that were reviewed today include:  I reviewed his hospitalization, cardiac catheterization and intervention reports, subsequent office visit,  Laboratory f/u on entresto and echo Doppler study.     ASSESSMENT:    1. CAD in native artery   2. Status post coronary artery stent placement   3. Cardiomyopathy, ischemic   4. Essential hypertension   5. Hyperlipidemia LDL goal <70      PLAN:  Tyler Chavez is a 66 year old African-American male who suffered an anterior wall ST segment elevation MI secondary to total occlusion of the LAD at the ostium on 10/07/2016.  He underwent successful PCI with stenting and several days later underwent staged PCI to his proximal RCA and his distal posterolateral branch.  He had initial proximal circumflex disease but on repeat evaluation this was improved and is on medical therapy.  Over the last several months, I initiated and gradually titrated  Entresto.  Over the past several months.  He is now on a maximum dose at 97/103, and I had reduced his Lasix to just 10 mg daily.  He has also been on carvedilol at 4.375 mg twice a day, spironolactone 12.5 mg daily, in addition to dual antiplatelet therapy with aspirin and Brilinta.  He denies recurrent chest pain.  He is not had any shocks with his LifeVest.  His most recent follow-up echo Doppler study now shows significant improvement in LV function with an ejection fraction of  40-45%.  On exam, he is clinically stable.  He is euvolemic.  There are no signs of volume overload.  I have recommended that he discontinue furosemide, but he can take this on a when necessary basis if he does note recurrence of leg swelling.  I will further titrate carvedilol to 6.25 mg twice a day.  I also have recommended that he can DC his solo LifeVest.  I reviewed recent laboratory.  Most recent lipid studies are excellent on atorvastatin 80 mg with a total cholesterol 108, LDL 53.  I will see him in 6 months for reevaluation.  Medication Adjustments/Labs and Tests Ordered: Current medicines are reviewed at length with the patient today.  Concerns regarding medicines are outlined above.  Medication changes, Labs and Tests ordered today are listed in the Patient Instructions below. Patient Instructions  Medication Instructions:  Take lasix on a AS NEEDED basis.  INCREASE carvedilol to 6.25 mg two times daily.  Follow-Up: Your physician wants you to follow-up in: 6 MONTHS with Dr. Claiborne Billings. You will receive a reminder letter in the mail two months in advance. If you don't receive a letter, please call our office to schedule the follow-up appointment.   Any Other Special Instructions Will Be Listed Below (If Applicable).  Ok per Dr. Claiborne Billings to discontinue lifevest-call [phone] when you get home to get instructions on returning equipment.  If you need a refill on your cardiac medications before your next appointment, please call your pharmacy.      Signed, Shelva Majestic, MD  05/03/2017 8:58 AM    [facility] [address],  [place], [place]  [address] Phone: [phone]

## 2017-05-03 NOTE — Addendum Note (Signed)
Addended by: Patria Mane A on: 05/03/2017 09:02 AM   Modules accepted: Orders

## 2017-05-03 NOTE — Patient Instructions (Signed)
Medication Instructions:  Take lasix on a AS NEEDED basis.  INCREASE carvedilol to 6.25 mg two times daily.  Follow-Up: Your physician wants you to follow-up in: 6 MONTHS with Dr. Claiborne Billings. You will receive a reminder letter in the mail two months in advance. If you don't receive a letter, please call our office to schedule the follow-up appointment.   Any Other Special Instructions Will Be Listed Below (If Applicable).  Ok per Dr. Claiborne Billings to discontinue lifevest-call [phone] when you get home to get instructions on returning equipment.  If you need a refill on your cardiac medications before your next appointment, please call your pharmacy.

## 2017-05-11 ENCOUNTER — Other Ambulatory Visit: Payer: Self-pay | Admitting: Pharmacist

## 2017-05-11 NOTE — Patient Outreach (Addendum)
[place] [facility]) Care Management  05/11/2017  Tyler Chavez 21-Jun-1951 [id number]   Patient was called to follow up on medication assistance. Patient confirmed receiving PAN Foundation card from a previous phone call and he was being called today to remind him to use the card to get his Entresto  filled.  Also, an Extra Help application was completed on the patient's behalf and was wondering if he had heard anything.     HIPAA identifiers were obtained.  Patient confirmed he was able to use the PAN foundation card to receive his Entresto.  However, he said he has not heard anything from [employer] about the Extra Help program.  Plan:  Follow up with patient during his 05/31/17 previously scheduled call to follow on TROOP Consider closing the pharmacy case.  Elayne Guerin, PharmD, [place] Clinical Pharmacist [phone]

## 2017-05-31 ENCOUNTER — Other Ambulatory Visit: Payer: Self-pay | Admitting: Pharmacist

## 2017-05-31 NOTE — Patient Outreach (Signed)
[place] [facility]) Care Management  05/31/2017  Tyler Chavez 07-06-1951 [id number]   Patient was called to follow up on medication assistance. HIPAA identifiers were obtained. Patient confirmed he was able to use the card from PAN-Foundation to obtain [facility].  [employer] was called on the patient's behalf and it was confirmed the patient has spent $607.00 year to date in out of pocket medication expenses.  An application for [employer]) will be sent to the patient at his home as he has now spent >$600 in medication expenses as is required by the AZ& Me program.  Plan:  Route note to [employer] to send an application to the patient.  Elayne Guerin, PharmD, [place] Clinical Pharmacist [phone]

## 2017-06-03 ENCOUNTER — Other Ambulatory Visit: Payer: Self-pay | Admitting: Pharmacist

## 2017-06-06 NOTE — Patient Outreach (Signed)
  LATE ENTRY FOR 06/03/2016  [place] [facility]) Care Management  06/06/2017  Masson Nalepa 1950/10/17 [id number]   Received a call from [place], Doreene Burke stating the patient expressed concern over the [facility]. Patient was previously approved for the PAN Foundation to receive $800 to be used towards paying for his copays.    Patient's pharmacy was called and the situation was explained. [facility] neglected to "split-bill" the [place] information along with the patient's Medicare Part D information. Once that was done, the patient's copay was $0.00.  The patient was called back and informed he could go and pick up his medication at his convenience. However, he was educated about reminding [facility] each time his prescription is called that it needs to be split billed.    Plan:  Patient will be followed by [place] Technician, Doreene Burke.

## 2017-06-16 ENCOUNTER — Telehealth: Payer: Self-pay | Admitting: *Deleted

## 2017-06-16 NOTE — Telephone Encounter (Signed)
AZ & ME (Brilinta) patient assistance forms received from [facility].   Provider portion completed and signed by Dr. Claiborne Billings.   Faxed to [phone].

## 2017-06-20 ENCOUNTER — Other Ambulatory Visit: Payer: Self-pay | Admitting: Pharmacy Technician

## 2017-06-20 NOTE — Patient Outreach (Signed)
[place] [facility]) Care Management  06/20/2017  Juquan Reznick 02/04/1951 [id number]  I contacted Mr. Skirvin today in reference to the AZ&ME patient assistance application that I mailed to him recently. The patient confirmed that he received the application and mailed it back to me last week. As of today I have not received the application but I have the completed prescriber portion and will be able to fax the application once I receive the patient's portion.  Doreene Burke, [place] [phone]

## 2017-06-21 ENCOUNTER — Other Ambulatory Visit: Payer: Self-pay | Admitting: Pharmacist

## 2017-06-21 NOTE — Patient Outreach (Signed)
[name]) Care Management  06/21/2017  Tyler Chavez 02-Jun-1951 [id number]   Patient was called to follow up on [facility] billing. HIPAA identifiers were obtained. Patient confirmed he picked [facility] at [facility] charge. He also reported speaking to [place], Doreene Burke yesterday. Patient said he has mailed back all forms.  Plan: Patient will be followed by Doreene Burke for patient assistance.   Elayne Guerin, PharmD, [place] Clinical Pharmacist [phone]

## 2017-07-04 ENCOUNTER — Other Ambulatory Visit: Payer: Self-pay | Admitting: Pharmacy Technician

## 2017-07-04 NOTE — Patient Outreach (Signed)
[place] [facility]) Care Management  06/30/2017  Tyler Chavez 01/20/1951 [id number]   I contacted AZ&ME patient assistance program to check on the status of an application that was submitted for Brilinta. The patient is currently approved for a 30 day supply which is being shipped to his residence. In order for AZ&ME to ship additional medication they need the patient to apply for LIS and send a denial letter. I contacted Denyse Amass, Rph who had previously tried to submit a LIS application for [name] and she requested for me to contact the patient to see if he received the letter. I spoke with the patient and he does have the denial and will either fax or mail it to me. Once I receive that I will be able to fax it to AZ&ME to get him medication for the remainder of the year.  Doreene Burke, [place] [phone]

## 2017-07-15 ENCOUNTER — Other Ambulatory Visit: Payer: Self-pay | Admitting: Pharmacy Technician

## 2017-07-15 NOTE — Patient Outreach (Signed)
[place] [facility]) Care Management  07/15/2017  Tyler Chavez March 16, 1951 [id number]  Contacted AZ&ME in reference to previous call on 47/02/5464 about application that was submitted for Brilinta. They had requested a copy of the patient's LIS denial letter in order to approve him for the rest of 2018. I faxed the information on 07/05/2017 and called to follow-up today.    The patient has been approved for the rest of the year and I placed his refill while on the phone with AZ&ME. The medication should arrive at the patient's home in 7-10 business days. I contacted the patient to make him aware of the status of his application and to inform him when he should receive it.  PLAN:  I will contact the patient in the next 2 weeks to ensure that he has received his medication.  Doreene Burke, [place] [phone]

## 2017-07-25 DIAGNOSIS — Z23 Encounter for immunization: Secondary | ICD-10-CM | POA: Diagnosis not present

## 2017-07-25 DIAGNOSIS — I2510 Atherosclerotic heart disease of native coronary artery without angina pectoris: Secondary | ICD-10-CM | POA: Diagnosis not present

## 2017-07-25 DIAGNOSIS — Z1389 Encounter for screening for other disorder: Secondary | ICD-10-CM | POA: Diagnosis not present

## 2017-07-25 DIAGNOSIS — Z6824 Body mass index (BMI) 24.0-24.9, adult: Secondary | ICD-10-CM | POA: Diagnosis not present

## 2017-07-25 DIAGNOSIS — Z0000 Encounter for general adult medical examination without abnormal findings: Secondary | ICD-10-CM | POA: Diagnosis not present

## 2017-07-25 DIAGNOSIS — R7309 Other abnormal glucose: Secondary | ICD-10-CM | POA: Diagnosis not present

## 2017-07-26 ENCOUNTER — Other Ambulatory Visit: Payer: Self-pay | Admitting: Pharmacy Technician

## 2017-07-26 NOTE — Patient Outreach (Signed)
[facility] [facility]) Care Management  07/26/2017  Tyler Chavez April 26, 1951 [id number]  I returned call to [name] in reference to paperwork that he received from [employer] HIPAA identifier's verified and verbal consent received  After contacting Alwyn Ren, Rph I was informed that the patient may have received paperwork because he is qualified for Medicaid. If that's the case she can refer him to a [facility] social worker to assist him with the process. I informed the patient and also told him he can call the number that is included in the letter. If the patient needs any further assistance he will contact me.  Also the patient has received the rest of his Brilinta that he was approved for.   Doreene Burke, [place] [phone]

## 2017-07-28 ENCOUNTER — Encounter: Payer: Self-pay | Admitting: Pharmacist

## 2017-08-04 ENCOUNTER — Other Ambulatory Visit: Payer: Self-pay | Admitting: Pharmacist

## 2017-08-04 NOTE — Patient Outreach (Addendum)
[facility] Care Management  08/04/2017  Tyler Chavez 02/03/1951 [id number]   Patient called to inquire about the case closure letter he received and left a message on my voicemail.  Patient was called back but he did not answer and his voicemail was not set up so a message could not be left. Patient said on his message on my box that he was confused about the letter.  Investigation of the letter revealed the patient had two letters created on the same day.  One letter said the reason for case closure was that the patient chose to discontinue services--which was incorrect.  The other letter said the reason for case closure was that the goals were met--patient received his medications.(that is the correct reason).   Plan:  Call patient back with the next week if I do not hear from him first.   Katina J. Boyd, PharmD, BCACP [facility] Clinical Pharmacist [phone]   

## 2017-08-11 ENCOUNTER — Ambulatory Visit: Payer: Self-pay | Admitting: Pharmacist

## 2017-09-24 ENCOUNTER — Other Ambulatory Visit: Payer: Self-pay | Admitting: Cardiovascular Disease

## 2017-10-03 DIAGNOSIS — Z8546 Personal history of malignant neoplasm of prostate: Secondary | ICD-10-CM | POA: Diagnosis not present

## 2017-10-06 ENCOUNTER — Other Ambulatory Visit: Payer: Self-pay | Admitting: Pharmacist

## 2017-10-07 NOTE — Patient Outreach (Signed)
[facility]) Care Management  [place]   10/07/2017  Tyler Chavez Jul 08, 1951 [id number]  Subjective: Patient called in to inquire about patient assistance with [name] and Brillinta for 2019.  HIPAA identifiers were obtained. Patient is a 67 year old male with CAD post stent placement, history of STEMI, CHF and history of prostate cancer.  Patient manages his medications on his own.  Objective:   Encounter Medications: Outpatient Encounter Medications as of 10/06/2017  Medication Sig  . aspirin EC 81 MG tablet Take 81 mg by mouth daily.  [name] atorvastatin (LIPITOR) 80 MG tablet Take 1 tablet (80 mg total) by mouth daily at 6 PM.  . calcium carbonate (TUMS - DOSED IN MG ELEMENTAL CALCIUM) 500 MG chewable tablet Chew 1 tablet by mouth as needed for indigestion or heartburn.  . carvedilol (COREG) 6.25 MG tablet Take 1 tablet (6.25 mg total) by mouth 2 (two) times daily with a meal.  . furosemide (LASIX) 20 MG tablet Take 0.5 tablets (10 mg total) by mouth as needed.  . Multiple Vitamin (MULTIVITAMIN) tablet Take 1 tablet by mouth daily.  . sacubitril-valsartan (ENTRESTO) 97-103 MG Take 1 tablet by mouth 2 (two) times daily.  [name] spironolactone (ALDACTONE) 25 MG tablet Take 0.5 tablets (12.5 mg total) by mouth daily.  . tamsulosin (FLOMAX) 0.4 MG CAPS capsule Take 1 capsule (0.4 mg total) by mouth daily.  . ticagrelor (BRILINTA) 90 MG TABS tablet Take 1 tablet (90 mg total) by mouth 2 (two) times daily.   No facility-administered encounter medications on file as of 10/06/2017.     Functional Status: In your present state of health, do you have any difficulty performing the following activities: 10/07/2016  Hearing? N  Vision? N  Comment pt uses glasses, family will bring in  Difficulty concentrating or making decisions? N  Walking or climbing stairs? N  Dressing or bathing? N  Doing errands, shopping? N  Some recent data might be hidden    Fall/Depression  Screening: Fall Risk  03/18/2016  Falls in the past year? No   PHQ 2/9 Scores 03/18/2016  PHQ - 2 Score 0      Assessment: Patient's medications were reviewed via telephone.     Drugs sorted by system:  Neurologic/Psychologic:  Cardiovascular: Aspirin Atorvastatin Carvedilol Furosemide Entresto Spironolactone Brilinta  Vitamins/Minerals: Tums (Calcium Carbonate) Multivitamin  Urology Tamsulosin  Medication Assistance Foundation:  -patient is over income for The Extra Help Program. An application was completed on his behalf last year and he was denied. Patient's income was assessed this year and he is over income for this benefit year as well.  -The PAN foundation was called. Patient was approved for $1000 but will have to renew March 20, 2018 for the rest of the year.    -This was explained to the patient and he communicated understanding  -An application will be sent to the patient's home for [place].  However, Pattricia Boss is offered through [place] patient assistance program and that program requires patients to spend 3% of their income on medication expenses to be eligible.   Plan: Route letter to Doreene Burke, CPhT requesting an application to be sent to the patient for [place] note to provider.  Follow up on Entresto refills and application in 55-73 days.  Elayne Guerin, PharmD, [place] Clinical Pharmacist [phone]

## 2017-10-10 DIAGNOSIS — N5201 Erectile dysfunction due to arterial insufficiency: Secondary | ICD-10-CM | POA: Diagnosis not present

## 2017-10-10 DIAGNOSIS — N401 Enlarged prostate with lower urinary tract symptoms: Secondary | ICD-10-CM | POA: Diagnosis not present

## 2017-10-10 DIAGNOSIS — Z8546 Personal history of malignant neoplasm of prostate: Secondary | ICD-10-CM | POA: Diagnosis not present

## 2017-10-10 DIAGNOSIS — R3914 Feeling of incomplete bladder emptying: Secondary | ICD-10-CM | POA: Diagnosis not present

## 2017-10-19 ENCOUNTER — Other Ambulatory Visit: Payer: Self-pay | Admitting: Pharmacy Technician

## 2017-10-19 ENCOUNTER — Ambulatory Visit: Payer: Self-pay | Admitting: Pharmacist

## 2017-10-19 NOTE — Patient Outreach (Signed)
[place] [facility]) Care Management  10/19/2017  Tyler Chavez November 22, 1950 [id number]  Successful patient outreach call in reference to AZ&ME application for Brilinta that was mailed to the patient 10/10/2017. HIPAA identifiers verified and verbal consent received. The patient states he is completing the application today and will mail it as soon as it's completed.  Doreene Burke, [name] [phone]

## 2017-10-25 ENCOUNTER — Other Ambulatory Visit: Payer: Self-pay

## 2017-10-25 MED ORDER — ATORVASTATIN CALCIUM 80 MG PO TABS
80.0000 mg | ORAL_TABLET | Freq: Every day | ORAL | 0 refills | Status: DC
Start: 2017-10-25 — End: 2017-11-26

## 2017-10-25 MED ORDER — SPIRONOLACTONE 25 MG PO TABS: 12.5000 mg | ORAL_TABLET | Freq: Every day | ORAL | 0 refills | Status: DC

## 2017-10-26 ENCOUNTER — Other Ambulatory Visit: Payer: Self-pay | Admitting: Pharmacy Technician

## 2017-10-26 NOTE — Patient Outreach (Signed)
[facility] [facility]) Care Management  10/26/2017  Tyler Chavez 1951-09-22 [id number]  Successful patient outreach call in reference to AZ&ME patient assistance application for Brilinta. HIPAA identifiers verified and verbal consent received. I received the patient's completed application today and noticed that his out of pocket spend for medication's was from 2018. The patient will have to spend 3% of his income in calendar year 2019 to get approved. I informed him of the dollar amount that he would need to spend and he will contact [place] or I once he has spent the required amount.  Doreene Burke, [place] [phone]

## 2017-10-27 ENCOUNTER — Other Ambulatory Visit: Payer: Self-pay | Admitting: Cardiovascular Disease

## 2017-10-31 ENCOUNTER — Telehealth: Payer: Self-pay | Admitting: *Deleted

## 2017-10-31 MED ORDER — CARVEDILOL 6.25 MG PO TABS: 6.2500 mg | ORAL_TABLET | Freq: Two times a day (BID) | ORAL | 1 refills | Status: DC

## 2017-10-31 MED ORDER — TICAGRELOR 90 MG PO TABS: 90.0000 mg | ORAL_TABLET | Freq: Two times a day (BID) | ORAL | 1 refills | Status: DC

## 2017-10-31 NOTE — Telephone Encounter (Signed)
Patient came in as a walk in to request refills on Brilinta and Carvedilol. They have been [facility] into [facility] [address] per his request.

## 2017-11-22 ENCOUNTER — Other Ambulatory Visit: Payer: Self-pay

## 2017-11-22 MED ORDER — SPIRONOLACTONE 25 MG PO TABS: 12.5000 mg | ORAL_TABLET | Freq: Every day | ORAL | 0 refills | Status: DC

## 2017-11-26 ENCOUNTER — Other Ambulatory Visit: Payer: Self-pay | Admitting: Cardiovascular Disease

## 2017-11-29 NOTE — Telephone Encounter (Signed)
Rx(s) sent to pharmacy electronically.  

## 2017-12-08 ENCOUNTER — Encounter: Payer: Self-pay | Admitting: Cardiovascular Disease

## 2017-12-08 ENCOUNTER — Ambulatory Visit: Payer: PPO | Admitting: Cardiovascular Disease

## 2017-12-08 VITALS — BP 146/86 | HR 71 | Ht 74.0 in | Wt 197.8 lb

## 2017-12-08 DIAGNOSIS — I10 Essential (primary) hypertension: Secondary | ICD-10-CM

## 2017-12-08 DIAGNOSIS — E785 Hyperlipidemia, unspecified: Secondary | ICD-10-CM

## 2017-12-08 DIAGNOSIS — I255 Ischemic cardiomyopathy: Secondary | ICD-10-CM | POA: Diagnosis not present

## 2017-12-08 DIAGNOSIS — I25119 Atherosclerotic heart disease of native coronary artery with unspecified angina pectoris: Secondary | ICD-10-CM | POA: Diagnosis not present

## 2017-12-08 NOTE — Patient Instructions (Signed)

## 2017-12-08 NOTE — Progress Notes (Signed)
Cardiology Office Note    Date:  12/09/2017   ID:  Tyler Chavez, DOB 1950/12/28, MRN [id number]  PCP:  Redmond School, MD  Cardiologist:  Shelva Majestic, MD   No chief complaint on file.   History of Present Illness:  Tyler Chavez is a 67 y.o. male who presents for a 7 -month follow-up cardiology evaluation.  Mr. Tyler Chavez has a history of hypertension valve.  He developed new onset chest pain on 10/06/2016 at approximately 11 PM.  Chest pain persisted throughout the night and he presented to [facility].  His ECG showed findings compatible with an acute anterior STEMI.  He was transferred urgently to [facility] hospital right performed emergent cardiac catheterization which revealed total occlusion of the LAD at the ostium, 75% proximal circumflex stenosis, 95% proximal RCA stenosis, and 95% PLA stenoses.  He underwent successful PCI to his totally occluded LAD with ultimate insertion of a 4.015 mm Resolute onyx DES stent postdilated to 4.1 mm with the percent occlusion being reduced to 0% and restoration of TIMI-3 flow.  He initially had hypotension and cardiogenic shock.and required dopamine and fluid resuscitation.  7 days later he underwent staged PCI to his PLA vessel and proximal RCA.  His initial echo showed an EF 25-30%.  Several days later showed his EF increased to 35%.   When I saw him for initial evaluation, I recommended discontinuance of lisinopril and switched him to entresto 24/26.  He had tolerated that well.  When I's last saw him 2 months ago he was doing well and entresto was further titrated to 49/51 mg twice a day.  He has continued to be on spironolactone at 12.5 g daily, furosemide 20 mg daily, and carvedilol 1-1/2 pills of a 3.125 mg tablet twice a day.  He continues to wear a LifeVest.  He is unaware of any discharges.  His breathing is better.  He denies shortness of breath.  He denies edema.    When I last saw him, I further titrated entresto to 97/103,  and decreased his Lasix.  Over the last several months, he denies any awareness of palpitations.  He denies any recurrence of leg swelling.  Laboratory revealed a BUN of 14 and creatinine of 0.9.  He underwent a follow-up echo Doppler study on 04/05/2017.  This now showed improvement in LV function with an EF of 40-45% with previous anterior anteroseptal and apical hypokinesis.  There was grade 1 diastolic dysfunction. With his improvement in LV function, his LifeVest was discontinued at his last office in August 2018.  Since I last saw him, he has felt well.  He is tolerating optimal Entresto dosing. Tyler Chavez  He denies any palpitations.  He is working a part-time job 4 days a week for 6 hours at a time  He is doing light work.  He denies PND, orthopnea.  He presents for reevaluation.  Past Medical History:  Diagnosis Date  . Coronary artery disease    a. 09/2016: Anterior STEMI with 100% Ost LAD stenosis, 95% Post Atrio stenosis, 90% Prox RCA stenosis, and 75% Ost to Prox Cx stenosis. DES to LAD. Staged DES to Post Atrio and DES to RCA performed later in admission.  . ED (erectile dysfunction)   . Frequency of urination   . GERD (gastroesophageal reflux disease)   . Hyperlipidemia   . Hypertension   . Ischemic cardiomyopathy    a. 09/2016: echo showing EF of 30-35% with Grade 2 DD  . Myocardial infarction (  HCC)   . Pneumonia   . Prostate cancer [facility]) urologist- dr wrenn/  oncologist-  dr Tammi Klippel   Stage T1c, Gleason 3+3, PSA 8.2, vol 49.9grams  . Rash    lower right leg  . Wears glasses     Past Surgical History:  Procedure Laterality Date  . CARDIAC CATHETERIZATION N/A 10/07/2016   Procedure: Left Heart Cath and Coronary Angiography;  Surgeon: Troy Sine, MD;  Location: [place] [facility];  Service: Cardiovascular;  Laterality: N/A;  . CARDIAC CATHETERIZATION N/A 10/07/2016   Procedure: Coronary Stent Intervention;  Surgeon: Troy Sine, MD;  Location: [place] [facility];  Service:  Cardiovascular;  Laterality: N/A;  . CARDIAC CATHETERIZATION N/A 10/11/2016   Procedure: Coronary Stent Intervention;  Surgeon: Peter M Martinique, MD;  Location: [place] [facility];  Service: Cardiovascular;  Laterality: N/A;  . CYSTOSCOPY  02/19/2016   Procedure: CYSTOSCOPY;  Surgeon: Irine Seal, MD;  Location: [facility];  Service: Urology;;  no seeds visualized in bladder  . NO PAST SURGERIES    . RADIOACTIVE SEED IMPLANT N/A 02/19/2016   Procedure: RADIOACTIVE SEED IMPLANT/BRACHYTHERAPY IMPLANT;  Surgeon: Irine Seal, MD;  Location: [facility];  Service: Urology;  Laterality: N/A;  79   seeds implanted    Current Medications: Outpatient Medications Prior to Visit  Medication Sig Dispense Refill  . aspirin EC 81 MG tablet Take 81 mg by mouth daily.    Tyler Chavez atorvastatin (LIPITOR) 80 MG tablet TAKE 1 TABLET BY MOUTH DAILY AT 6 PM. PLEASE KEEP UPCOMING APPOINTMENT FOR CONTINUATION OF REFILLS 30 tablet 6  . calcium carbonate (TUMS - DOSED IN MG ELEMENTAL CALCIUM) 500 MG chewable tablet Chew 1 tablet by mouth as needed for indigestion or heartburn.    . carvedilol (COREG) 6.25 MG tablet Take 1 tablet (6.25 mg total) by mouth 2 (two) times daily with a meal. 60 tablet 1  . furosemide (LASIX) 20 MG tablet TAKE 1 TABLET (20 MG TOTAL) BY MOUTH DAILY. 30 tablet 1  . Multiple Vitamin (MULTIVITAMIN) tablet Take 1 tablet by mouth daily.    . sacubitril-valsartan (ENTRESTO) 97-103 MG Take 1 tablet by mouth 2 (two) times daily. 60 tablet 11  . spironolactone (ALDACTONE) 25 MG tablet Take 0.5 tablets (12.5 mg total) by mouth daily. KEEP UPCOMING APPOINTMENT FOR CONTINUATION OF REFILLS ON ALL MEDICATIONS 15 tablet 0  . tamsulosin (FLOMAX) 0.4 MG CAPS capsule Take 1 capsule (0.4 mg total) by mouth daily. 30 capsule 1  . ticagrelor (BRILINTA) 90 MG TABS tablet Take 1 tablet (90 mg total) by mouth 2 (two) times daily. 60 tablet 1   No facility-administered medications prior to visit.       Allergies:   Patient has no known allergies.   Social History   Socioeconomic History  . Marital status: Widowed    Spouse name: None  . Number of children: None  . Years of education: None  . Highest education level: None  Social Needs  . Financial resource strain: None  . Food insecurity - worry: None  . Food insecurity - inability: None  . Transportation needs - medical: None  . Transportation needs - non-medical: None  Occupational History  . None  Tobacco Use  . Smoking status: Former Smoker    Packs/day: 1.50    Years: 42.00    Pack years: 63.00    Types: Cigarettes    Last attempt to quit: 02/11/2014    Years since quitting: 3.8  .  Smokeless tobacco: Never Used  Substance and Sexual Activity  . Alcohol use: Yes    Comment: occasional  . Drug use: No  . Sexual activity: None  Other Topics Concern  . None  Social History Narrative  . None     Family History:  The patient's family history includes Heart attack in his mother.   ROS General: Negative; No fevers, chills, or night sweats;  HEENT: Negative; No changes in vision or hearing, sinus congestion, difficulty swallowing Pulmonary: Negative; No cough, wheezing, shortness of breath, hemoptysis Cardiovascular:  See HPI GI: Negative; No nausea, vomiting, diarrhea, or abdominal pain GU: Negative; No dysuria, hematuria, or difficulty voiding Musculoskeletal: Negative; no myalgias, joint pain, or weakness Hematologic/Oncology: Negative; no easy bruising, bleeding Endocrine: Negative; no heat/cold intolerance; no diabetes Neuro: Negative; no changes in balance, headaches Skin: Negative; No rashes or skin lesions Psychiatric: Negative; No behavioral problems, depression Sleep: Negative; No snoring, daytime sleepiness, hypersomnolence, bruxism, restless legs, hypnogognic hallucinations, no cataplexy Other comprehensive 14 point system review is negative.   PHYSICAL EXAM:   VS:  BP (!) 146/86   Pulse 71    Ht _0  (1.88 m)   Wt 197 lb 12.8 oz (89.7 kg)   BMI 25.40 kg/m     Repeat blood pressure by me was 106/6 supine and 106/82, standing  Wt Readings from Last 3 Encounters:  12/08/17 197 lb 12.8 oz (89.7 kg)  05/03/17 197 lb 6.4 oz (89.5 kg)  02/18/17 198 lb (89.8 kg)    General: Alert, oriented, no distress.  Skin: normal turgor, no rashes, warm and dry HEENT: Normocephalic, atraumatic. Pupils equal round and reactive to light; sclera anicteric; extraocular muscles intact;  Nose without nasal septal hypertrophy Mouth/Parynx benign; Mallinpatti scale 3 Neck: No JVD, no carotid bruits; normal carotid upstroke Lungs: clear to ausculatation and percussion; no wheezing or rales Chest wall: without tenderness to palpitation Heart: PMI not displaced, RRR, s1 s2 normal, 1/6 systolic murmur, no diastolic murmur, no rubs, gallops, thrills, or heaves Abdomen: soft, nontender; no hepatosplenomehaly, BS+; abdominal aorta nontender and not dilated by palpation. Back: no CVA tenderness Pulses 2+ Musculoskeletal: full range of motion, normal strength, no joint deformities Extremities: no clubbing cyanosis or edema, Homan's sign negative  Neurologic: grossly nonfocal; Cranial nerves grossly wnl Psychologic: Normal mood and affect    Studies/Labs Reviewed:   EKG:  EKG is ordered today. ECG (independently read by me): normal sinus rhythm at 71 bpm.  Incomplete right bundle branch block.  Left anterior hemiblock.  Poor R wave progression V1 through V5 with QS complex.   August 2018 ECG (independently read by me): Normal sinus rhythm at 65 bpm.  Left anterior hemiblock.  QS complex compatible with prior anteroseptal MI.  Normal intervals.  May 2018 ECG (independently read by me): Sinus rhythm at 64 bpm with sinus arrhythmia.  Left anterior hemiblock.  Old anterior MI changes with QRS complex V1 through V4.  Lateral T-wave abnormality.  March 2018 ECG (independently read by me): Normal sinus rhythm  at 73 bpm.  Incomplete right bundle branch block.  Left anterior hemiblock, septal infarct.  Recent Labs: BMP Latest Ref Rng & Units 03/28/2017 02/09/2017 01/12/2017  Glucose 65 - 99 mg/dL 105(H) 147(H) 111(H)  BUN 8 - 27 mg/dL _1 Creatinine 0.76 - 1.27 mg/dL 0.90 0.99 0.82  BUN/Creat Ratio 10 - 24 16 - -  Sodium 134 - 144 mmol/L 139 139 141  Potassium 3.5 - 5.2 mmol/L 4.2 4.0  4.1  Chloride 96 - 106 mmol/L 101 104 105  CO2 20 - 29 mmol/L _0 Calcium 8.6 - 10.2 mg/dL 9.2 9.1 8.9     Hepatic Function Latest Ref Rng & Units 01/12/2017 02/12/2016  Total Protein 6.1 - 8.1 g/dL 6.1 7.4  Albumin 3.6 - 5.1 g/dL 3.8 4.5  AST 10 - 35 U/L 16 19  ALT 9 - 46 U/L 14 17  Alk Phosphatase 40 - 115 U/L 73 68  Total Bilirubin 0.2 - 1.2 mg/dL 0.7 0.7    CBC Latest Ref Rng & Units 01/12/2017 10/12/2016 10/11/2016  WBC 3.8 - 10.8 K/uL 4.2 5.2 6.0  Hemoglobin 13.2 - 17.1 g/dL 13.4 12.4(L) 13.7  Hematocrit 38.5 - 50.0 % 41.4 37.5(L) 41.0  Platelets 140 - 400 K/uL 229 215 201   Lab Results  Component Value Date   MCV 96.1 01/12/2017   MCV 91.7 10/12/2016   MCV 92.3 10/11/2016   Lab Results  Component Value Date   TSH 2.02 01/12/2017   Lab Results  Component Value Date   HGBA1C 4.9 10/08/2016    BNP No results found for: BNP  ProBNP No results found for: PROBNP   Lipid Panel     Component Value Date/Time   CHOL 108 01/12/2017 0823   TRIG 69 01/12/2017 0823   HDL 41 01/12/2017 0823   CHOLHDL 2.6 01/12/2017 0823   VLDL 14 01/12/2017 0823   LDLCALC 53 01/12/2017 0823    RADIOLOGY: No results found.   Additional studies/ records that were reviewed today include:  I reviewed his hospitalization, cardiac catheterization and intervention reports, subsequent office visit,  Laboratory f/u on entresto and echo Doppler study.     ASSESSMENT:    1. Coronary artery disease involving native coronary artery of native heart with angina pectoris ([place])   2. Essential hypertension    3. Hyperlipidemia LDL goal <70   4. Cardiomyopathy, ischemic      PLAN:  Mr. Kiyoto Slomski is a 67 year old African-American male who suffered an anterior wall ST segment elevation MI secondary to total occlusion of the LAD at the ostium on 10/07/2016.  He underwent successful PCI with stenting and several days later underwent staged PCI to his proximal RCA and his distal posterolateral branch.  He had initial proximal circumflex disease but on repeat evaluation this was improved and is on medical therapy. As an outpatient, I have gradually initiated Entr  His LV fuproved to 40-45% and LifeVest was discontinued. When I last saw no signs of volume overload and I discontinued furosemide.  His blood pressure today is stable on carvedilol 6.25 mg twice a day, spironolactone 12.5 mg, in addition to his Entresto 97/103  He continues to be on atorvastatin 80 mg.  Follow-up LDL in October 2018 was 60, triglycerides were 61, total cholesterol 120, and HDL48. Tyler Chavez  He continues to be on dual antiplatelet therapy and there is no bleeding.Tyler Chavez  His ECG is stable on carvedilol. .  I will see him in 6 months for reevaluation.    Medication Adjustments/Labs and Tests Ordered: Current medicines are reviewed at length with the patient today.  Concerns regarding medicines are outlined above.  Medication changes, Labs and Tests ordered today are listed in the Patient Instructions below. Patient Instructions  Medication Instructions:  Your physician recommends that you continue on your current medications as directed. Please refer to the Current Medication list given to you today.  Follow-Up: Your physician wants you to follow-up in:  6 months with Dr. Claiborne Billings. You will receive a reminder letter in the mail two months in advance. If you don't receive a letter, please call our office to schedule the follow-up appointment.   Any Other Special Instructions Will Be Listed Below (If Applicable).     If you need a refill on  your cardiac medications before your next appointment, please call your pharmacy.      Signed, Shelva Majestic, MD  12/09/2017 4:34 PM    [facility] [address] [place], [place],   [address] Phone: [phone]

## 2017-12-09 ENCOUNTER — Encounter: Payer: Self-pay | Admitting: Cardiovascular Disease

## 2017-12-29 ENCOUNTER — Other Ambulatory Visit: Payer: Self-pay | Admitting: Cardiovascular Disease

## 2018-01-10 ENCOUNTER — Encounter: Payer: Self-pay | Admitting: Pharmacist

## 2018-01-26 ENCOUNTER — Other Ambulatory Visit: Payer: Self-pay | Admitting: Cardiovascular Disease

## 2018-01-26 NOTE — Telephone Encounter (Signed)
REFILL 

## 2018-02-24 ENCOUNTER — Other Ambulatory Visit: Payer: Self-pay | Admitting: Cardiovascular Disease

## 2018-02-24 NOTE — Telephone Encounter (Signed)
Rx sent to pharmacy   

## 2018-02-28 ENCOUNTER — Other Ambulatory Visit: Payer: Self-pay | Admitting: Pharmacist

## 2018-02-28 ENCOUNTER — Other Ambulatory Visit: Payer: Self-pay | Admitting: Pharmacy Technician

## 2018-02-28 NOTE — Patient Outreach (Signed)
[place] [facility]) Care Management  02/28/2018  Tyler Chavez [birth year] [id number]   Received verification from [place] that patient has met required OOP spending for [place] patient assistance. I already have patient portion of application in hand and faxed out provider portion of application to Dr. Claiborne Billings.  Once provider portion is received, will submit completed application to company.  Maud Deed [place], [place] Management [phone]

## 2018-02-28 NOTE — Patient Outreach (Addendum)
[facility]) Care Management  02/28/2018  Tyler Chavez 08/17/1951 [id number]  Received a call from Tyler Chavez stating that he is now in the donut hole. HIPAA identifiers were obtained. [employer] has been working with [name] to obtain Brilinta and [name].  The Brilinta program ([place] &[place]) requires patient's to spend at least 3% of their income on medication expenses. According to [employer], patient has spent $689.05 which more than satisfies this requirements if the income the patient reported is true.   Patient will be sent an application for Brilinta via mail.  Plan: Route note to [facility] Certified Pharmacy Technician, [name] to have her send the patient an application. Get TROOP document from [employer] Follow up on the application in 2 weeks.  Elayne Guerin, PharmD, [place] Clinical Pharmacist [phone]

## 2018-03-09 ENCOUNTER — Other Ambulatory Visit: Payer: Self-pay | Admitting: Pharmacy Technician

## 2018-03-09 NOTE — Patient Outreach (Signed)
[place] [facility]) Care Management  03/09/2018  Tyler Chavez [birth year] [id number]   Received provider portion of [place] patient assistance application for Brilinta. Faxed completed application and required documents into company.  Will follow up on application status in the next 5-7 business days.  Maud Deed [place], [place] Management [phone]

## 2018-03-14 ENCOUNTER — Ambulatory Visit: Payer: Self-pay | Admitting: Pharmacist

## 2018-03-22 ENCOUNTER — Other Ambulatory Visit: Payer: Self-pay | Admitting: Pharmacy Technician

## 2018-03-22 NOTE — Patient Outreach (Signed)
[name]) Care Management  03/22/2018  Tyler Chavez [birth year] [id number]   Follow up call to [name] to check the status of patient application for Brilinta. [name] stated that application had been received however the prescription portion would need to be faxed from provider's office.  Contacted [name] at [name] office and she stated that if I faxed the form to her she would fax it into the company.  Will follow up with [name] in 2-3 business days.  Maud Deed [place], [place] Management [phone]

## 2018-03-24 ENCOUNTER — Other Ambulatory Visit: Payer: Self-pay | Admitting: Pharmacy Technician

## 2018-03-24 NOTE — Patient Outreach (Signed)
[facility]) Care Management  03/24/2018  Tyler Chavez August 23, 1951 [id number]   Follow up call to AZ&ME to check status of patients application for Brilinta. Caren Griffins confirmed that patient has been approved as of 03/24/18 until 09/26/18 and should receive medication in the next 3-5 business days.  Will follow up with patient in 7-10 business days to confirm delivery of medication.  Maud Deed [place], [place] Management [phone]

## 2018-03-31 ENCOUNTER — Other Ambulatory Visit: Payer: Self-pay | Admitting: Pharmacy Technician

## 2018-03-31 NOTE — Patient Outreach (Signed)
[facility]) Care Management  03/31/2018  Tyler Chavez 06/10/1951 [id number]   Successful outreach call, HIPAA identifiers verified. [name] confirmed that he received his Brilinta from AZ&ME on Tuesday. Discussed with patient how to obtain refills.  Will route note to [place] for case closure.  Maud Deed [place], [place] Management [phone]

## 2018-04-03 ENCOUNTER — Other Ambulatory Visit: Payer: Self-pay | Admitting: Pharmacy Technician

## 2018-04-03 ENCOUNTER — Telehealth: Payer: Self-pay | Admitting: Pharmacist

## 2018-04-03 NOTE — Patient Outreach (Signed)
[place] [facility]) Care Management  04/03/2018  Tyler Chavez 1951/08/29 [id number]   Patient was called regarding medication assistance. HIPAA identifiers were obtained. On October 06, 2017, patient was approved through the PAN foundation for $1000.  The representative said the patient could be re-approved on 03/20/18.  Unfortunately, the PAN foundation is already out of funds for this year allocate for heart failure.  In an effort to look ahead, an application for [name] will be mailed to the patient for completion and to his provider.  Plan: Route note to [name], CPhT to send an application for Entresto to the patient. Follow up on the application in 75-91 days.   Elayne Guerin, PharmD, [place] Clinical Pharmacist [phone]

## 2018-04-03 NOTE — Patient Outreach (Signed)
[place] [facility]) Care Management  04/03/2018  Jadian Karman [birth year] [id number]   Received [employer] patient assistance referral from [place] for [place]. Prepared patient portion to be mailed and faxed provider portion to Dr. Claiborne Billings.  Pharmacist will follow up with patient in 10-14 days to confirm application has been received.  Maud Deed [place], [place] Management [phone]

## 2018-04-05 DIAGNOSIS — Z8546 Personal history of malignant neoplasm of prostate: Secondary | ICD-10-CM | POA: Diagnosis not present

## 2018-04-10 DIAGNOSIS — Z0000 Encounter for general adult medical examination without abnormal findings: Secondary | ICD-10-CM | POA: Diagnosis not present

## 2018-04-10 DIAGNOSIS — I10 Essential (primary) hypertension: Secondary | ICD-10-CM | POA: Diagnosis not present

## 2018-04-10 DIAGNOSIS — E782 Mixed hyperlipidemia: Secondary | ICD-10-CM | POA: Diagnosis not present

## 2018-04-10 DIAGNOSIS — R7309 Other abnormal glucose: Secondary | ICD-10-CM | POA: Diagnosis not present

## 2018-04-10 DIAGNOSIS — Z1389 Encounter for screening for other disorder: Secondary | ICD-10-CM | POA: Diagnosis not present

## 2018-04-10 DIAGNOSIS — Z0001 Encounter for general adult medical examination with abnormal findings: Secondary | ICD-10-CM | POA: Diagnosis not present

## 2018-04-10 DIAGNOSIS — Z6824 Body mass index (BMI) 24.0-24.9, adult: Secondary | ICD-10-CM | POA: Diagnosis not present

## 2018-04-10 DIAGNOSIS — I2510 Atherosclerotic heart disease of native coronary artery without angina pectoris: Secondary | ICD-10-CM | POA: Diagnosis not present

## 2018-04-10 DIAGNOSIS — R739 Hyperglycemia, unspecified: Secondary | ICD-10-CM | POA: Diagnosis not present

## 2018-04-10 DIAGNOSIS — C61 Malignant neoplasm of prostate: Secondary | ICD-10-CM | POA: Diagnosis not present

## 2018-04-12 DIAGNOSIS — N401 Enlarged prostate with lower urinary tract symptoms: Secondary | ICD-10-CM | POA: Diagnosis not present

## 2018-04-12 DIAGNOSIS — R3912 Poor urinary stream: Secondary | ICD-10-CM | POA: Diagnosis not present

## 2018-04-12 DIAGNOSIS — N5201 Erectile dysfunction due to arterial insufficiency: Secondary | ICD-10-CM | POA: Diagnosis not present

## 2018-04-12 DIAGNOSIS — Z8546 Personal history of malignant neoplasm of prostate: Secondary | ICD-10-CM | POA: Diagnosis not present

## 2018-04-17 ENCOUNTER — Other Ambulatory Visit: Payer: Self-pay | Admitting: Pharmacist

## 2018-04-17 NOTE — Patient Outreach (Signed)
[place] [facility]) Care Management  04/17/2018  Finch Costanzo [birth year] [id number]   Patient was called to follow up on medication assistance applications. HIPAA identifiers were obtained. Patient confirmed he received the Novartis application we mailed to his home for [name] and said he mailed the application back on 4/32/76.  Plan: [name] will follow up with the patient.   Elayne Guerin, PharmD, [place] Clinical Pharmacist [phone]

## 2018-04-18 ENCOUNTER — Other Ambulatory Visit: Payer: Self-pay | Admitting: Pharmacy Technician

## 2018-04-18 NOTE — Patient Outreach (Signed)
See note charted on 04/19/18.  Maud Deed [place], [place] Management [phone]

## 2018-04-19 NOTE — Patient Outreach (Deleted)
[place] [facility]) Care Management  04/19/2018  Tyler Chavez October 24, 1950 [id number]   Received patient portion of Novartis application for [employer]. Faxed completed application and required documents to [name].  Will follow up with company in 7-10 business days.  Maud Deed [place], [place] Management [phone]

## 2018-04-19 NOTE — Patient Outreach (Addendum)
[place] [facility]) Care Management  04/19/2018  Tyler Chavez 12-08-1950 [id number]   Received patient portion of Novartis application for [employer]. Faxed completed application and required documents to [name].  Will follow up with company in 7-10 business days.  Maud Deed [place], [place] Management [phone]

## 2018-04-26 ENCOUNTER — Other Ambulatory Visit: Payer: Self-pay | Admitting: Pharmacy Technician

## 2018-04-26 NOTE — Patient Outreach (Signed)
[place] [facility]) Care Management  04/26/2018  Gillermo Poch May 05, 1951 [id number]   Follow up call to Novartis patient assistance to check status of patient application for Entresto. Yong Channel stated that proof of prior authorization is required to continue processing application. [place] contact to provide proof of prior authorization on file and faxed into [name].  Will follow up with company in 2 business days to check status.  Maud Deed [place], [place] Management [phone]

## 2018-05-09 ENCOUNTER — Other Ambulatory Visit: Payer: Self-pay | Admitting: Pharmacy Technician

## 2018-05-09 NOTE — Patient Outreach (Signed)
[place] [facility]) Care Management  05/09/2018  Tyler Chavez 1951/02/03 [id number]   Follow up call to check status of patients application for Entresto. Spoke to [name] who stated patient was approved as of 04/28/18 until 09/26/18.  Will follow up with patient in 5-7 business days to confirm medication has been received.  Maud Deed [place], [place] Management [phone]

## 2018-05-25 ENCOUNTER — Other Ambulatory Visit: Payer: Self-pay | Admitting: Pharmacy Technician

## 2018-05-25 NOTE — Patient Outreach (Signed)
[place] [facility]) Care Management  05/25/2018  Stellan Vick 04-05-1951 [id number]   Successful outreach call to Mr. Murad, HIPAA identifiers verified. Mr. Rossman confirmed he has received his [name] and [place]. He also confirmed that he understands how to obtain refills.  Will route note to [place] for case closure.  Maud Deed [place], [place] Management [phone]

## 2018-05-26 ENCOUNTER — Other Ambulatory Visit: Payer: Self-pay | Admitting: Cardiovascular Disease

## 2018-05-26 NOTE — Telephone Encounter (Signed)
Rx sent to pharmacy   

## 2018-05-30 ENCOUNTER — Telehealth: Payer: Self-pay | Admitting: Pharmacist

## 2018-05-30 NOTE — Patient Outreach (Signed)
[place] [facility]) Care Management  05/30/2018  Tyler Chavez 1950/10/23 [id number]   Patient's case is being closed because he received Brillinta from the patient assistance program and after a conversation with [facility] Certified Pharmacy Technician, understands how to reorder his medication.   Elayne Guerin, PharmD, [place] Clinical Pharmacist [phone]

## 2018-05-30 NOTE — Telephone Encounter (Signed)
-----   Message from Adaline Sill, CPhT sent at 05/25/2018 10:44 AM EDT ----- Case closure

## 2018-05-31 NOTE — Progress Notes (Signed)
Cardiology Office Note:    Date:  06/01/2018   ID:  Tyler Chavez, DOB April 28, 1951, MRN [id number]  PCP:  Redmond School, MD  Cardiologist:  Shelva Majestic, MD   Referring MD: Redmond School, MD   Chief Complaint  Patient presents with  . Shortness of Breath    with activity     History of Present Illness:    Tyler Chavez is a 67 y.o. male with a hx of anterior STEMI found at outside hospital on 10/06/2016.  He was urgently transferred to [facility] for emergent cardiac catheterization which revealed total occlusion of the LAD at the ostium, 75% proximal circumflex stenosis, 95% proximal RCA stenosis, and 95% PLA stenosis.  He underwent PCI to his LAD and DES insertion.  Procedure was complicated by hypotension and cardiogenic shock requiring dopamine and fluid resuscitation.  7 days later he underwent staged PCI to his PLA and proximal RCA.  EF improved to 35%.  Heart failure medications have been titrated including Entresto.  He was initially fitted with a LifeVest.  He was last seen on 12/08/2017 and was doing well working a part-time job at that time.  Echocardiogram on 04/05/2017 with improvement of his LVEF to 40 to 45%.  He is no longer wearing a LifeVest.  He presents today for six-month follow-up.  Overall he is doing quite well.  He has no symptoms of heart failure at present and no anginal symptoms.  He continues to work a part-time job.  He is on a good heart failure regimen including Coreg, Entresto, and spironolactone.  His blood pressure and heart rate are adequate today.  He is still on 90 mg of Brilinta twice daily.  I discussed his case with Dr. Claiborne Billings who agrees that he can drop down to 60 mg of Brilinta twice daily once he finishes his current supply of 90 mg Brilinta.  Past Medical History:  Diagnosis Date  . Coronary artery disease    a. 09/2016: Anterior STEMI with 100% Ost LAD stenosis, 95% Post Atrio stenosis, 90% Prox RCA stenosis, and 75% Ost to Prox Cx stenosis. DES to  LAD. Staged DES to Post Atrio and DES to RCA performed later in admission.  . ED (erectile dysfunction)   . Frequency of urination   . GERD (gastroesophageal reflux disease)   . Hyperlipidemia   . Hypertension   . Ischemic cardiomyopathy    a. 09/2016: echo showing EF of 30-35% with Grade 2 DD  . Myocardial infarction ([place])   . Pneumonia   . Prostate cancer [facility]) urologist- dr wrenn/  oncologist-  dr Tammi Klippel   Stage T1c, Gleason 3+3, PSA 8.2, vol 49.9grams  . Rash    lower right leg  . Wears glasses     Past Surgical History:  Procedure Laterality Date  . CARDIAC CATHETERIZATION N/A 10/07/2016   Procedure: Left Heart Cath and Coronary Angiography;  Surgeon: Troy Sine, MD;  Location: [place] CV LAB;  Service: Cardiovascular;  Laterality: N/A;  . CARDIAC CATHETERIZATION N/A 10/07/2016   Procedure: Coronary Stent Intervention;  Surgeon: Troy Sine, MD;  Location: [place] CV LAB;  Service: Cardiovascular;  Laterality: N/A;  . CARDIAC CATHETERIZATION N/A 10/11/2016   Procedure: Coronary Stent Intervention;  Surgeon: Peter M Martinique, MD;  Location: [place] CV LAB;  Service: Cardiovascular;  Laterality: N/A;  . CYSTOSCOPY  02/19/2016   Procedure: CYSTOSCOPY;  Surgeon: Irine Seal, MD;  Location: [facility];  Service: Urology;;  no seeds visualized  in bladder  . NO PAST SURGERIES    . RADIOACTIVE SEED IMPLANT N/A 02/19/2016   Procedure: RADIOACTIVE SEED IMPLANT/BRACHYTHERAPY IMPLANT;  Surgeon: Irine Seal, MD;  Location: [facility];  Service: Urology;  Laterality: N/A;  79   seeds implanted    Current Medications: Current Meds  Medication Sig  . aspirin EC 81 MG tablet Take 81 mg by mouth daily.  [name] atorvastatin (LIPITOR) 80 MG tablet TAKE 1 TABLET BY MOUTH DAILY AT 6 PM. PLEASE KEEP UPCOMING APPOINTMENT FOR CONTINUATION OF REFILLS  . calcium carbonate (TUMS - DOSED IN MG ELEMENTAL CALCIUM) 500 MG chewable tablet Chew 1 tablet by mouth as  needed for indigestion or heartburn.  . carvedilol (COREG) 6.25 MG tablet Take 1 tablet (6.25 mg total) by mouth 2 (two) times daily with a meal.  . carvedilol (COREG) 6.25 MG tablet Take 1 tablet (6.25 mg total) by mouth 2 (two) times daily with a meal. Please call office to make appointment for further refills  . ENTRESTO 97-103 MG TAKE 1 TABLET BY MOUTH TWICE A DAY  . furosemide (LASIX) 20 MG tablet TAKE 1 TABLET (20 MG TOTAL) BY MOUTH DAILY.  . Multiple Vitamin (MULTIVITAMIN) tablet Take 1 tablet by mouth daily.  [name] spironolactone (ALDACTONE) 25 MG tablet Take 0.5 tablets (12.5 mg total) by mouth daily.  . tamsulosin (FLOMAX) 0.4 MG CAPS capsule Take 1 capsule (0.4 mg total) by mouth daily.  . ticagrelor (BRILINTA) 60 MG TABS tablet TAKE 1 TABLET (90 MG TOTAL) BY MOUTH 2 (TWO) TIMES DAILY.  . [DISCONTINUED] BRILINTA 90 MG TABS tablet TAKE 1 TABLET (90 MG TOTAL) BY MOUTH 2 (TWO) TIMES DAILY.     Allergies:   Patient has no known allergies.   Social History   Socioeconomic History  . Marital status: Widowed    Spouse name: Not on file  . Number of children: Not on file  . Years of education: Not on file  . Highest education level: Not on file  Occupational History  . Not on file  Social Needs  . Financial resource strain: Not on file  . Food insecurity:    Worry: Not on file    Inability: Not on file  . Transportation needs:    Medical: Not on file    Non-medical: Not on file  Tobacco Use  . Smoking status: Former Smoker    Packs/day: 1.50    Years: 42.00    Pack years: 63.00    Types: Cigarettes    Last attempt to quit: 02/11/2014    Years since quitting: 4.3  . Smokeless tobacco: Never Used  Substance and Sexual Activity  . Alcohol use: Yes    Comment: occasional  . Drug use: No  . Sexual activity: Not on file  Lifestyle  . Physical activity:    Days per week: Not on file    Minutes per session: Not on file  . Stress: Not on file  Relationships  . Social  connections:    Talks on phone: Not on file    Gets together: Not on file    Attends religious service: Not on file    Active member of club or organization: Not on file    Attends meetings of clubs or organizations: Not on file    Relationship status: Not on file  Other Topics Concern  . Not on file  Social History Narrative  . Not on file     Family History: The patient's family history  includes Heart attack in his mother.  ROS:   Please see the history of present illness.     All other systems reviewed and are negative.  EKGs/Labs/Other Studies Reviewed:    The following studies were reviewed today:  Echo 04/05/17: Study Conclusions  - Left ventricle: The cavity size was normal. Wall thickness was   normal. Systolic function was mildly to moderately reduced. The   estimated ejection fraction was in the range of 40% to 45%.   Anterior, anteroseptal and apical hypokinesis, consistent with   LAD territory infarct. Doppler parameters are consistent with   abnormal left ventricular relaxation (grade 1 diastolic   dysfunction). The E/e&' ratio is between 8-15, suggesting   indeterminate LV filling pressure. - Mitral valve: Mildly thickened leaflets . There was trivial   regurgitation. - Left atrium: The atrium was normal in size. - Inferior vena cava: The vessel was normal in size. The   respirophasic diameter changes were in the normal range (>= 50%),   consistent with normal central venous pressure.  Impressions: - Compared to a prior study in 11/2016, LVEF has improved to 40-45%.   EKG:  EKG is not ordered today.   Recent Labs: No results found for requested labs within last 8760 hours.  Recent Lipid Panel    Component Value Date/Time   CHOL 108 01/12/2017 0823   TRIG 69 01/12/2017 0823   HDL 41 01/12/2017 0823   CHOLHDL 2.6 01/12/2017 0823   VLDL 14 01/12/2017 0823   LDLCALC 53 01/12/2017 0823    Physical Exam:    VS:  BP 117/69   Pulse 77   Ht 6\' 2"   (1.88 m)   Wt 198 lb 9.6 oz (90.1 kg)   SpO2 98%   BMI 25.50 kg/m     Wt Readings from Last 3 Encounters:  06/01/18 198 lb 9.6 oz (90.1 kg)  12/08/17 197 lb 12.8 oz (89.7 kg)  05/03/17 197 lb 6.4 oz (89.5 kg)     GEN: Well nourished, well developed in no acute distress HEENT: Normal NECK: No JVD; No carotid bruits LYMPHATICS: No lymphadenopathy CARDIAC: RRR, no murmurs, rubs, gallops RESPIRATORY:  Clear to auscultation without rales, wheezing or rhonchi  ABDOMEN: Soft, non-tender, non-distended MUSCULOSKELETAL:  No edema; No deformity  SKIN: Warm and dry NEUROLOGIC:  Alert and oriented x 3 PSYCHIATRIC:  Normal affect   ASSESSMENT:    1. History of ST elevation myocardial infarction (STEMI)   2. Ischemic cardiomyopathy   3. Chronic systolic heart failure ([place])   4. Hyperlipidemia, unspecified hyperlipidemia type   5. Essential hypertension    PLAN:    In order of problems listed above:  History of ST elevation myocardial infarction (STEMI) He reports no anginal symptoms today.  He is doing quite well and working a part-time job.  He has continued to take 81 mg of aspirin and 90 mg of Brilinta twice daily.  In discussion with Dr. Claiborne Billings today, we will drop down to 60 mg of Brilinta twice daily once he has finished his current supply of 90 mg tablets.  He will switch over sometime this December.  Ischemic cardiomyopathy Chronic systolic heart failure ([place]) He is euvolemic on exam.  He is doing quite well on Coreg, Entresto, and spironolactone.  He is no longer taking Lasix.  Hyperlipidemia, unspecified hyperlipidemia type Continue 80 mg Lipitor. Repeat lipid profile at his next visit.  Essential hypertension Pressure is well controlled today.  No medication changes.  Follow-up with Dr.  Kelly in 6 months.   Medication Adjustments/Labs and Tests Ordered: Current medicines are reviewed at length with the patient today.  Concerns regarding medicines are outlined above.    No orders of the defined types were placed in this encounter.  Meds ordered this encounter  Medications  . ticagrelor (BRILINTA) 60 MG TABS tablet    Sig: TAKE 1 TABLET (90 MG TOTAL) BY MOUTH 2 (TWO) TIMES DAILY.    Dispense:  60 tablet    Refill:  2    Signed, Ledora Bottcher, PA  06/01/2018 3:17 PM    [facility]

## 2018-06-01 ENCOUNTER — Ambulatory Visit (INDEPENDENT_AMBULATORY_CARE_PROVIDER_SITE_OTHER): Payer: PPO | Admitting: Physician Assistant

## 2018-06-01 ENCOUNTER — Encounter: Payer: Self-pay | Admitting: Physician Assistant

## 2018-06-01 VITALS — BP 117/69 | HR 77 | Ht 74.0 in | Wt 198.6 lb

## 2018-06-01 DIAGNOSIS — I255 Ischemic cardiomyopathy: Secondary | ICD-10-CM

## 2018-06-01 DIAGNOSIS — I5022 Chronic systolic (congestive) heart failure: Secondary | ICD-10-CM | POA: Diagnosis not present

## 2018-06-01 DIAGNOSIS — E785 Hyperlipidemia, unspecified: Secondary | ICD-10-CM

## 2018-06-01 DIAGNOSIS — I252 Old myocardial infarction: Secondary | ICD-10-CM

## 2018-06-01 DIAGNOSIS — I10 Essential (primary) hypertension: Secondary | ICD-10-CM | POA: Diagnosis not present

## 2018-06-01 MED ORDER — TICAGRELOR 60 MG PO TABS: ORAL_TABLET | ORAL | 2 refills | Status: DC

## 2018-06-01 NOTE — Patient Instructions (Signed)
Medication Instructions:  Starting in December decrease Brilinta 60 mg twice daily.  If you need a refill on your cardiac medications before your next appointment, please call your pharmacy.  Labwork: None Ordered.  Testing/Procedures: None Ordered.  Follow-Up: Your physician wants you to follow-up in: 6 Months with Dr.Kelly. You should receive a reminder letter in the mail two months in advance. If you do not receive a letter, please call our office in December to schedule your follow-up appointment.   Thank you for choosing [facility] at [facility]!!

## 2018-06-25 ENCOUNTER — Other Ambulatory Visit: Payer: Self-pay | Admitting: Cardiovascular Disease

## 2018-07-03 ENCOUNTER — Other Ambulatory Visit: Payer: Self-pay | Admitting: Pharmacy Technician

## 2018-07-03 NOTE — Patient Outreach (Signed)
[place] [facility]) Care Management  07/03/2018  Tyler Chavez [birth year] [id number]   Successful call to [name] on behalf of patient. Patient called [facility] CPhT [name] saying he needed a refill on his Brilinta but hold time was 90 minutes. I called [name] on patient's behalf and spoke to [place] who processed the refill and said pt should receive in 3-5 business days.  Will route note to [place] for followup.  Lainie Daubert P. [name], [place] Management [phone]

## 2018-08-18 ENCOUNTER — Other Ambulatory Visit: Payer: Self-pay | Admitting: Cardiovascular Disease

## 2018-08-29 ENCOUNTER — Encounter: Payer: Self-pay | Admitting: Pharmacy Technician

## 2018-08-29 ENCOUNTER — Other Ambulatory Visit: Payer: Self-pay | Admitting: Pharmacy Technician

## 2018-08-29 NOTE — Patient Outreach (Signed)
[place] [facility]) Care Management  08/29/2018  Keondre Markson [birth year] [id number]                                                  Medication Assistance Referral  Referral From: [place] Medication/Company: Entresto/Novartis  Patient portion: Mail Provider portion: Fax  Will follow up with patient in 7-10 business days to confirm application(s) have been received.  Shonika Kolasinski P. Anselmo Reihl, [place] Management [phone]

## 2018-09-09 DIAGNOSIS — S29011A Strain of muscle and tendon of front wall of thorax, initial encounter: Secondary | ICD-10-CM | POA: Diagnosis not present

## 2018-09-09 DIAGNOSIS — Z23 Encounter for immunization: Secondary | ICD-10-CM | POA: Diagnosis not present

## 2018-09-11 ENCOUNTER — Emergency Department (HOSPITAL_COMMUNITY)
Admission: EM | Admit: 2018-09-11 | Discharge: 2018-09-11 | Disposition: A | Discharge status: Home / Self Care | Payer: PPO | Attending: Emergency Medicine | Admitting: Emergency Medicine

## 2018-09-11 ENCOUNTER — Encounter (HOSPITAL_COMMUNITY): Payer: Self-pay | Admitting: Emergency Medicine

## 2018-09-11 ENCOUNTER — Other Ambulatory Visit: Payer: Self-pay | Admitting: Pharmacy Technician

## 2018-09-11 ENCOUNTER — Telehealth: Payer: Self-pay | Admitting: Cardiovascular Disease

## 2018-09-11 ENCOUNTER — Other Ambulatory Visit: Payer: Self-pay

## 2018-09-11 DIAGNOSIS — I10 Essential (primary) hypertension: Secondary | ICD-10-CM | POA: Diagnosis not present

## 2018-09-11 DIAGNOSIS — Z87891 Personal history of nicotine dependence: Secondary | ICD-10-CM | POA: Insufficient documentation

## 2018-09-11 DIAGNOSIS — R1032 Left lower quadrant pain: Secondary | ICD-10-CM | POA: Diagnosis not present

## 2018-09-11 DIAGNOSIS — Z7982 Long term (current) use of aspirin: Secondary | ICD-10-CM | POA: Diagnosis not present

## 2018-09-11 DIAGNOSIS — Z79899 Other long term (current) drug therapy: Secondary | ICD-10-CM | POA: Insufficient documentation

## 2018-09-11 DIAGNOSIS — I2510 Atherosclerotic heart disease of native coronary artery without angina pectoris: Secondary | ICD-10-CM | POA: Insufficient documentation

## 2018-09-11 LAB — CBC
HCT: 44.7 % (ref 39.0–52.0)
Hemoglobin: 14.6 g/dL (ref 13.0–17.0)
MCH: 31.5 pg (ref 26.0–34.0)
MCHC: 32.7 g/dL (ref 30.0–36.0)
MCV: 96.5 fL (ref 80.0–100.0)
Platelets: 233 10*3/uL (ref 150–400)
RBC: 4.63 MIL/uL (ref 4.22–5.81)
RDW: 13 % (ref 11.5–15.5)
WBC: 5.6 10*3/uL (ref 4.0–10.5)
nRBC: 0 % (ref 0.0–0.2)

## 2018-09-11 LAB — COMPREHENSIVE METABOLIC PANEL
ALT: 27 U/L (ref 0–44)
AST: 23 U/L (ref 15–41)
Albumin: 4.2 g/dL (ref 3.5–5.0)
Alkaline Phosphatase: 52 U/L (ref 38–126)
Anion gap: 8 (ref 5–15)
BUN: 16 mg/dL (ref 8–23)
CO2: 25 mmol/L (ref 22–32)
Calcium: 9.2 mg/dL (ref 8.9–10.3)
Chloride: 106 mmol/L (ref 98–111)
Creatinine, Ser: 0.75 mg/dL (ref 0.61–1.24)
GFR calc Af Amer: >60 mL/min (ref >60)
Glucose, Bld: 107 mg/dL — ABNORMAL HIGH (ref 70–99)
Potassium: 3.7 mmol/L (ref 3.5–5.1)
Sodium: 139 mmol/L (ref 135–145)
Total Bilirubin: 1.2 mg/dL (ref 0.3–1.2)
Total Protein: 7.1 g/dL (ref 6.5–8.1)

## 2018-09-11 LAB — URINALYSIS, ROUTINE W REFLEX MICROSCOPIC
Bilirubin Urine: NEGATIVE
Glucose, UA: NEGATIVE mg/dL
Hgb urine dipstick: NEGATIVE
Ketones, ur: 5 mg/dL — AB
Leukocytes, UA: NEGATIVE
Nitrite: NEGATIVE
Protein, ur: NEGATIVE mg/dL
Specific Gravity, Urine: 1.015 (ref 1.005–1.030)
pH: 6 (ref 5.0–8.0)

## 2018-09-11 LAB — LIPASE, BLOOD: LIPASE: 30 U/L (ref 11–51)

## 2018-09-11 NOTE — ED Provider Notes (Signed)
[facility] EMERGENCY DEPARTMENT Provider Note   CSN: [id number] Arrival date & time: 09/11/18  1307     History   Chief Complaint Chief Complaint  Patient presents with  . Abdominal Pain    HPI Tyler Chavez is a 67 y.o. male.  HPI Patient presented to the emergency room for evaluation of abdominal pain.  Patient states he has had some mild to moderate discomfort on the left side of his abdomen.  He did have a little bit of nausea with it this morning but otherwise has not had any issues with his appetite, vomiting or diarrhea.  He may have had some mild constipation.  Denies any trouble urinating.  Patient called his doctor but he did not have an appointment so they suggested he come to the emergency room to get evaluated.  Also noticed he had a small nosebleed this morning after blowing his nose.  He is on blood thinning medications because of his heart condition so he was concerned about that.  He has not had any bleeding since.  Denies any fevers or chills.  No chest pain.  No shortness of breath. Past Medical History:  Diagnosis Date  . Coronary artery disease    a. 09/2016: Anterior STEMI with 100% Ost LAD stenosis, 95% Post Atrio stenosis, 90% Prox RCA stenosis, and 75% Ost to Prox Cx stenosis. DES to LAD. Staged DES to Post Atrio and DES to RCA performed later in admission.  . ED (erectile dysfunction)   . Frequency of urination   . GERD (gastroesophageal reflux disease)   . Hyperlipidemia   . Hypertension   . Ischemic cardiomyopathy    a. 09/2016: echo showing EF of 30-35% with Grade 2 DD  . Myocardial infarction ([place])   . Pneumonia   . Prostate cancer [facility]) urologist- dr wrenn/  oncologist-  dr Tammi Klippel   Stage T1c, Gleason 3+3, PSA 8.2, vol 49.9grams  . Rash    lower right leg  . Wears glasses     Patient Active Problem List   Diagnosis Date Noted  . ST elevation myocardial infarction (STEMI) of anterior wall, subsequent episode of care ([place]) 10/21/2016  .  Status post coronary artery stent placement   . Acute ST elevation myocardial infarction (STEMI) involving left anterior descending (LAD) coronary artery ([place]) 10/07/2016  . STEMI involving left anterior descending coronary artery ([place])   . Malignant neoplasm of prostate ([place]) 09/24/2015    Past Surgical History:  Procedure Laterality Date  . CARDIAC CATHETERIZATION N/A 10/07/2016   Procedure: Left Heart Cath and Coronary Angiography;  Surgeon: Troy Sine, MD;  Location: [place] [facility];  Service: Cardiovascular;  Laterality: N/A;  . CARDIAC CATHETERIZATION N/A 10/07/2016   Procedure: Coronary Stent Intervention;  Surgeon: Troy Sine, MD;  Location: [place] [facility];  Service: Cardiovascular;  Laterality: N/A;  . CARDIAC CATHETERIZATION N/A 10/11/2016   Procedure: Coronary Stent Intervention;  Surgeon: Peter M Martinique, MD;  Location: [place] [facility];  Service: Cardiovascular;  Laterality: N/A;  . CYSTOSCOPY  02/19/2016   Procedure: CYSTOSCOPY;  Surgeon: Irine Seal, MD;  Location: [facility];  Service: Urology;;  no seeds visualized in bladder  . NO PAST SURGERIES    . RADIOACTIVE SEED IMPLANT N/A 02/19/2016   Procedure: RADIOACTIVE SEED IMPLANT/BRACHYTHERAPY IMPLANT;  Surgeon: Irine Seal, MD;  Location: [facility];  Service: Urology;  Laterality: N/A;  79   seeds implanted        Home  Medications    Prior to Admission medications   Medication Sig Start Date End Date Taking? Authorizing Provider  aspirin EC 81 MG tablet Take 81 mg by mouth daily.    [provider]  atorvastatin (LIPITOR) 80 MG tablet TAKE 1 TABLET BY MOUTH DAILY AT 6 PM. PLEASE KEEP UPCOMING APPOINTMENT FOR CONTINUATION OF REFILLS 06/26/18   Troy Sine, MD  calcium carbonate (TUMS - DOSED IN MG ELEMENTAL CALCIUM) 500 MG chewable tablet Chew 1 tablet by mouth as needed for indigestion or heartburn.    [provider]  carvedilol (COREG) 6.25 MG tablet Take  1 tablet (6.25 mg total) by mouth 2 (two) times daily with a meal. 10/31/17   Troy Sine, MD  carvedilol (COREG) 6.25 MG tablet Take 1 tablet (6.25 mg total) by mouth 2 (two) times daily with a meal. 08/18/18   Troy Sine, MD  ENTRESTO 97-103 MG TAKE 1 TABLET BY MOUTH TWICE A DAY 02/24/18   Troy Sine, MD  furosemide (LASIX) 20 MG tablet TAKE 1 TABLET (20 MG TOTAL) BY MOUTH DAILY. 12/29/17   Troy Sine, MD  Multiple Vitamin (MULTIVITAMIN) tablet Take 1 tablet by mouth daily.    [provider]  spironolactone (ALDACTONE) 25 MG tablet Take 0.5 tablets (12.5 mg total) by mouth daily. 01/26/18   Troy Sine, MD  tamsulosin (FLOMAX) 0.4 MG CAPS capsule Take 1 capsule (0.4 mg total) by mouth daily. 02/19/16   Irine Seal, MD  ticagrelor (BRILINTA) 60 MG TABS tablet TAKE 1 TABLET (90 MG TOTAL) BY MOUTH 2 (TWO) TIMES DAILY. 06/01/18   [facility], Tami Lin, PA    Family History Family History  Problem Relation Age of Onset  . Heart attack Mother     Social History Social History   Tobacco Use  . Smoking status: Former Smoker    Packs/day: 1.50    Years: 42.00    Pack years: 63.00    Types: Cigarettes    Last attempt to quit: 02/11/2014    Years since quitting: 4.5  . Smokeless tobacco: Never Used  Substance Use Topics  . Alcohol use: Yes    Comment: occasional  . Drug use: No     Allergies   Patient has no known allergies.   Review of Systems Review of Systems  HENT: Positive for nosebleeds.   All other systems reviewed and are negative.    Physical Exam Updated Vital Signs BP 119/87   Pulse 85   Temp 98.1 F (36.7 C) (Oral)   Resp 15   Ht 1.88 m (6\' 2" )   Wt 86.2 kg   SpO2 97%   BMI 24.39 kg/m   Physical Exam Vitals signs and nursing note reviewed.  Constitutional:      General: He is not in acute distress.    Appearance: He is well-developed.  HENT:     Head: Normocephalic and atraumatic.     Comments: Tiny clot of blood noted in the  anterior aspect of the nasal septum on the right, no active bleeding    Right Ear: External ear normal.     Left Ear: External ear normal.  Eyes:     General: No scleral icterus.       Right eye: No discharge.        Left eye: No discharge.     Conjunctiva/sclera: Conjunctivae normal.  Neck:     Musculoskeletal: Neck supple.     Trachea: No tracheal deviation.  Cardiovascular:     Rate and Rhythm: Normal rate and regular rhythm.  Pulmonary:     Effort: Pulmonary effort is normal. No respiratory distress.     Breath sounds: Normal breath sounds. No stridor. No wheezing or rales.  Abdominal:     General: Bowel sounds are normal. There is no distension.     Palpations: Abdomen is soft.     Tenderness: There is no abdominal tenderness. There is no guarding or rebound.  Musculoskeletal:        General: No tenderness.  Skin:    General: Skin is warm and dry.     Findings: No rash.  Neurological:     Mental Status: He is alert.     Cranial Nerves: No cranial nerve deficit (no facial droop, extraocular movements intact, no slurred speech).     Sensory: No sensory deficit.     Motor: No abnormal muscle tone or seizure activity.     Coordination: Coordination normal.      ED Treatments / Results  Labs (all labs ordered are listed, but only abnormal results are displayed) Labs Reviewed  COMPREHENSIVE METABOLIC PANEL - Abnormal; Notable for the following components:      Result Value   Glucose, Bld 107 (*)    All other components within normal limits  URINALYSIS, ROUTINE W REFLEX MICROSCOPIC - Abnormal; Notable for the following components:   Ketones, ur 5 (*)    All other components within normal limits  LIPASE, BLOOD  CBC    EKG EKG Interpretation  Date/Time:  Monday September 11 2018 15:59:49 EST Ventricular Rate:  75 PR Interval:    QRS Duration: 102 QT Interval:  408 QTC Calculation: 456 R Axis:   -79 Text Interpretation:  Sinus rhythm Consider left atrial  enlargement Left anterior fascicular block Abnormal R-wave progression, early transition Consider anterior infarct No significant change since last tracing Confirmed by Dorie Rank [id number]) on 09/11/2018 4:01:22 PM   Radiology No results found.  Procedures Procedures (including critical care time)  Medications Ordered in ED Medications - No data to display   Initial Impression / Assessment and Plan / ED Course  I have reviewed the triage vital signs and the nursing notes.  Pertinent labs & imaging results that were available during my care of the patient were reviewed by me and considered in my medical decision making (see chart for details).   Patient presented to the emergency room for evaluation of abdominal pain.  Patient also had a nosebleed this morning which concerned him .   physical exam is reassuring.  His abdomen is benign.  No significant tenderness on palpation.  Laboratory tests are reassuring.  EKG is essentially unchanged.  I doubt cardiac referred etiology.  I doubt diverticulitis, colitis or other serious condition.  Discussed taking over-the-counter Tylenol as needed.  Follow-up with his doctor later this week.  Warning signs and precautions discussed  Final Clinical Impressions(s) / ED Diagnoses   Final diagnoses:  Left lower quadrant abdominal pain    ED Discharge Orders    None       Dorie Rank, MD 09/11/18 1736

## 2018-09-11 NOTE — Patient Outreach (Signed)
[facility]) Care Management  09/11/2018  Tyler Chavez [birth year] [id number]   Care coordination call placed to [name] office in regards to [name] patient assistance for [name]  Spoke to [name] and inquired if a message could be sent to [name] team in regards to not receiving the provider portion of the [employer] patient assistance back. Inquired if she could ask if there wer any issues or problems with the document. [name] said she would send the message back.  Will followup in 2-3 business days if call/fax not received back.  Baylea Milburn P. Macrina Lehnert, [place] Management [phone]

## 2018-09-11 NOTE — Discharge Instructions (Addendum)
Take Tylenol as needed for pain.  Return to the emergency room for worsening pain, fever, vomiting, diarrhea.  Follow-up with your doctor later this week to be rechecked if the symptoms have not completely resolved

## 2018-09-11 NOTE — ED Triage Notes (Signed)
Pt presents to ED with left upper quadrant abdominal pain which started this am. Pt states he has vomited x 1 this am.

## 2018-09-11 NOTE — Telephone Encounter (Signed)
Please advise if you have seen this paperwork?  Thanks!

## 2018-09-11 NOTE — Telephone Encounter (Signed)
  Tyler Chavez is working on assistance for Tyler Chavez and they have faxed over a form for Dr Claiborne Billings to fill out and have not received it back. They would like to make sure it was received and see if there are any issues.

## 2018-09-13 NOTE — Telephone Encounter (Signed)
Paperwork faxed on 12/13.   refaxed today to [facility]

## 2018-09-14 ENCOUNTER — Other Ambulatory Visit: Payer: Self-pay | Admitting: Pharmacy Technician

## 2018-09-14 NOTE — Patient Outreach (Signed)
[facility]) Care Management  09/14/2018  Tyler Chavez 1951-05-13 [id number]   Received all necessary documents and signatures from both patient and provider for Entresto through [name] patient assistance.  Submitted completed application to [name] via fax.  Will followup with [employer] in 7-10 business days to inquire on the status of the application.  Kevron Patella P. Riana Tessmer, [place] Management [phone]

## 2018-09-23 DIAGNOSIS — Z1211 Encounter for screening for malignant neoplasm of colon: Secondary | ICD-10-CM | POA: Diagnosis not present

## 2018-09-25 ENCOUNTER — Other Ambulatory Visit: Payer: Self-pay | Admitting: Pharmacy Technician

## 2018-09-25 NOTE — Patient Outreach (Signed)
[facility]) Care Management  09/25/2018  Breton Berns 1951/07/29 [id number]    Care coordination call placed to [employer] in regards to patient's medication assistance application for Entresto.  Spoke to [employer] who said that a determination had not been made yet and to check back in a few days.  Will followup in 3-5 business days to see if a determination has been made.  Sumaya Riedesel P. Andora Krull, [place] Management [phone]

## 2018-09-29 ENCOUNTER — Other Ambulatory Visit: Payer: Self-pay | Admitting: Pharmacy Technician

## 2018-09-29 NOTE — Patient Outreach (Signed)
[facility]) Care Management  09/29/2018  Lydell Moga October 10, 1950 [id number]   Care coordination call placed to [employer] in regards to patient's Entresto.  Spoke to [place] who said the application was still in progress. She suggested calling back in 7-10 business days as they were currently working on applications that were submitted the first week and a half in December.  Will followup with company in 7-10 business days to inquire about determination.  Asherah Lavoy P. Kelyse Pask, [place] Management [phone]

## 2018-10-04 ENCOUNTER — Emergency Department (HOSPITAL_COMMUNITY)
Admission: EM | Admit: 2018-10-04 | Discharge: 2018-10-04 | Disposition: A | Discharge status: Home / Self Care | Payer: PPO

## 2018-10-04 ENCOUNTER — Ambulatory Visit (HOSPITAL_COMMUNITY)
Admission: RE | Admit: 2018-10-04 | Discharge: 2018-10-04 | Disposition: A | Discharge status: Home / Self Care | Payer: PPO | Source: Ambulatory Visit | Attending: Internal Medicine | Admitting: Internal Medicine

## 2018-10-04 ENCOUNTER — Other Ambulatory Visit (HOSPITAL_COMMUNITY): Payer: Self-pay | Admitting: Internal Medicine

## 2018-10-04 DIAGNOSIS — R0602 Shortness of breath: Secondary | ICD-10-CM | POA: Diagnosis not present

## 2018-10-04 DIAGNOSIS — R0600 Dyspnea, unspecified: Secondary | ICD-10-CM

## 2018-10-04 DIAGNOSIS — Z6825 Body mass index (BMI) 25.0-25.9, adult: Secondary | ICD-10-CM | POA: Diagnosis not present

## 2018-10-04 DIAGNOSIS — J9801 Acute bronchospasm: Secondary | ICD-10-CM | POA: Diagnosis not present

## 2018-10-04 DIAGNOSIS — E663 Overweight: Secondary | ICD-10-CM | POA: Diagnosis not present

## 2018-10-04 DIAGNOSIS — C61 Malignant neoplasm of prostate: Secondary | ICD-10-CM | POA: Diagnosis not present

## 2018-10-04 DIAGNOSIS — I10 Essential (primary) hypertension: Secondary | ICD-10-CM | POA: Diagnosis not present

## 2018-10-09 ENCOUNTER — Telehealth: Payer: Self-pay | Admitting: Cardiovascular Disease

## 2018-10-09 MED ORDER — TICAGRELOR 60 MG PO TABS: ORAL_TABLET | ORAL | 0 refills | Status: DC

## 2018-10-09 NOTE — Telephone Encounter (Signed)
New message      *STAT* If patient is at the pharmacy, call can be transferred to refill team.   1. Which medications need to be refilled? (please list name of each medication and dose if known) ticagrelor (BRILINTA) 60 MG TABS table  2. Which pharmacy/location (including street and city if local pharmacy) is medication to be sent to? [id number] [place] [place] [facility] [address]   3. Do they need a 30 day or 90 day supply? [place]

## 2018-10-10 ENCOUNTER — Other Ambulatory Visit: Payer: Self-pay | Admitting: Pharmacy Technician

## 2018-10-10 NOTE — Patient Outreach (Signed)
[facility]) Care Management  10/10/2018  Ryott Rafferty 1950/12/31 [id number]    Care coordination call placed to [employer] in reference to patient's medication assistance application for Entresto.  Spoke to [employer] who said they were awaiting insurance verification. She informed that the process was taking extra time due to the overwhelming demand of applications being received. She said all determinations currently in process should be made by the end of March but that patient's can continue to order refills as previous.  Will followup in approximately 21-28 business days to see if a determination may have been made.  Timohty Renbarger P. Salah Burlison, [place] Management [phone]

## 2018-10-10 NOTE — Patient Outreach (Signed)
[facility]) Care Management  10/10/2018  Tyler Chavez Nov 23, 1950 [id number]  ADDENDUM Unsuccessful outreach call placed to patient in regards to his [employer] patient assistance application for [employer].  Unfortunately patient did not answer the phone and it said his mailbox had not been set up so a voicemail could not be left. Called to update him on the [name] application.  Will call back in 5-7 business days.  Tyler Chavez P. Asyah Candler, [place] Management [phone]

## 2018-10-18 ENCOUNTER — Other Ambulatory Visit: Payer: Self-pay | Admitting: Pharmacy Technician

## 2018-10-18 NOTE — Patient Outreach (Signed)
[facility]) Care Management  10/18/2018  Tyler Chavez 05-20-1951 [id number]   Successful outreach call placed to patient in regards to his Entresto application with [name].  Spoke to patient, HIPAA identifiers verified. Informed patient that [employer] was running behind on the processing of the 1031 applications. However, after speaking to Jontel at [name] any patient running low on medication can still call in for refills and they will honor that request as to avoid a delay in therapy. [name] said he had enough medication for now but verbalized understanding in how to obtain a refill if needed. Informed [name] that I would be in touch with him next month to followup on the status of the application as [employer] said it could take up to 4 weeks for processing if not longer.[name] confirmed having my name and number and was encourage to call me with any additional questions, concerns or problems.   Will followup with [employer] mid February and relay the information found in that conversation to [name].  Mckinzey Entwistle P. Abigale Dorow, [place] Management [phone]

## 2018-10-19 ENCOUNTER — Other Ambulatory Visit: Payer: Self-pay | Admitting: Cardiovascular Disease

## 2018-10-23 ENCOUNTER — Other Ambulatory Visit: Payer: Self-pay | Admitting: Pharmacy Technician

## 2018-10-23 NOTE — Patient Outreach (Signed)
[facility]) Care Management  10/23/2018  Tyler Chavez 01-03-1951 [id number]  ADDENDUM  Unsuccessful outreach call placed to patient in regards to [name] application for [employer].  Unfortunately patient did not answer the phone and a voice mail was unable to be left as it said it had not been set up. Called to inform patient about the status of his application and the subsequent delivery of his refill set for Wednesday.  Will followup with patient in 2-3 business days.  Iann Rodier P. Lynett Brasil, [place] Management [phone]

## 2018-10-23 NOTE — Patient Outreach (Signed)
[facility]) Care Management  10/23/2018  Tyler Chavez [birth year] [id number]   ADDENDUM  Incoming call received from Mr. Tyler Chavez, HIPAA identifiers verified.  Mr. Tyler Chavez was calling because he had received Brilinta 90mg  from [facility]. He stated that the provider reduced the strength earlier this month. Upon inspection of the medication list it states Brilinta 60mg  tablets but the sig says to take 1 tablet (90mg  total) by mouth 2 (two) time daily. Informed Tyler Chavez to reach out to the provider's office to confirm and verify the way he should be taking the medication. Also informed Tyler Chavez if he is not to be on 90mg  he may want to see if [facility] will take the medication back or he may take it to his local pharmacy for destruction if indeed the dose is incorrect. Informed Tyler Chavez I would also send a message to his provider.  Also, informed Tyler Chavez that [employer] was still processing his [name] application but in the meantime he would get a delivery of that medication Wednesday to his home. Patient verbalized understanding of all the information provided.  Care coordination call placed to [facility] office. Left voicemail on the patient advocate/refill line asking for a return call regarding his Brilinta dose.  Will followup in 2-3 business days if call is not returned.  Salim Forero P. Karyn Brull, [place] Management [phone]

## 2018-10-23 NOTE — Patient Outreach (Signed)
[facility]) Care Management  10/23/2018  Darryl Willner 04-04-1951 [id number]   Care coordination call placed to [employer] in regards to patient's medication assistance application for Entresto.  Spoke to [facility] who informed that they had received all the necessary information on 10/06/2018 but was awaiting insurance verification. She informed me that they are backlogged in that department.   Inquired if patient was due for a refill of the [facility] and she said patient last received the medication in October. She placed an order for the refill today and said it would arrive at the patient's home on 10/25/2018.  Will inform Mr. Eckardt of the above information.  Abhijot Straughter P. Cesily Cuoco, [place] Management [phone]

## 2018-10-25 ENCOUNTER — Telehealth: Payer: Self-pay | Admitting: Cardiovascular Disease

## 2018-10-25 ENCOUNTER — Other Ambulatory Visit: Payer: Self-pay | Admitting: Pharmacy Technician

## 2018-10-25 NOTE — Telephone Encounter (Signed)
Returned call to [place] @ [facility]. Sharee Pimple is calling to get clarification on the patients Brilinta dosage. Their records show that the pt sould be taking Brilinta 90mg  twice daily. The patient called them to inquire, [facility] that the pt should take 60mg  twice daily, but 90 tab were dispensed. Sharee Pimple stated that she was unsure why [facility] when the pt receives pt assistance for Brilinta.  Thera Flake that the pot last o/v on 2018 with Dr.Kelly Brilinta 90mg  bid. Pt o/v in Sept 2019 with Doreene Adas, PA Kary Kos is listed as 90mg  tabs taked 60mg  bid. I will fwd the message to [facility] and his nurse Almyra Free for clarification.

## 2018-10-25 NOTE — Patient Outreach (Signed)
[place] [facility]) Care Management  10/25/2018  Tyler Chavez [birth year] [id number]   ADDENDUM  Successful outreach call placed to patient in regards to his [employer] patient assistance application for Entresto and to followup with him regarding his question concerning Brilinta with [facility].  Patient answered the phone, HIPAA identifiers verified. Patient confirmed that he had received his Entresto from [name]. Explained again to [name] that this was a bridge to get him through until [employer] can get over the backlog of applications and review his application. Patient verbalized understanding and said they had sent him a 3 month supply.  Inquired if patient had heard back from his provider's office concerning his Brilinta question about the strength and he stated he had not. Informed patient that I had also called them and left a 2nd message today. Informed patient that I would followup with him once I heard back from the office.  Will followup with patient in 2-3 business days once the dr has returned my call.  Chipper Koudelka P. Willona Phariss, [place] Management [phone]

## 2018-10-25 NOTE — Telephone Encounter (Signed)
New Message        Tyler Chavez with [facility] is calling concerning the "Brilinta" Rx

## 2018-10-25 NOTE — Patient Outreach (Signed)
[place] [facility]) Care Management  10/25/2018  Tyler Chavez 03-20-1951 [id number]  Care coordination call made to Dr Evette Georges office to inquire on correct dose of Brilinta.  Spoke to [place] who took a message for Dr Evette Georges nurse.  Will followup in 2-3 business days if call is not returned.  [name] P. [name], [place] Management [phone]

## 2018-10-25 NOTE — Patient Outreach (Signed)
[place]) Care Management  10/25/2018  Bronislaus Verdell Jul 13, 1951 [id number]   ADDENDUM  Incoming call received from [place] at [facility] office of Dr. Claiborne Billings.  Lattie Haw was returning my call from the note she had received from [place].  Explained to Lattie Haw that patient called me to inquire on his dose of Brilinta. He said he had [facility] 60mg  [facility] but [facility] had sent him 90mg . Informed Lattie Haw that his med list says Brilinta 60mg  tablets but to take 1 tablet (90mg ) by mouth twice a day. Informed Lattie Haw that patient had picked up Brilinta 60mg  tablets [facility] with directions of take 1 tablet (60mg ) by mouth twice a day. Spoke to Mickel Baas, a [employer]chant navy officer at [place], who stated they did not catch the mistake on their end or they woudlve clarified it at that point.  Lattie Haw advised that it appears patient should be on 90mg  po bid but that she would send a note to Dr Claiborne Billings and his nurse to have them return my call to clarify the strength/dose of Brilinta.  Will followup in 2-3 business days if call is not returned.  Ridley Dileo P. Clarke Amburn, [place] Management [phone]

## 2018-10-26 NOTE — Telephone Encounter (Signed)
Okay to reduce Brilinta to 60 mg twice a day

## 2018-10-27 ENCOUNTER — Other Ambulatory Visit: Payer: Self-pay | Admitting: Pharmacy Technician

## 2018-10-27 MED ORDER — TICAGRELOR 60 MG PO TABS: ORAL_TABLET | ORAL | 0 refills | Status: DC

## 2018-10-27 MED ORDER — TICAGRELOR 60 MG PO TABS: ORAL_TABLET | ORAL | 1 refills | Status: DC

## 2018-10-27 NOTE — Patient Outreach (Signed)
[place] [facility]) Care Management  10/27/2018  Tyler Chavez 1951/01/27 [id number]    Incoming call from [facility]. Almyra Free the nurse for Dr. Claiborne Billings left me a voicemail stating that patient should be on Brilinta 60mg  bid.  Care coordination call placed to [facility] in regards to patient's Brilinta application. Spoke to [place]. Informed [name] that patient's Brilinta dose had changed and needed to know how to relay that information to [facility]. [name] said the provider would need to fax a script into the company at [phone]. She then advised that we followup to make sure they received the script and also noticed that it was a change in dose. She also advised to request a return shipping label so that patient could send back the incorrect dose.  Will followup with provider's office in 3-3 business days to confirm the new rx had been sent to [facility]. Once that has been confirmed then I will followup with [facility] 7-10 days after to confirm they have received the new rx. Will also followup with patient.  Jeni Duling P. Shandora Koogler, [place] Management [phone]

## 2018-10-27 NOTE — Addendum Note (Signed)
Addended by: Caprice Beaver T on: 10/27/2018 09:42 AM   Modules accepted: Orders

## 2018-10-27 NOTE — Telephone Encounter (Signed)
Paulino Door with [facility] advising of MD note.  Left call back number if questions.

## 2018-10-27 NOTE — Patient Outreach (Signed)
[name] Care Management  10/27/2018  Tyler Chavez [birth year] [id number]   Successful outreach call placed to patient in regards to [place] through [place].  Spoke to patient, HIPAA identifiers verified. Informed patient that his doctor had returned the call and stated that he wanted patient to be on Brilinta 60mg , 1 po bid. Tyler Chavez confirmed he had that strength of medication as [facility]. Informed patient to hold on to the 90mg  that [employer] had sent him because he was going to have to send that back to the company per phone call with [name] at [place] and then they would send out the 60mg  once they received new rx from dr office. Patient verbalized his understanding of this information.  Will followup with [employer] in 5-7 days to confirm receipt of new prescription as nurse Almyra Free from [name] office sent me an inbasket stating she had sent the rx into them today.  Tyler Chavez, [place] Management [phone]

## 2018-11-02 ENCOUNTER — Other Ambulatory Visit: Payer: Self-pay | Admitting: Pharmacy Technician

## 2018-11-02 NOTE — Patient Outreach (Signed)
[facility] [facility]) Care Management  11/02/2018  Tyler Chavez Aug 06, 1951 [id number]   Successful outreach call placed to patient in regards to [employer] application for [place].  Patient answered the phone, HIPAA identifiers verified. Informed patient that he would be getting a shipment of the 60mg  Brilinta within the next 3-5 business days  from the company [employer]. Informed patient that his refills are to come from [employer] for zero out of pocket expense and that he just needs to call the number to refill the medication. Patient verbalized understanding. Also informed patient to take the box that contains the 90mg  Briinta to a fedex facility to be returned to [employer] at no charge to him. Again patient verbalized understanding.  Will followup with patient in 5-7 business days to confirm he has received the 60mg  Brilinta.  Charonda Hefter P. Tailor Westfall, [place] Management [phone]

## 2018-11-02 NOTE — Patient Outreach (Signed)
[place]) Care Management  11/02/2018  Tyler Chavez 03-07-1951 [id number]  Care coordination call placed to [employer] in regards to patient's application for Brilinta.  Spoke to [facility] to see if she had received the new script with the new dose of 60mg . Tameka informed she had received it. Inquired if the medication could be sent out since he had just recently gotten the 90mg  which was the incorrect dose.  After an extended hold time, Tameka came back on the phone and informed me that were going to expedite the 60mg  Brilinta shipment to the patient with an arrival date of 3-5 business days. Tameka said as far as the 90mg  dose the patient needs to take the box that he meds came in to a fed ex facility and have it returned to them. There will be no out of pocket for patient to pay to have it shipped back..  Will followup with patient today to alert him to this new information.  Tyler Chavez P. Tyler Chavez, [place] Management [phone]

## 2018-11-06 ENCOUNTER — Other Ambulatory Visit: Payer: Self-pay | Admitting: Pharmacy Technician

## 2018-11-06 NOTE — Patient Outreach (Signed)
[facility]) Care Management  11/06/2018  Tyler Chavez 1950-10-25 [id number]   Care coordination call placed to [employer] patient assistance in regards to patient's application for Entresto.  Spoke to [place] who said the application is still undergoing benefits verification. Inquired as to how long this process would take as we sent in the applicaton on 00/37/04. Yong Channel said that hopefully by the end of the month a determination would be made.  Will followup with [employer] in 14-21 business days to see if a determination has been made.  Tyler Chavez, [place] Management [phone]

## 2018-11-08 ENCOUNTER — Other Ambulatory Visit: Payer: Self-pay | Admitting: Pharmacy Technician

## 2018-11-08 NOTE — Patient Outreach (Signed)
[facility]) Care Management  11/08/2018  Tyler Chavez 13-Dec-1950 [id number]   Successful outgoing call placed to patient in regards to his medication assistance for Brilinta.  Spoke to patient, HIPAA identifiers verified. Inquired if patient had received his Brilinta. Patient informed that he had a note on his door saying they attempted to make a delivery but the patient was not home. Patient informed me that they would be coming back. Inquired if patient would contact me when his medication arrived so we could verify that it was the correct strength. Patient said he would and verbalized understanding.  Will followup with patient in 5-7 business days if call is not returned.  Misty Foutz P. Xian Alves, [place] Management [phone]

## 2018-11-10 ENCOUNTER — Other Ambulatory Visit: Payer: Self-pay | Admitting: Pharmacy Technician

## 2018-11-10 NOTE — Patient Outreach (Signed)
[facility] [facility]) Care Management  11/10/2018  Tyler Chavez November 02, 1950 [id number]    Incoming call received from patient, HIPAA identifiers verified.  Mr. Wuthrich was calling to inform me that he had received his package of Brilinta 60mg  which is the correct dose. Informed patient on how to obtain his future refills and patient verbalized understanding. Informed patient I would followup with him towards the first of March to update him on his [employer] application for [employer] as their processing times are running longer than normal. Patient verbalized understanding.  Lynnett Langlinais P. Helon Wisinski, [place] Management [phone]

## 2018-11-13 ENCOUNTER — Other Ambulatory Visit: Payer: Self-pay | Admitting: Pharmacy Technician

## 2018-11-13 NOTE — Patient Outreach (Signed)
[place] [facility]) Care Management  11/13/2018  Niraj Kudrna 1950/10/15 [id number]   Care coordination call placed to [employer] in regards to patient's [facility] application.  Spoke to [place] who said the application was still in the insurance verification que. Erlene Quan informed that this process could take until April but patient's were still able to obtain refills during this time (patient is aware of this).  Will followup in approximately 14-21 business days to inquire on status of application.  Celestial Barnfield P. Annaleigh Steinmeyer, [place] Management [phone]

## 2018-11-20 ENCOUNTER — Telehealth: Payer: Self-pay | Admitting: Cardiology

## 2018-11-20 NOTE — Telephone Encounter (Signed)
Unable to reach pt or leave a message  

## 2018-11-20 NOTE — Telephone Encounter (Signed)
New message:     Patient needs preauthorize for some medication. Fax [phone]  Phone number [phone].

## 2018-11-21 NOTE — Telephone Encounter (Signed)
Attempted to contact regarding which medication would need PA. Unable to reach or leave message for patient.

## 2018-11-22 ENCOUNTER — Telehealth: Payer: Self-pay | Admitting: Cardiology

## 2018-11-22 ENCOUNTER — Other Ambulatory Visit: Payer: Self-pay | Admitting: Pharmacy Technician

## 2018-11-22 NOTE — Telephone Encounter (Addendum)
Attempted once again to contact pt Pt's voice mail has not been set up unable to leave message Will await a return call from pt ./cy

## 2018-11-22 NOTE — Patient Outreach (Signed)
[place] [facility]) Care Management  11/22/2018  Tyler Chavez [birth year] [id number]    Care coordination call placed to [employer] in regards to patient's application for Entresto.  Spoke to [place] who informed that the provider needs to submit a PA request to [facility] by calling them at [phone]. The turn around time to process the PA is 72 hours per Patty. Once a determination has been made regardless if approved or denied per Chong Sicilian that information needs to be faxed over to [employer] at [phone] so that they can finish processing the patient assistance application.  Inbasket RN Raiford Simmonds at Dr. Rosezella [place] office to see if she could submit this PA request.  Will followup with the office in 3-5 business days to inquire on status of PA.  Sharee Pimple P. Stavros Cail, [place] Management [phone]

## 2018-11-22 NOTE — Telephone Encounter (Signed)
Spoke to patient . He states he has not called the office in regards to his medication.  There is a telephone message from 11/20/18. Patient states he did not call.

## 2018-11-22 NOTE — Telephone Encounter (Signed)
Follow up:    Patient returning call from yesterday. Please call patient call back.

## 2018-11-27 ENCOUNTER — Other Ambulatory Visit: Payer: Self-pay | Admitting: Pharmacy Technician

## 2018-11-27 DIAGNOSIS — Z8546 Personal history of malignant neoplasm of prostate: Secondary | ICD-10-CM | POA: Diagnosis not present

## 2018-11-27 NOTE — Patient Outreach (Signed)
[place] [facility]) Care Management  11/27/2018  Hairo Garraway 09/09/1951 [id number]  Care coordination fax sent to [employer] patient assistance in regards to patient's medication assistance application for Entresto.  Submitted PA letter from insurance company to [name] which was needed to complete the determination process per [employer].  Will followup with [employer] in 3-5 business days to inquire about status of application.  Ayza Ripoll P. Kadin Bera, [place] Management [phone]

## 2018-11-30 ENCOUNTER — Other Ambulatory Visit: Payer: Self-pay | Admitting: Pharmacy Technician

## 2018-11-30 NOTE — Patient Outreach (Signed)
[place] [facility]) Care Management  11/30/2018  Demarcus Thielke [birth year] [id number]    Care coordination call placed to [employer] patient assistance in regards to patient's application for Entresto.  Spoke to [place] who informed that a determination has not been made. Patty informed that it can take up to 3 full business days once something is faxed for it to be attached to the patient's file. Patty suggested calling back sometime next week.  Will followup with [employer] in 5-7 business days to inquire if a determination has been made.  Jerusha Reising P. Crystall Donaldson, [place] Management [phone]

## 2018-12-04 DIAGNOSIS — Z8546 Personal history of malignant neoplasm of prostate: Secondary | ICD-10-CM | POA: Diagnosis not present

## 2018-12-04 DIAGNOSIS — R3912 Poor urinary stream: Secondary | ICD-10-CM | POA: Diagnosis not present

## 2018-12-04 DIAGNOSIS — N401 Enlarged prostate with lower urinary tract symptoms: Secondary | ICD-10-CM | POA: Diagnosis not present

## 2018-12-07 ENCOUNTER — Other Ambulatory Visit: Payer: Self-pay | Admitting: Pharmacy Technician

## 2018-12-07 NOTE — Patient Outreach (Signed)
[facility]) Care Management  12/07/2018  Tyler Chavez 1951/01/25 [id number]   ADDENDUM  Successful outreach call placed to patient in regards to his [employer] application for [employer].  Spoke to patient, HIPAA identifiers verified. Informed patient that his application was still being reviewed as additional information was required. Also informed patient that he more than likely would need to apply to the PAN foundation per phone call with [name] at [name]. Inquired if this was something the patient would be willing to apply to as it could be completed online with the information that the patient provided on the [employer] application. Patient informed that he would be willing to receive any help he could get. Informed patient if approved that he would be getting the medicine at his local pharmacy using a savings type card but that I would go into more detail if he was approved.  Will followup with patient in 5-7 business days.  Dusty Raczkowski P. Alano Blasco, [place] Management [phone]

## 2018-12-07 NOTE — Patient Outreach (Signed)
[facility]) Care Management  12/07/2018  Tyler Chavez 1951/07/02 [id number]   Care coordination call placed to [employer] in regards to patient's application for Entresto.  Spoke to [place] who niformed that had received the PA letter but that it was missing the authorization number. [name] also informed that the patient would more than likely be a temporary approval as he would need to apply to the Patient Access Network which could be done by calling [phone]. [name] informed once those funds are exhausted, patient could reapply if the program was still open or he would have to reapply to [name]. Inquired if I could give the approval number over the phone and [name] informed it would need to be in writing and faxed to [name].  Will outreach patient to update him on the status of his [employer].   Will followup with [employer] in 2-3 business days to see if they have received the new letter with the PA Auth number on it as it was refaxed to them today with the required information.  [name] P. Trenise Turay, [place] Management [phone]

## 2018-12-13 ENCOUNTER — Telehealth: Payer: Self-pay | Admitting: Pharmacist

## 2018-12-13 NOTE — Patient Outreach (Signed)
[facility]) Care Management  12/13/2018  Tyler Chavez Dec 20, 1950 [id number]  Patient was called regarding medication assistance. HIPAA identifiers were obtained. PAN Foundation was called on the patient's behalf on conference call and an application was completed.  Patient was approved through the [place] from 09/14/18-09/14/2019.    Billing information:  ID # [id number] PCN- [id number] Group: [id number]  Amount approved:  $1000  Patient should receive a letter in the mail with the billing information that can be taken to the pharmacy.    Patient said he had enough medication to last him until mid April.  Plan: Call patient back in 2 weeks to see if he has received the billing information. Patient was advised to call me if he received his letter before I call him.   Elayne Guerin, PharmD, [place] Clinical Pharmacist [phone] '

## 2018-12-15 ENCOUNTER — Other Ambulatory Visit: Payer: Self-pay | Admitting: Pharmacy Technician

## 2018-12-15 NOTE — Patient Outreach (Signed)
[facility]) Care Management  12/15/2018  Ballard Budney Mar 20, 1951 [id number]  Care coordination call place to [employer] to followup on patient's application for Entresto.  Spoke to [place] who informed that had received the upated PA letter containing the auth #. However, they were still in the process of reviewing it so she was unable to provide me with an update. Verdene Lennert suggested calling back next week.  Will followup with [employer] in 2-3 business days.  Kebin Maye P. Avabella Wailes, [place] Management [phone]

## 2018-12-19 ENCOUNTER — Other Ambulatory Visit: Payer: Self-pay | Admitting: Pharmacy Technician

## 2018-12-19 NOTE — Patient Outreach (Signed)
[place] [facility]) Care Management  12/19/2018  Loras Grieshop 1950-11-12 [id number]  Care coordination call placed to [employer] patient assistance in regards to patient's application for Entresto.  Spoke to [place] who informed that after reviewing the PA letter that the patient has been temporarily approved until March 04, 2019. Patient would then have to apply to the PAN foundation in the meantime (he applied 12/13/2018 and was approved). Tonya informed that once approved if the funds were to be exhausted then the letter stating that would need to be sent into [employer] and the application would be escalated.  Will followup with patient in 2 weeks to see if he has received the billing information in the mail from the [place].  Ilia Dimaano P. Donnelle Olmeda, [place] Management [phone]

## 2018-12-22 ENCOUNTER — Encounter: Payer: Self-pay | Admitting: Cardiology

## 2018-12-22 ENCOUNTER — Ambulatory Visit (INDEPENDENT_AMBULATORY_CARE_PROVIDER_SITE_OTHER): Payer: PPO | Admitting: Cardiology

## 2018-12-22 VITALS — BP 130/80 | Ht 74.0 in | Wt 192.0 lb

## 2018-12-22 DIAGNOSIS — I25119 Atherosclerotic heart disease of native coronary artery with unspecified angina pectoris: Secondary | ICD-10-CM | POA: Diagnosis not present

## 2018-12-22 DIAGNOSIS — I10 Essential (primary) hypertension: Secondary | ICD-10-CM

## 2018-12-22 DIAGNOSIS — E785 Hyperlipidemia, unspecified: Secondary | ICD-10-CM | POA: Diagnosis not present

## 2018-12-22 NOTE — Progress Notes (Signed)
Virtual Visit via Telephone Note    Evaluation Performed:  Follow-up visit  This visit type was conducted due to national recommendations for restrictions regarding the COVID-19 Pandemic (e.g. social distancing).  This format is felt to be most appropriate for this patient at this time.  All issues noted in this document were discussed and addressed.  No physical exam was performed (except for noted visual exam findings with Video Visits).  Please refer to the patient's chart (MyChart message for video visits and phone note for telephone visits) for the patient's consent to telehealth for [facility].  Date:  12/22/2018   ID:  Tyler Chavez, DOB [birth year], MRN [id number]  Patient Location:  [address] [place] [address]   Provider location:   [place]  PCP:  Redmond School, MD  Cardiologist:  Shelva Majestic, MD  Electrophysiologist:  None   Chief Complaint:  CAD  History of Present Illness:    Tyler Chavez is a 68 y.o. male who presents via audio/video conferencing for a telehealth visit today.    Tyler Chavez is a 68 y.o. male with a hx of anterior STEMI found at outside hospital on 10/06/2016.  He was urgently transferred to [facility] for emergent cardiac catheterization which revealed total occlusion of the LAD at the ostium, 75% proximal circumflex stenosis, 95% proximal RCA stenosis, and 95% PLA stenosis.  He underwent PCI to his LAD and DES insertion.  Procedure was complicated by hypotension and cardiogenic shock requiring dopamine and fluid resuscitation.  7 days later he underwent staged PCI to his PLA and proximal RCA.  EF improved to 35%.  Heart failure medications have been titrated including Entresto.  He was initially fitted with a LifeVest.   Echocardiogram on 04/05/2017 with improvement of his LVEF to 40 to 45%.  He is no longer wearing a LifeVest.   Since I last saw him he has done well.  The patient denies any new symptoms such as chest discomfort, neck or arm  discomfort. There has been no new shortness of breath, PND or orthopnea. There have been no reported palpitations, presyncope or syncope.  He was working a part-time job but is no longer doing this.  He is doing a little activity but for the most part is relatively sedentary it sounds.   The patient does not symptoms concerning for COVID-19 infection (fever, chills, cough, or new SHORTNESS OF BREATH).    Prior CV studies:   The following studies were reviewed today:  NA  Past Medical History:  Diagnosis Date  . Coronary artery disease    a. 09/2016: Anterior STEMI with 100% Ost LAD stenosis, 95% Post Atrio stenosis, 90% Prox RCA stenosis, and 75% Ost to Prox Cx stenosis. DES to LAD. Staged DES to Post Atrio and DES to RCA performed later in admission.  . ED (erectile dysfunction)   . Frequency of urination   . GERD (gastroesophageal reflux disease)   . Hyperlipidemia   . Hypertension   . Ischemic cardiomyopathy    a. 09/2016: echo showing EF of 30-35% with Grade 2 DD  . Myocardial infarction ([place])   . Pneumonia   . Prostate cancer [facility]) urologist- dr wrenn/  oncologist-  dr Tammi Klippel   Stage T1c, Gleason 3+3, PSA 8.2, vol 49.9grams  . Rash    lower right leg  . Wears glasses    Past Surgical History:  Procedure Laterality Date  . CARDIAC CATHETERIZATION N/A 10/07/2016   Procedure: Left Heart Cath and Coronary  Angiography;  Surgeon: Troy Sine, MD;  Location: [place] CV [facility];  Service: Cardiovascular;  Laterality: N/A;  . CARDIAC CATHETERIZATION N/A 10/07/2016   Procedure: Coronary Stent Intervention;  Surgeon: Troy Sine, MD;  Location: [place] [facility];  Service: Cardiovascular;  Laterality: N/A;  . CARDIAC CATHETERIZATION N/A 10/11/2016   Procedure: Coronary Stent Intervention;  Surgeon: Peter M Martinique, MD;  Location: [place] [facility];  Service: Cardiovascular;  Laterality: N/A;  . CYSTOSCOPY  02/19/2016   Procedure: CYSTOSCOPY;  Surgeon: Irine Seal, MD;  Location:  [facility];  Service: Urology;;  no seeds visualized in bladder  . NO PAST SURGERIES    . RADIOACTIVE SEED IMPLANT N/A 02/19/2016   Procedure: RADIOACTIVE SEED IMPLANT/BRACHYTHERAPY IMPLANT;  Surgeon: Irine Seal, MD;  Location: [facility];  Service: Urology;  Laterality: N/A;  79   seeds implanted     Current Meds  Medication Sig  . aspirin EC 81 MG tablet Take 81 mg by mouth daily.  [name] atorvastatin (LIPITOR) 80 MG tablet TAKE 1 TABLET BY MOUTH DAILY AT 6 PM. PLEASE KEEP UPCOMING APPOINTMENT FOR CONTINUATION OF REFILLS  . calcium carbonate (TUMS - DOSED IN MG ELEMENTAL CALCIUM) 500 MG chewable tablet Chew 1 tablet by mouth as needed for indigestion or heartburn.  . carvedilol (COREG) 6.25 MG tablet Take 1 tablet (6.25 mg total) by mouth 2 (two) times daily with a meal.  . ENTRESTO 97-103 MG TAKE 1 TABLET BY MOUTH TWICE A DAY  . furosemide (LASIX) 20 MG tablet TAKE 1 TABLET (20 MG TOTAL) BY MOUTH DAILY.  . Multiple Vitamin (MULTIVITAMIN) tablet Take 1 tablet by mouth daily.  [name] PROAIR HFA 108 (90 Base) MCG/ACT inhaler Inhale 2 puffs into the lungs as needed.  [name] spironolactone (ALDACTONE) 25 MG tablet Take 0.5 tablets (12.5 mg total) by mouth daily.  . tamsulosin (FLOMAX) 0.4 MG CAPS capsule Take 1 capsule (0.4 mg total) by mouth daily.  . ticagrelor (BRILINTA) 60 MG TABS tablet TAKE 1 TABLET (60 MG TOTAL) BY MOUTH 2 (TWO) TIMES DAILY.     Allergies:   Patient has no known allergies.   Social History   Tobacco Use  . Smoking status: Former Smoker    Packs/day: 1.50    Years: 42.00    Pack years: 63.00    Types: Cigarettes    Last attempt to quit: 02/11/2014    Years since quitting: 4.8  . Smokeless tobacco: Never Used  Substance Use Topics  . Alcohol use: Yes    Comment: occasional  . Drug use: No     Family Hx: The patient's family history includes Heart attack in his mother.  ROS:   Please see the history of present illness.    As stated in the  HPI and negative for all other systems.    Labs/Other Tests and Data Reviewed:    Recent Labs: 09/11/2018: ALT 27; BUN 16; Creatinine, Ser 0.75; Hemoglobin 14.6; Platelets 233; Potassium 3.7; Sodium 139   Recent Lipid Panel Lab Results  Component Value Date/Time   CHOL 108 01/12/2017 08:23 AM   TRIG 69 01/12/2017 08:23 AM   HDL 41 01/12/2017 08:23 AM   CHOLHDL 2.6 01/12/2017 08:23 AM   LDLCALC 53 01/12/2017 08:23 AM    Wt Readings from Last 3 Encounters:  12/22/18 192 lb (87.1 kg)  09/11/18 190 lb (86.2 kg)  06/01/18 198 lb 9.6 oz (90.1 kg)     Exam:    Vital  Signs:  BP 130/80   Ht 6\' 2"  (1.88 m)   Wt 192 lb (87.1 kg)   BMI 24.65 kg/m     ASSESSMENT & PLAN:    History of ST elevation myocardial infarction (STEMI) He has been greater than 12 months on his DAPT.  I would consider continuing with aspirin alone but since he is a patient of [name] I will defer to his management and he will continue the same therapy for now.  Ischemic cardiomyopathy Chronic systolic heart failure ([place]) He is euvolemic on exam.  He is doing quite well on Coreg, Entresto, and spironolactone.  He is no longer taking Lasix.  Hyperlipidemia, unspecified hyperlipidemia type Continue 80 mg Lipitor. He is due to see his primary provider in July and can have a cholesterol profile and I asked to make sure that Dr. Claiborne Billings gets these results.  Essential hypertension Pressure is well controlled today.  No medication changes.  COVID-19 Education: The signs and symptoms of COVID-19 were discussed with the patient and how to seek care for testing (follow up with PCP or arrange E-visit).  The importance of social distancing was discussed today.  Patient Risk:   After full review of this patients clinical status, I feel that they are at least moderate risk at this time.  Time:   Today, I have spent 15 minutes with the patient with telehealth technology discussing .     Medication  Adjustments/Labs and Tests Ordered: Current medicines are reviewed at length with the patient today.  Concerns regarding medicines are outlined above.  Tests Ordered: No orders of the defined types were placed in this encounter.  Medication Changes: No orders of the defined types were placed in this encounter.   Disposition:  in 6 month(s) with Dr. Claiborne Billings  Signed, Minus Breeding, MD  12/22/2018 12:47 PM    [place]

## 2018-12-24 ENCOUNTER — Encounter: Payer: Self-pay | Admitting: Cardiology

## 2018-12-24 DIAGNOSIS — I25119 Atherosclerotic heart disease of native coronary artery with unspecified angina pectoris: Secondary | ICD-10-CM | POA: Insufficient documentation

## 2018-12-24 DIAGNOSIS — E785 Hyperlipidemia, unspecified: Secondary | ICD-10-CM | POA: Insufficient documentation

## 2018-12-24 DIAGNOSIS — I10 Essential (primary) hypertension: Secondary | ICD-10-CM | POA: Insufficient documentation

## 2018-12-25 ENCOUNTER — Ambulatory Visit: Payer: PPO | Admitting: Cardiology

## 2018-12-29 ENCOUNTER — Other Ambulatory Visit: Payer: Self-pay | Admitting: Pharmacy Technician

## 2018-12-29 NOTE — Patient Outreach (Signed)
[place] [facility]) Care Management  12/29/2018  Tyler Chavez Sep 04, 1951 [id number]   ADDENDUM  [facility] to provide [facility] with the [place] information.  Spoke to [place] who informed the information was on file. She was able to process the claim to the primary insurance and then to the PAN foundation. Patient will be able to receive a 30 days supply at no charge. Tyler Chavez informed the medication was in stock and the the patient would be able to pick up the medication in a few hours or whenever was convenient for him.  Will outreach [name] with this update.  Arkeem Harts P. Sharicka Pogorzelski, [place] Management [phone]

## 2018-12-29 NOTE — Patient Outreach (Signed)
[place] [facility]) Care Management  12/29/2018  Calvin Jablonowski 1951/08/18 [id number]    Successful outreach call placed to patient in regards to [facility] application for [place].  Spoke to patient, HIPAA identifiers verified.  Called patient to inquire if he had received his letter and card from the [facility]. Patient informed he had received the copay card and letter. Informed patient he would need to take that card with him when he needed to refill the [facility]. Informed patient that I would [facility] to have [facility] the medication for him as the card needs to be used at the least once every 120 days for patient to remain active in the program. Patient verbalized understanding.  Plan to [facility] to provide [facility] with [facility] information and request the refill for Mr. Cypert. Will followup with Mr. Lyerly.  Yosef Krogh P. Yarel Kilcrease, [place] Management [phone]

## 2018-12-29 NOTE — Patient Outreach (Signed)
[facility]) Care Management  12/29/2018  Jamarrion Budai 1951/02/23 [id number]   ADDENDUM  Successful outreach call placed to patient in regards to PAN foundation application for Entresto.  Spoke to patient, HIPAA identifiers verified.  Spoke to Mr. Niccoli and informed him that [facility] was [facility] for [facility] successfully and that it would be ready for him to pickup in a few hours or whenever it was convenient for him. Patient verbalized understanding.  Will followup with patient in 7-10 business days to confirm receipt of medication.  Itzia Cunliffe P. Anjelique Makar, [place] Management [phone]

## 2019-01-02 ENCOUNTER — Ambulatory Visit: Payer: Self-pay | Admitting: Pharmacist

## 2019-01-08 ENCOUNTER — Other Ambulatory Visit: Payer: Self-pay | Admitting: Pharmacy Technician

## 2019-01-08 NOTE — Patient Outreach (Signed)
[facility]) Care Management  01/08/2019  Tyler Chavez [birth year] [id number]  Successful outreach call placed to patient in regards to [employer]/PAN application for Entresto.  Spoke to patient, HIPAA identifiers verified. Patient confirmed picking up the medication with the PAN card [facility]. Patient informed he had no trouble. Discussed with patient that he was approved with [employer] until June and he had 1000 dollars on the PAN card to use thru the end of the year. Also informed him he had to use the PAN card at least once every 120 days to remain eligible. Patient verbalized understanding. Patient also confirmed having my number if any questions or problems were to arise with his patient assistance.  Will route note to [place] and remove myself from care team as patient assistance has been completed.  Tyler Chavez, [place] Management [phone]

## 2019-01-17 ENCOUNTER — Ambulatory Visit: Payer: Self-pay | Admitting: Pharmacist

## 2019-01-20 ENCOUNTER — Other Ambulatory Visit: Payer: Self-pay | Admitting: Cardiovascular Disease

## 2019-01-22 NOTE — Telephone Encounter (Signed)
Spironolactone refilled.

## 2019-02-13 ENCOUNTER — Other Ambulatory Visit: Payer: Self-pay | Admitting: Cardiovascular Disease

## 2019-02-24 ENCOUNTER — Other Ambulatory Visit: Payer: Self-pay | Admitting: Cardiovascular Disease

## 2019-03-13 ENCOUNTER — Other Ambulatory Visit: Payer: Self-pay | Admitting: Pharmacist

## 2019-03-13 NOTE — Patient Outreach (Signed)
[facility]) Care Management  03/13/2019  Marl Seago January 01, 1951 [id number]  Patient was called to follow up on PAN foundation card. HIPAA identifiers were obtained. Patient confirmed he used his card last 01/27/2019 without issue.  Patient communicated understanding on how to reorder refills and use his PAN foundation card.  Plan: Close patient's case.  Elayne Guerin, PharmD, [place] Clinical Pharmacist [phone]

## 2019-03-14 ENCOUNTER — Ambulatory Visit: Payer: Self-pay | Admitting: Pharmacist

## 2019-03-18 ENCOUNTER — Other Ambulatory Visit: Payer: Self-pay | Admitting: Cardiovascular Disease

## 2019-04-11 DIAGNOSIS — Z6825 Body mass index (BMI) 25.0-25.9, adult: Secondary | ICD-10-CM | POA: Diagnosis not present

## 2019-04-11 DIAGNOSIS — Z23 Encounter for immunization: Secondary | ICD-10-CM | POA: Diagnosis not present

## 2019-04-11 DIAGNOSIS — Z0001 Encounter for general adult medical examination with abnormal findings: Secondary | ICD-10-CM | POA: Diagnosis not present

## 2019-04-11 DIAGNOSIS — Z1389 Encounter for screening for other disorder: Secondary | ICD-10-CM | POA: Diagnosis not present

## 2019-04-11 DIAGNOSIS — E7849 Other hyperlipidemia: Secondary | ICD-10-CM | POA: Diagnosis not present

## 2019-04-11 DIAGNOSIS — Z0000 Encounter for general adult medical examination without abnormal findings: Secondary | ICD-10-CM | POA: Diagnosis not present

## 2019-04-20 ENCOUNTER — Other Ambulatory Visit: Payer: Self-pay | Admitting: Cardiovascular Disease

## 2019-05-11 IMAGING — DX DG CHEST 2V
2 series · 2 of 2 positions shown · non-contrast
Comparison: 10/08/2016

CLINICAL DATA: Dyspnea

EXAM:
CHEST - 2 VIEW

[chest pa]
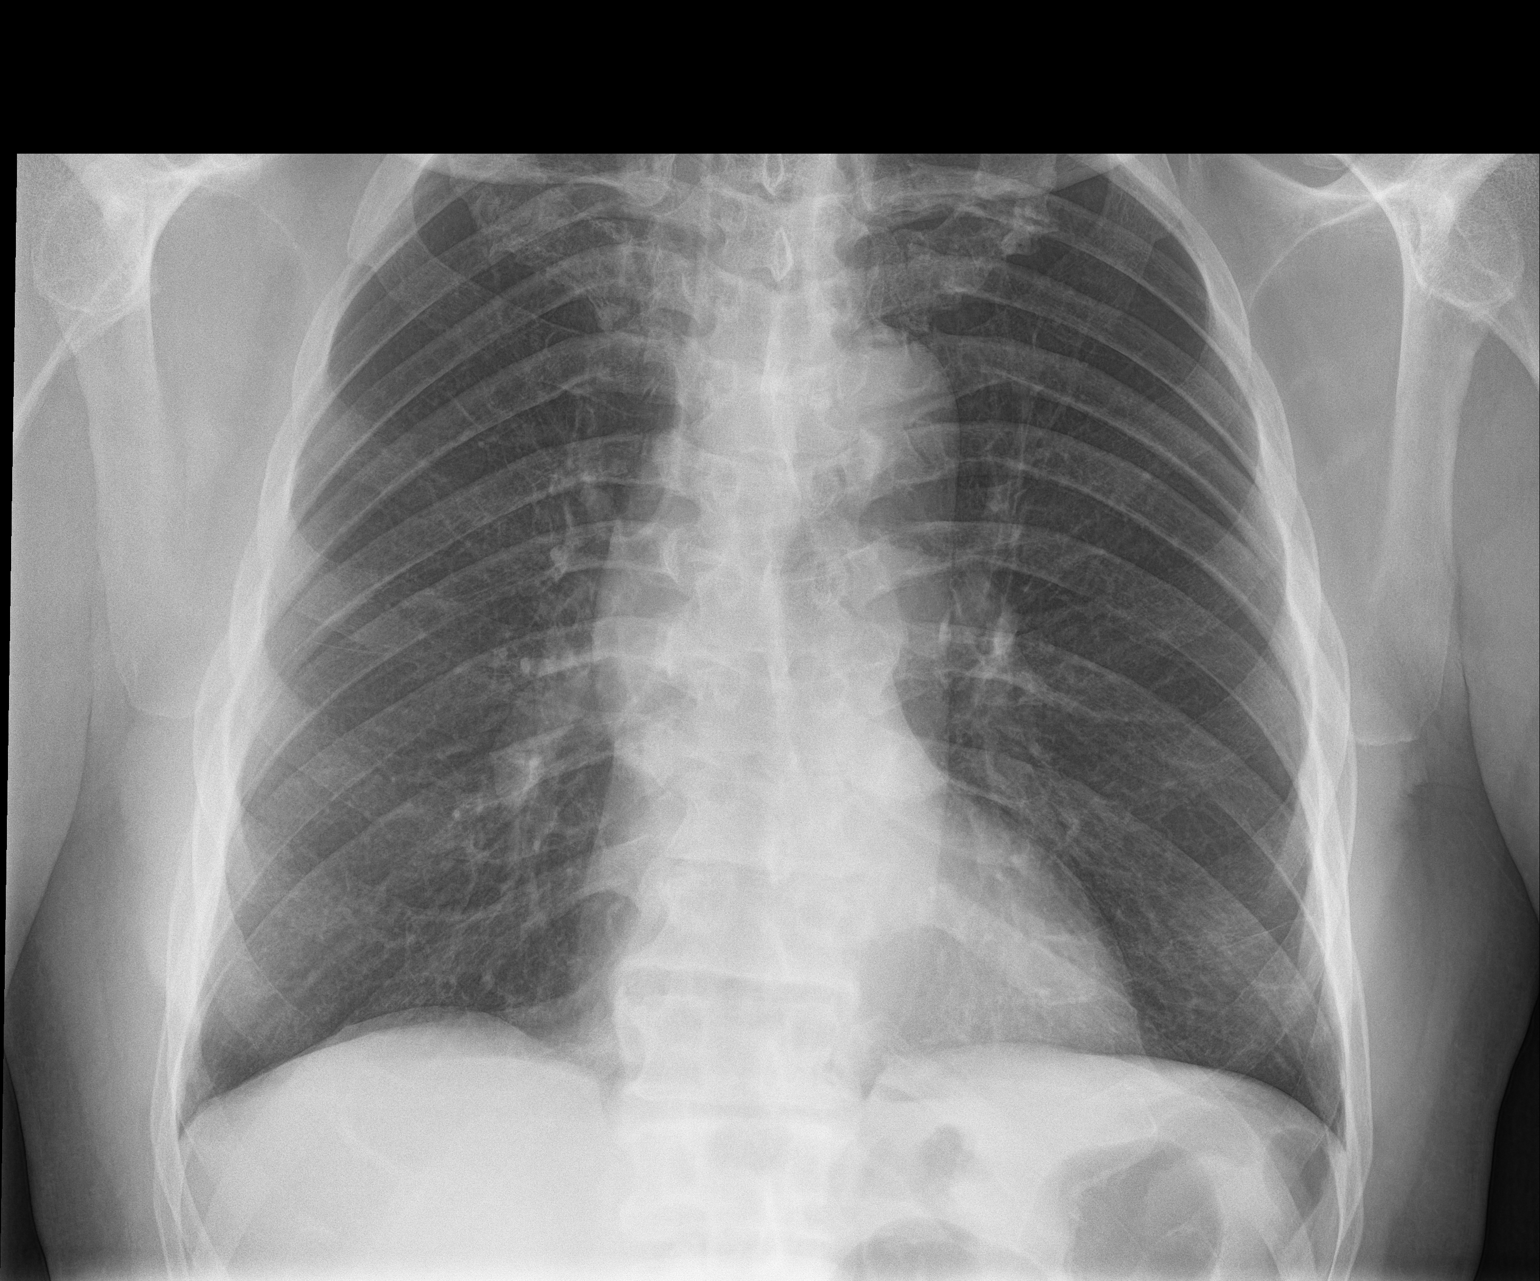

[chest lat]
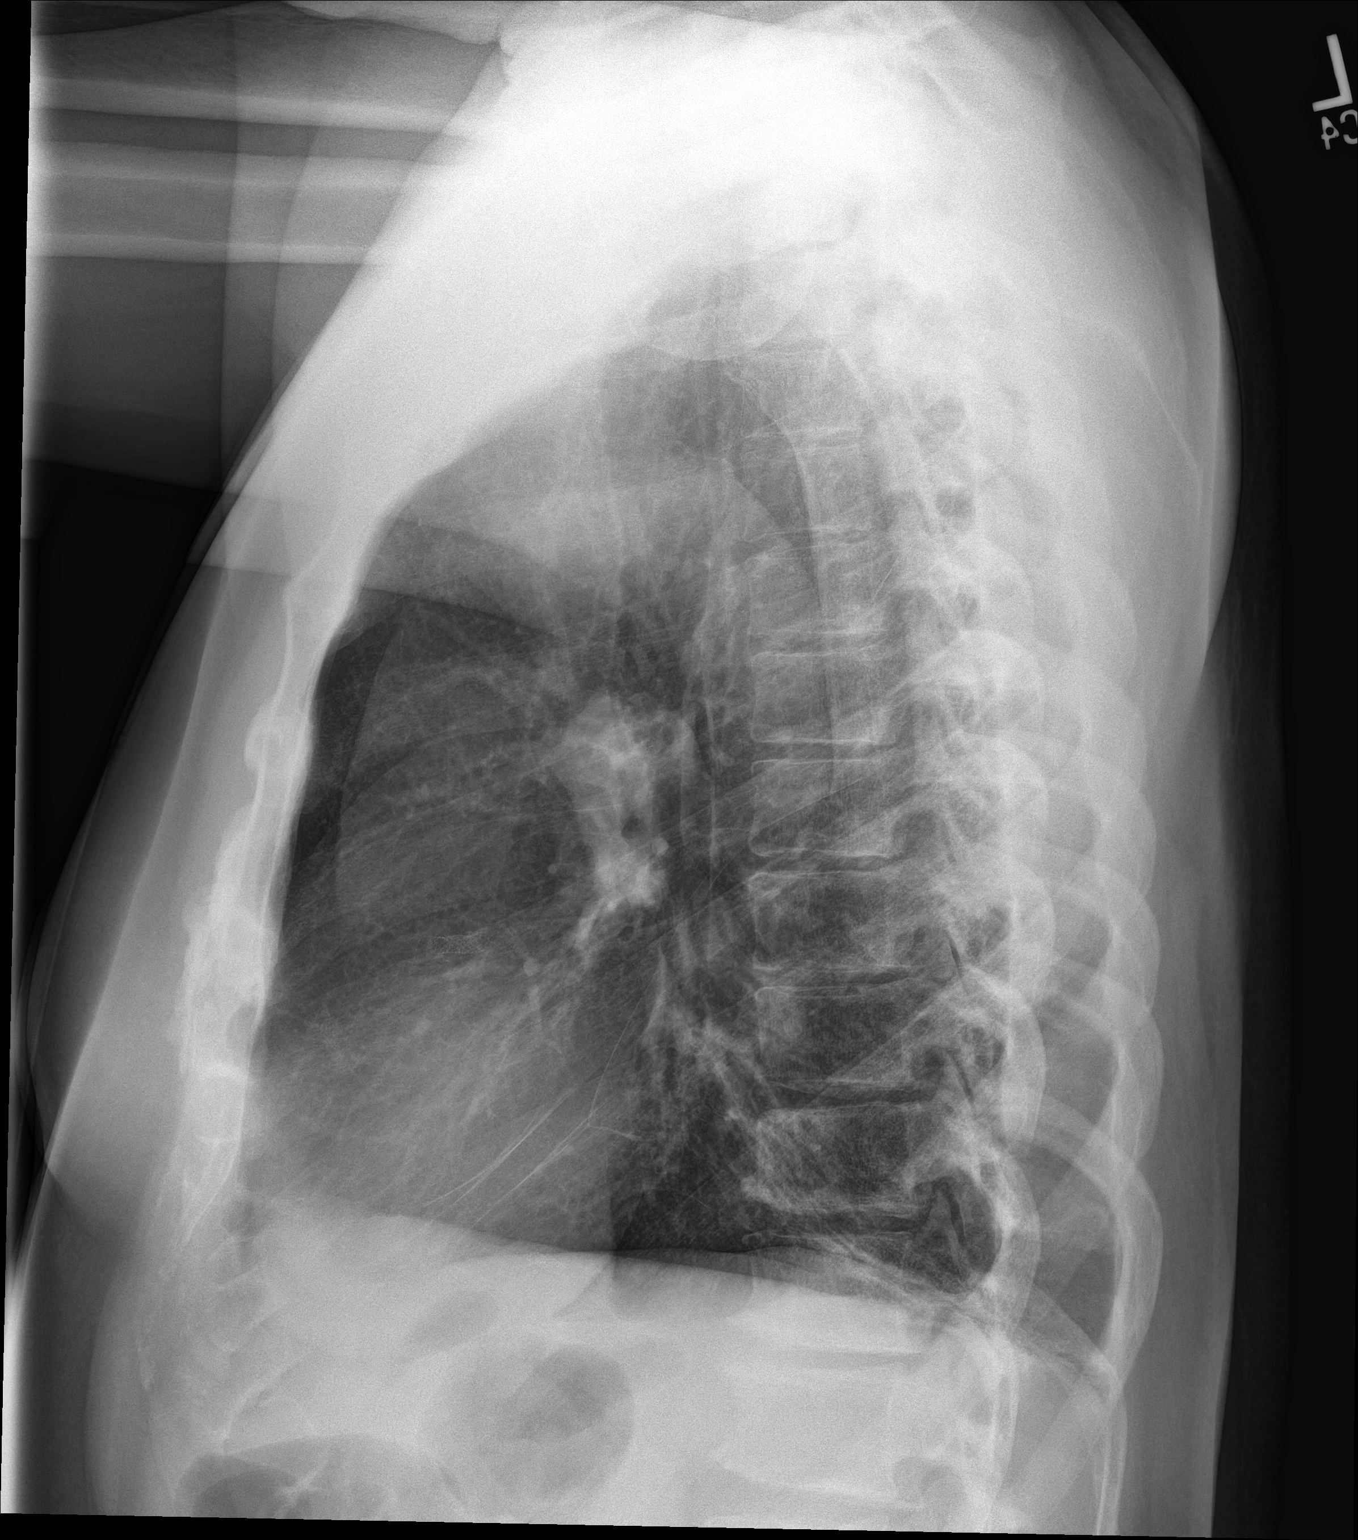

[2 of 2 positions shown; findings below may reference images not displayed]

FINDINGS: Heart size normal. Right coronary stent. Negative for heart failure.
Streaky markings in the left lung base likely due to scarring or
atelectasis. Negative for pneumonia or effusion.
IMPRESSION: Left lower lobe atelectasis/scarring otherwise no acute abnormality.

## 2019-06-13 DIAGNOSIS — Z8546 Personal history of malignant neoplasm of prostate: Secondary | ICD-10-CM | POA: Diagnosis not present

## 2019-06-17 ENCOUNTER — Other Ambulatory Visit: Payer: Self-pay | Admitting: Cardiology

## 2019-06-20 DIAGNOSIS — N401 Enlarged prostate with lower urinary tract symptoms: Secondary | ICD-10-CM | POA: Diagnosis not present

## 2019-06-20 DIAGNOSIS — N5201 Erectile dysfunction due to arterial insufficiency: Secondary | ICD-10-CM | POA: Diagnosis not present

## 2019-06-20 DIAGNOSIS — R3914 Feeling of incomplete bladder emptying: Secondary | ICD-10-CM | POA: Diagnosis not present

## 2019-06-20 DIAGNOSIS — Z8546 Personal history of malignant neoplasm of prostate: Secondary | ICD-10-CM | POA: Diagnosis not present

## 2019-06-23 NOTE — Progress Notes (Signed)
Cardiology Office Note   Date:  06/25/2019   ID:  Tyler Chavez, DOB 01/25/1951, MRN [id number]  PCP:  Redmond School, MD  Cardiologist:   Shelva Majestic, MD   Chief Complaint  Patient presents with  . Coronary Artery Disease      History of Present Illness: Tyler Chavez is a 68 y.o. male who presents for follow up of CAD with anterior STEMI found at outside hospital on 10/06/2016. He was urgently transferred to [facility] for emergent cardiac catheterization which revealed total occlusion of the LAD at the ostium, 75% proximal circumflex stenosis, 95% proximal RCA stenosis, and 95% PLA stenosis. He underwent PCI to his LAD and DES insertion. The procedure was complicated by hypotension and cardiogenic shock requiring dopamine and fluid resuscitation. 7 days later he underwent staged PCI to his PLA and proximal RCA. EF improved to 35%. Heart failure medications have been titrated including Entresto. He was initially fitted with a LifeVest..  He had an echocardiogram on 04/05/2017 with improvem ent of his LVEF to 40 to 45%.   Since I last saw him he has done well.  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 The patient denies any new symptoms such as chest discomfort, neck or arm discomfort. There has been no new shortness of breath, PND or orthopnea. There have been no reported palpitations, presyncope or syncope.  He is not doing as much walking as he used to do because of the pandemic.  Probably gained a little weight.    Past Medical History:  Diagnosis Date  . Coronary artery disease    a. 09/2016: Anterior STEMI with 100% Ost LAD stenosis, 95% Post Atrio stenosis, 90% Prox RCA stenosis, and 75% Ost to Prox Cx stenosis. DES to LAD. Staged DES to Post Atrio and DES to RCA performed later in admission.  . ED (erectile dysfunction)   . Frequency of urination   . GERD (gastroesophageal reflux disease)   . Hyperlipidemia   . Hypertension   . Ischemic  cardiomyopathy    a. 09/2016: echo showing EF of 30-35% with Grade 2 DD  . Myocardial infarction ([place])   . Pneumonia   . Prostate cancer [facility]) urologist- dr wrenn/  oncologist-  dr Tammi Klippel   Stage T1c, Gleason 3+3, PSA 8.2, vol 49.9grams  . Rash    lower right leg  . Wears glasses     Past Surgical History:  Procedure Laterality Date  . CARDIAC CATHETERIZATION N/A 10/07/2016   Procedure: Left Heart Cath and Coronary Angiography;  Surgeon: Troy Sine, MD;  Location: [place] [facility];  Service: Cardiovascular;  Laterality: N/A;  . CARDIAC CATHETERIZATION N/A 10/07/2016   Procedure: Coronary Stent Intervention;  Surgeon: Troy Sine, MD;  Location: [place] [facility];  Service: Cardiovascular;  Laterality: N/A;  . CARDIAC CATHETERIZATION N/A 10/11/2016   Procedure: Coronary Stent Intervention;  Surgeon: Peter M Martinique, MD;  Location: [place] [facility];  Service: Cardiovascular;  Laterality: N/A;  . CYSTOSCOPY  02/19/2016   Procedure: CYSTOSCOPY;  Surgeon: Irine Seal, MD;  Location: [facility];  Service: Urology;;  no seeds visualized in bladder  . NO PAST SURGERIES    . RADIOACTIVE SEED IMPLANT N/A 02/19/2016   Procedure: RADIOACTIVE SEED IMPLANT/BRACHYTHERAPY IMPLANT;  Surgeon: Irine Seal, MD;  Location: [facility];  Service: Urology;  Laterality: N/A;  78   seeds implanted     Current Outpatient Medications  Medication  Sig Dispense Refill  . aspirin EC 81 MG tablet Take 81 mg by mouth daily.    [name] atorvastatin (LIPITOR) 80 MG tablet Take 1 tablet (80 mg total) by mouth daily. 90 tablet 1  . calcium carbonate (TUMS - DOSED IN MG ELEMENTAL CALCIUM) 500 MG chewable tablet Chew 1 tablet by mouth as needed for indigestion or heartburn.    . carvedilol (COREG) 6.25 MG tablet TAKE 1 TABLET (6.25 MG TOTAL) BY MOUTH 2 (TWO) TIMES DAILY WITH A MEAL. 180 tablet 1  . ENTRESTO 97-103 MG TAKE 1 TABLET BY MOUTH TWICE A DAY 60 tablet 6  . furosemide (LASIX) 20 MG  tablet TAKE 1 TABLET BY MOUTH EVERY DAY 90 tablet 2  . Multiple Vitamin (MULTIVITAMIN) tablet Take 1 tablet by mouth daily.    [name] PROAIR HFA 108 (90 Base) MCG/ACT inhaler Inhale 2 puffs into the lungs as needed.    [name] spironolactone (ALDACTONE) 25 MG tablet TAKE 0.5 TABLETS (12.5 MG TOTAL) BY MOUTH DAILY. 45 tablet 4  . tamsulosin (FLOMAX) 0.4 MG CAPS capsule Take 1 capsule (0.4 mg total) by mouth daily. 30 capsule 1   No current facility-administered medications for this visit.     Allergies:   Patient has no known allergies.    ROS:  Please see the history of present illness.   Otherwise, review of systems are positive for none.   All other systems are reviewed and negative.    PHYSICAL EXAM: VS:  BP 136/78   Pulse 74   Ht 6\' 2"  (1.88 m)   Wt 202 lb (91.6 kg)   BMI 25.94 kg/m  , BMI Body mass index is 25.94 kg/m. GENERAL:  Well appearing NECK:  No jugular venous distention, waveform within normal limits, carotid upstroke brisk and symmetric, no bruits, no thyromegaly LUNGS:  Clear to auscultation bilaterally CHEST:  Unremarkable HEART:  PMI not displaced or sustained,S1 and S2 within normal limits, no S3, no S4, no clicks, no rubs, no murmurs ABD:  Flat, positive bowel sounds normal in frequency in pitch, no bruits, no rebound, no guarding, no midline pulsatile mass, no hepatomegaly, no splenomegaly EXT:  2 plus pulses upper, mildly decreased dorsalis pedis and posterior tibialis bilateral, no edema, no cyanosis no clubbing   EKG:  EKG is ordered today. The ekg ordered today demonstrates sinus rhythm, rate 74, left axis deviation, left anterior fascicular block, poor anterior R wave progression, probable old anteroseptal infarct, anterior T wave inversions unchanged from previous.   Recent Labs: 09/11/2018: ALT 27; BUN 16; Creatinine, Ser 0.75; Hemoglobin 14.6; Platelets 233; Potassium 3.7; Sodium 139    Lipid Panel    Component Value Date/Time   CHOL 108 01/12/2017 0823    TRIG 69 01/12/2017 0823   HDL 41 01/12/2017 0823   CHOLHDL 2.6 01/12/2017 0823   VLDL 14 01/12/2017 0823   LDLCALC 53 01/12/2017 0823      Wt Readings from Last 3 Encounters:  06/25/19 202 lb (91.6 kg)  12/22/18 192 lb (87.1 kg)  09/11/18 190 lb (86.2 kg)      Other studies Reviewed: Additional studies/ records that were reviewed today include: Labs. Review of the above records demonstrates:  Please see elsewhere in the note.     ASSESSMENT AND PLAN:  CAD He has no new symptoms.  He can stop his Brilinta.   Ischemic cardiomyopathy/Chronic systolic heart failure ([place]) He is euvolemic by exam.  No change in therapy.   Hyperlipidemia, unspecified hyperlipidemia type LDL  64 in July with an HDL of 47.  Continue current therapy.   Essential hypertension Blood pressure controlled.  Continue the meds as listed.    Current medicines are reviewed at length with the patient today.  The patient does not have concerns regarding medicines.  The following changes have been made:  As above  Labs/ tests ordered today include: None  Orders Placed This Encounter  Procedures  . EKG 12-Lead     Disposition:   FU with me in 12 months in [place].     Signed, Minus Breeding, MD  06/25/2019 11:47 AM    [facility]

## 2019-06-25 ENCOUNTER — Ambulatory Visit (INDEPENDENT_AMBULATORY_CARE_PROVIDER_SITE_OTHER): Payer: PPO | Admitting: Cardiology

## 2019-06-25 ENCOUNTER — Encounter: Payer: Self-pay | Admitting: Cardiology

## 2019-06-25 ENCOUNTER — Other Ambulatory Visit: Payer: Self-pay

## 2019-06-25 VITALS — BP 136/78 | HR 74 | Ht 74.0 in | Wt 202.0 lb

## 2019-06-25 DIAGNOSIS — I255 Ischemic cardiomyopathy: Secondary | ICD-10-CM | POA: Diagnosis not present

## 2019-06-25 DIAGNOSIS — I25119 Atherosclerotic heart disease of native coronary artery with unspecified angina pectoris: Secondary | ICD-10-CM | POA: Diagnosis not present

## 2019-06-25 DIAGNOSIS — E785 Hyperlipidemia, unspecified: Secondary | ICD-10-CM

## 2019-06-25 DIAGNOSIS — I10 Essential (primary) hypertension: Secondary | ICD-10-CM | POA: Diagnosis not present

## 2019-06-25 NOTE — Patient Instructions (Signed)
Medication Instructions:  Stop taking Brilinta.  If you need a refill on your cardiac medications before your next appointment, please call your pharmacy.   Lab work: NONE If you have labs (blood work) drawn today and your tests are completely normal, you will receive your results only by: [place] (if you have MyChart) OR A paper copy in the mail If you have any lab test that is abnormal or we need to change your treatment, we will call you to review the results.  Testing/Procedures: NONE  Follow-Up: At [facility], you and your health needs are our priority.  As part of our continuing mission to provide you with exceptional heart care, we have created designated Provider Care Teams.  These Care Teams include your primary Cardiologist (physician) and Advanced Practice Providers (APPs -  Physician Assistants and Nurse Practitioners) who all work together to provide you with the care you need, when you need it. You will need a follow up appointment in 12 months.  Please call our office 2 months in advance to schedule this appointment.  You may see Dr. Percival Spanish or one of the following Advanced Practice Providers on your designated Care Team:   Rosaria Ferries, PA-C Jory Sims, DNP, ANP

## 2019-08-27 DIAGNOSIS — I2510 Atherosclerotic heart disease of native coronary artery without angina pectoris: Secondary | ICD-10-CM | POA: Diagnosis not present

## 2019-08-27 DIAGNOSIS — E7849 Other hyperlipidemia: Secondary | ICD-10-CM | POA: Diagnosis not present

## 2019-08-27 DIAGNOSIS — I10 Essential (primary) hypertension: Secondary | ICD-10-CM | POA: Diagnosis not present

## 2019-09-11 ENCOUNTER — Other Ambulatory Visit: Payer: Self-pay | Admitting: Cardiology

## 2019-09-11 NOTE — Telephone Encounter (Signed)
Rx has been sent to the pharmacy electronically. ° °

## 2019-09-12 ENCOUNTER — Other Ambulatory Visit: Payer: Self-pay | Admitting: Cardiology

## 2019-09-27 DIAGNOSIS — I10 Essential (primary) hypertension: Secondary | ICD-10-CM | POA: Diagnosis not present

## 2019-09-27 DIAGNOSIS — I2510 Atherosclerotic heart disease of native coronary artery without angina pectoris: Secondary | ICD-10-CM | POA: Diagnosis not present

## 2019-09-27 DIAGNOSIS — E7849 Other hyperlipidemia: Secondary | ICD-10-CM | POA: Diagnosis not present

## 2019-10-28 DIAGNOSIS — I2510 Atherosclerotic heart disease of native coronary artery without angina pectoris: Secondary | ICD-10-CM | POA: Diagnosis not present

## 2019-10-28 DIAGNOSIS — E7849 Other hyperlipidemia: Secondary | ICD-10-CM | POA: Diagnosis not present

## 2019-10-28 DIAGNOSIS — I10 Essential (primary) hypertension: Secondary | ICD-10-CM | POA: Diagnosis not present

## 2019-12-12 DIAGNOSIS — Z8546 Personal history of malignant neoplasm of prostate: Secondary | ICD-10-CM | POA: Diagnosis not present

## 2019-12-19 DIAGNOSIS — N401 Enlarged prostate with lower urinary tract symptoms: Secondary | ICD-10-CM | POA: Diagnosis not present

## 2019-12-19 DIAGNOSIS — R3914 Feeling of incomplete bladder emptying: Secondary | ICD-10-CM | POA: Diagnosis not present

## 2019-12-19 DIAGNOSIS — Z8546 Personal history of malignant neoplasm of prostate: Secondary | ICD-10-CM | POA: Diagnosis not present

## 2019-12-19 DIAGNOSIS — N5201 Erectile dysfunction due to arterial insufficiency: Secondary | ICD-10-CM | POA: Diagnosis not present

## 2019-12-26 DIAGNOSIS — I10 Essential (primary) hypertension: Secondary | ICD-10-CM | POA: Diagnosis not present

## 2019-12-26 DIAGNOSIS — I2510 Atherosclerotic heart disease of native coronary artery without angina pectoris: Secondary | ICD-10-CM | POA: Diagnosis not present

## 2019-12-26 DIAGNOSIS — E7849 Other hyperlipidemia: Secondary | ICD-10-CM | POA: Diagnosis not present

## 2020-01-08 DIAGNOSIS — Z1389 Encounter for screening for other disorder: Secondary | ICD-10-CM | POA: Diagnosis not present

## 2020-01-08 DIAGNOSIS — E663 Overweight: Secondary | ICD-10-CM | POA: Diagnosis not present

## 2020-01-08 DIAGNOSIS — Z0000 Encounter for general adult medical examination without abnormal findings: Secondary | ICD-10-CM | POA: Diagnosis not present

## 2020-01-08 DIAGNOSIS — Z6825 Body mass index (BMI) 25.0-25.9, adult: Secondary | ICD-10-CM | POA: Diagnosis not present

## 2020-01-09 ENCOUNTER — Other Ambulatory Visit: Payer: Self-pay | Admitting: Cardiology

## 2020-01-09 NOTE — Telephone Encounter (Signed)
*  STAT* If patient is at the pharmacy, call can be transferred to refill team.   1. Which medications need to be refilled? (please list name of each medication and dose if known)  furosemide (LASIX) 20 MG tablet carvedilol (COREG) 6.25 MG tablet spironolactone (ALDACTONE) 25 MG tablet   2. Which pharmacy/location (including street and city if local pharmacy) is medication to be sent to? [facility].  This is patient's new pharmacy.   3. Do they need a 30 day or 90 day supply? 30 days

## 2020-01-10 MED ORDER — FUROSEMIDE 20 MG PO TABS: 20.0000 mg | ORAL_TABLET | Freq: Every day | ORAL | 1 refills | Status: AC

## 2020-01-10 MED ORDER — CARVEDILOL 6.25 MG PO TABS: 6.2500 mg | ORAL_TABLET | Freq: Two times a day (BID) | ORAL | 1 refills | Status: DC

## 2020-01-10 MED ORDER — SPIRONOLACTONE 25 MG PO TABS: 12.5000 mg | ORAL_TABLET | Freq: Every day | ORAL | 1 refills | Status: DC

## 2020-01-10 NOTE — Telephone Encounter (Signed)
Tyler Chavez from [facility] following up. Patient is out of medication.

## 2020-01-25 DIAGNOSIS — Z87891 Personal history of nicotine dependence: Secondary | ICD-10-CM | POA: Diagnosis not present

## 2020-01-25 DIAGNOSIS — E7849 Other hyperlipidemia: Secondary | ICD-10-CM | POA: Diagnosis not present

## 2020-01-25 DIAGNOSIS — I10 Essential (primary) hypertension: Secondary | ICD-10-CM | POA: Diagnosis not present

## 2020-01-25 DIAGNOSIS — I2510 Atherosclerotic heart disease of native coronary artery without angina pectoris: Secondary | ICD-10-CM | POA: Diagnosis not present

## 2020-03-26 DIAGNOSIS — Z87891 Personal history of nicotine dependence: Secondary | ICD-10-CM | POA: Diagnosis not present

## 2020-03-26 DIAGNOSIS — I10 Essential (primary) hypertension: Secondary | ICD-10-CM | POA: Diagnosis not present

## 2020-03-26 DIAGNOSIS — I2510 Atherosclerotic heart disease of native coronary artery without angina pectoris: Secondary | ICD-10-CM | POA: Diagnosis not present

## 2020-03-26 DIAGNOSIS — E7849 Other hyperlipidemia: Secondary | ICD-10-CM | POA: Diagnosis not present

## 2020-04-01 ENCOUNTER — Other Ambulatory Visit: Payer: Self-pay | Admitting: Cardiology

## 2020-04-25 DIAGNOSIS — Z87891 Personal history of nicotine dependence: Secondary | ICD-10-CM | POA: Diagnosis not present

## 2020-04-25 DIAGNOSIS — E7849 Other hyperlipidemia: Secondary | ICD-10-CM | POA: Diagnosis not present

## 2020-04-25 DIAGNOSIS — I2510 Atherosclerotic heart disease of native coronary artery without angina pectoris: Secondary | ICD-10-CM | POA: Diagnosis not present

## 2020-04-25 DIAGNOSIS — I10 Essential (primary) hypertension: Secondary | ICD-10-CM | POA: Diagnosis not present

## 2020-05-27 DIAGNOSIS — I2510 Atherosclerotic heart disease of native coronary artery without angina pectoris: Secondary | ICD-10-CM | POA: Diagnosis not present

## 2020-05-27 DIAGNOSIS — I10 Essential (primary) hypertension: Secondary | ICD-10-CM | POA: Diagnosis not present

## 2020-05-27 DIAGNOSIS — E7849 Other hyperlipidemia: Secondary | ICD-10-CM | POA: Diagnosis not present

## 2020-05-27 DIAGNOSIS — Z87891 Personal history of nicotine dependence: Secondary | ICD-10-CM | POA: Diagnosis not present

## 2020-06-05 ENCOUNTER — Other Ambulatory Visit: Payer: Self-pay | Admitting: Cardiology

## 2020-06-11 ENCOUNTER — Other Ambulatory Visit: Payer: Self-pay | Admitting: Cardiology

## 2020-06-11 MED ORDER — ATORVASTATIN CALCIUM 80 MG PO TABS: 80.0000 mg | ORAL_TABLET | Freq: Every day | ORAL | 0 refills | Status: DC

## 2020-06-11 MED ORDER — CARVEDILOL 6.25 MG PO TABS: 6.2500 mg | ORAL_TABLET | Freq: Two times a day (BID) | ORAL | 1 refills | Status: DC

## 2020-06-11 NOTE — Telephone Encounter (Signed)
Refill request for Coreg 6.25 mg once daily and Lipitor 80 mg daily approved and sent to [facility].  Georgana Curio MHA RN CCM

## 2020-06-11 NOTE — Telephone Encounter (Signed)
*  STAT* If patient is at the pharmacy, call can be transferred to refill team.   1. Which medications need to be refilled? (please list name of each medication and dose if known) carvedilol (COREG) 6.25 MG tablet atorvastatin (LIPITOR) 80 MG tablet 2. Which pharmacy/location (including street and city if local pharmacy) is medication to be sent to? [facility] - [place], [place] - [place] [address]  3. Do they need a 30 day or 90 day supply? 90 day supply

## 2020-06-12 DIAGNOSIS — Z8546 Personal history of malignant neoplasm of prostate: Secondary | ICD-10-CM | POA: Diagnosis not present

## 2020-06-17 ENCOUNTER — Other Ambulatory Visit: Payer: Self-pay | Admitting: Cardiology

## 2020-06-25 DIAGNOSIS — R3912 Poor urinary stream: Secondary | ICD-10-CM | POA: Diagnosis not present

## 2020-06-25 DIAGNOSIS — Z8546 Personal history of malignant neoplasm of prostate: Secondary | ICD-10-CM | POA: Diagnosis not present

## 2020-06-25 DIAGNOSIS — N401 Enlarged prostate with lower urinary tract symptoms: Secondary | ICD-10-CM | POA: Diagnosis not present

## 2020-06-25 DIAGNOSIS — N5201 Erectile dysfunction due to arterial insufficiency: Secondary | ICD-10-CM | POA: Diagnosis not present

## 2020-06-26 DIAGNOSIS — I10 Essential (primary) hypertension: Secondary | ICD-10-CM | POA: Diagnosis not present

## 2020-06-26 DIAGNOSIS — I2510 Atherosclerotic heart disease of native coronary artery without angina pectoris: Secondary | ICD-10-CM | POA: Diagnosis not present

## 2020-06-26 DIAGNOSIS — E7849 Other hyperlipidemia: Secondary | ICD-10-CM | POA: Diagnosis not present

## 2020-07-21 DIAGNOSIS — I255 Ischemic cardiomyopathy: Secondary | ICD-10-CM | POA: Insufficient documentation

## 2020-07-21 NOTE — Progress Notes (Signed)
Cardiology Office Note   Date:  07/23/2020   ID:  Tyler Chavez, DOB March 19, 1951, MRN [id number]  PCP:  Redmond School, MD  Cardiologist:   Shelva Majestic, MD   Chief Complaint  Patient presents with  . Cardiomyopathy      History of Present Illness: Tyler Chavez is a 69 y.o. male who presents for follow up of CAD with anterior STEMI found at outside hospital on 10/06/2016. He was urgently transferred to [facility] for emergent cardiac catheterization which revealed total occlusion of the LAD at the ostium, 75% proximal circumflex stenosis, 95% proximal RCA stenosis, and 95% PLA stenosis. He underwent PCI to his LAD and DES insertion. The procedure was complicated by hypotension and cardiogenic shock requiring dopamine and fluid resuscitation. 7 days later he underwent staged PCI to his PLA and proximal RCA. EF improved to 35%. Heart failure medications have been titrated including Entresto. He was initially fitted with a LifeVest..  He had an echocardiogram on 04/05/2017 with improvem ent of his LVEF to 40 to 45%.   Since I last saw him he has done well from a cardiovascular standpoint.  His father-in-law died last week.  He was elderly and got septic.  Tragically his son died yesterday from an unclear cause although he had a history of asthma.  The patient himself denies any cardiovascular symptoms.  He still works part-time at a job unloading trucks and doing some dishes. The patient denies any new symptoms such as chest discomfort, neck or arm discomfort. There has been no new shortness of breath, PND or orthopnea. There have been no reported palpitations, presyncope or syncope.  He gets none of the burning discomfort that he had at the time of his previous presentation.    Past Medical History:  Diagnosis Date  . Coronary artery disease    a. 09/2016: Anterior STEMI with 100% Ost LAD stenosis, 95% Post Atrio stenosis, 90% Prox RCA stenosis, and 75% Ost to Prox Cx stenosis. DES to  LAD. Staged DES to Post Atrio and DES to RCA performed later in admission.  . ED (erectile dysfunction)   . Frequency of urination   . GERD (gastroesophageal reflux disease)   . Hyperlipidemia   . Hypertension   . Ischemic cardiomyopathy    a. 09/2016: echo showing EF of 30-35% with Grade 2 DD  . Myocardial infarction ([place])   . Pneumonia   . Prostate cancer [facility]) urologist- dr wrenn/  oncologist-  dr Tammi Klippel   Stage T1c, Gleason 3+3, PSA 8.2, vol 49.9grams  . Rash    lower right leg  . Wears glasses     Past Surgical History:  Procedure Laterality Date  . CARDIAC CATHETERIZATION N/A 10/07/2016   Procedure: Left Heart Cath and Coronary Angiography;  Surgeon: Troy Sine, MD;  Location: [place] [facility];  Service: Cardiovascular;  Laterality: N/A;  . CARDIAC CATHETERIZATION N/A 10/07/2016   Procedure: Coronary Stent Intervention;  Surgeon: Troy Sine, MD;  Location: [place] [facility];  Service: Cardiovascular;  Laterality: N/A;  . CARDIAC CATHETERIZATION N/A 10/11/2016   Procedure: Coronary Stent Intervention;  Surgeon: Peter M Martinique, MD;  Location: [place] [facility];  Service: Cardiovascular;  Laterality: N/A;  . CYSTOSCOPY  02/19/2016   Procedure: CYSTOSCOPY;  Surgeon: Irine Seal, MD;  Location: [facility];  Service: Urology;;  no seeds visualized in bladder  . NO PAST SURGERIES    . RADIOACTIVE SEED IMPLANT N/A 02/19/2016   Procedure: RADIOACTIVE  SEED IMPLANT/BRACHYTHERAPY IMPLANT;  Surgeon: Irine Seal, MD;  Location: [facility];  Service: Urology;  Laterality: N/A;  78   seeds implanted     Current Outpatient Medications  Medication Sig Dispense Refill  . aspirin EC 81 MG tablet Take 81 mg by mouth daily.    [name] atorvastatin (LIPITOR) 80 MG tablet Take 1 tablet (80 mg total) by mouth daily. 90 tablet 0  . calcium carbonate (TUMS - DOSED IN MG ELEMENTAL CALCIUM) 500 MG chewable tablet Chew 1 tablet by mouth as needed for indigestion or  heartburn.    . carvedilol (COREG) 12.5 MG tablet Take 1 tablet (12.5 mg total) by mouth 2 (two) times daily with a meal. 180 tablet 3  . ENTRESTO 97-103 MG TAKE ONE TABLET BY MOUTH TWICE DAILY 180 tablet 0  . furosemide (LASIX) 20 MG tablet Take 1 tablet (20 mg total) by mouth daily. 90 tablet 1  . Multiple Vitamin (MULTIVITAMIN) tablet Take 1 tablet by mouth daily.    [name] PROAIR HFA 108 (90 Base) MCG/ACT inhaler Inhale 2 puffs into the lungs as needed.    . sildenafil (REVATIO) 20 MG tablet Take 20 mg by mouth daily.    [name] spironolactone (ALDACTONE) 25 MG tablet TAKE 1/2 TABLET BY MOUTH EVERY DAY 15 tablet 0  . tamsulosin (FLOMAX) 0.4 MG CAPS capsule Take 1 capsule (0.4 mg total) by mouth daily. 30 capsule 1  . nitroGLYCERIN (NITROSTAT) 0.4 MG SL tablet Place 1 tablet (0.4 mg total) under the tongue every 5 (five) minutes as needed for chest pain. 25 tablet 4   No current facility-administered medications for this visit.    Allergies:   Patient has no known allergies.    ROS:  Please see the history of present illness.   Otherwise, review of systems are positive for none.   All other systems are reviewed and negative.    PHYSICAL EXAM: VS:  BP (!) 150/98   Pulse 87   Ht 6\' 2"  (1.88 m)   Wt 202 lb (91.6 kg)   BMI 25.94 kg/m  , BMI Body mass index is 25.94 kg/m. GENERAL:  Well appearing NECK:  No jugular venous distention, waveform within normal limits, carotid upstroke brisk and symmetric, no bruits, no thyromegaly LUNGS:  Clear to auscultation bilaterally CHEST:  Unremarkable HEART:  PMI not displaced or sustained,S1 and S2 within normal limits, no S3, no S4, no clicks, no rubs, no murmurs ABD:  Flat, positive bowel sounds normal in frequency in pitch, no bruits, no rebound, no guarding, no midline pulsatile mass, no hepatomegaly, no splenomegaly EXT:  2 plus pulses throughout, no edema, no cyanosis no clubbing   EKG:  EKG is ordered today. The ekg ordered today demonstrates  sinus rhythm, rate 87, left axis deviation, left anterior fascicular block, poor anterior R wave progression, probable old anteroseptal infarct, anterior T wave inversions unchanged from previous.   Recent Labs: No results found for requested labs within last 8760 hours.    Lipid Panel    Component Value Date/Time   CHOL 108 01/12/2017 0823   TRIG 69 01/12/2017 0823   HDL 41 01/12/2017 0823   CHOLHDL 2.6 01/12/2017 0823   VLDL 14 01/12/2017 0823   LDLCALC 53 01/12/2017 0823      Wt Readings from Last 3 Encounters:  07/23/20 202 lb (91.6 kg)  06/25/19 202 lb (91.6 kg)  12/22/18 192 lb (87.1 kg)      Other studies Reviewed: Additional studies/ records  that were reviewed today include: None Review of the above records demonstrates:  NA  ASSESSMENT AND PLAN:  CAD The patient has no new sypmtoms.  No further cardiovascular testing is indicated.  We will continue with aggressive risk reduction and meds as listed.  Ischemic cardiomyopathy/Chronic systolic heart failure ([place]) He is blood pressure will allow med titration so today I will go up 12 and half twice daily of his labetalol.   Hyperlipidemia, unspecified hyperlipidemia type I do not see lipids in a while but he says he had blood work and I will call his primary care office.  Previously last year was excellent and the goal LDL is less than 70.   Essential hypertension The blood pressure elevated but this is unusual.  Again this gives me a chance to uptitrate his medications. This is being managed in the context of treating his CHF   Current medicines are reviewed at length with the patient today.  The patient does not have concerns regarding medicines.  The following changes have been made: None  Labs/ tests ordered today include: None  Orders Placed This Encounter  Procedures  . EKG 12-Lead     Disposition:   FU with me in 6 months.    Signed, Minus Breeding, MD  07/23/2020 10:30 AM    [place]

## 2020-07-23 ENCOUNTER — Encounter: Payer: Self-pay | Admitting: Cardiology

## 2020-07-23 ENCOUNTER — Ambulatory Visit: Payer: PPO | Admitting: Cardiology

## 2020-07-23 ENCOUNTER — Other Ambulatory Visit: Payer: Self-pay

## 2020-07-23 VITALS — BP 150/98 | HR 87 | Ht 74.0 in | Wt 202.0 lb

## 2020-07-23 DIAGNOSIS — I10 Essential (primary) hypertension: Secondary | ICD-10-CM

## 2020-07-23 DIAGNOSIS — E785 Hyperlipidemia, unspecified: Secondary | ICD-10-CM | POA: Diagnosis not present

## 2020-07-23 DIAGNOSIS — I255 Ischemic cardiomyopathy: Secondary | ICD-10-CM

## 2020-07-23 DIAGNOSIS — I2510 Atherosclerotic heart disease of native coronary artery without angina pectoris: Secondary | ICD-10-CM | POA: Diagnosis not present

## 2020-07-23 MED ORDER — CARVEDILOL 12.5 MG PO TABS: 12.5000 mg | ORAL_TABLET | Freq: Two times a day (BID) | ORAL | 3 refills | Status: DC

## 2020-07-23 MED ORDER — NITROGLYCERIN 0.4 MG SL SUBL: 0.4000 mg | SUBLINGUAL_TABLET | SUBLINGUAL | 4 refills | Status: AC | PRN

## 2020-07-23 NOTE — Patient Instructions (Addendum)
Medication Instructions:  Please increase your Carvedilol to 12.5 mg twice daily.   Continue all other medications as listed. *If you need a refill on your cardiac medications before your next appointment, please call your pharmacy*  Follow-Up: At [facility], you and your health needs are our priority.  As part of our continuing mission to provide you with exceptional heart care, we have created designated Provider Care Teams.  These Care Teams include your primary Cardiologist (physician) and Advanced Practice Providers (APPs -  Physician Assistants and Nurse Practitioners) who all work together to provide you with the care you need, when you need it.  We recommend signing up for the patient portal called "MyChart".  Sign up information is provided on this After Visit Summary.  MyChart is used to connect with patients for Virtual Visits (Telemedicine).  Patients are able to view lab/test results, encounter notes, upcoming appointments, etc.  Non-urgent messages can be sent to your provider as well.   To learn more about what you can do with MyChart, go to [email or web address].    Your next appointment:   6 month(s)  The format for your next appointment:   In Person  Provider:   Minus Breeding, MD  Thank you for choosing [facility]!!    Nitroglycerin sublingual tablets What is this medicine? NITROGLYCERIN (nye troe GLI ser in) is a type of vasodilator. It relaxes blood vessels, increasing the blood and oxygen supply to your heart. This medicine is used to relieve chest pain caused by angina. It is also used to prevent chest pain before activities like climbing stairs, going outdoors in cold weather, or sexual activity. This medicine may be used for other purposes; ask your health care provider or pharmacist if you have questions. COMMON BRAND NAME(S): Nitroquick, Nitrostat, Nitrotab What should I tell my health care provider before I take this medicine? They need to  know if you have any of these conditions:  anemia  head injury, recent stroke, or bleeding in the brain  liver disease  previous heart attack  an unusual or allergic reaction to nitroglycerin, other medicines, foods, dyes, or preservatives  pregnant or trying to get pregnant  breast-feeding How should I use this medicine? Take this medicine by mouth as needed. At the first sign of an angina attack (chest pain or tightness) place one tablet under your tongue. You can also take this medicine 5 to 10 minutes before an event likely to produce chest pain. Follow the directions on the prescription label. Let the tablet dissolve under the tongue. Do not swallow whole. Replace the dose if you accidentally swallow it. It will help if your mouth is not dry. Saliva around the tablet will help it to dissolve more quickly. Do not eat or drink, smoke or chew tobacco while a tablet is dissolving. If you are not better within 5 minutes after taking ONE dose of nitroglycerin, call 9-1-1 immediately to seek emergency medical care. Do not take more than 3 nitroglycerin tablets over 15 minutes. If you take this medicine often to relieve symptoms of angina, your doctor or health care professional may provide you with different instructions to manage your symptoms. If symptoms do not go away after following these instructions, it is important to call 9-1-1 immediately. Do not take more than 3 nitroglycerin tablets over 15 minutes. Talk to your pediatrician regarding the use of this medicine in children. Special care may be needed. Overdosage: If you think you have taken too much of this  medicine contact a poison control center or emergency room at once. NOTE: This medicine is only for you. Do not share this medicine with others. What if I miss a dose? This does not apply. This medicine is only used as needed. What may interact with this medicine? Do not take this medicine with any of the following  medications:  certain migraine medicines like ergotamine and dihydroergotamine (DHE)  medicines used to treat erectile dysfunction like sildenafil, tadalafil, and vardenafil  riociguat This medicine may also interact with the following medications:  alteplase  aspirin  heparin  medicines for high blood pressure  medicines for mental depression  other medicines used to treat angina  phenothiazines like chlorpromazine, mesoridazine, prochlorperazine, thioridazine This list may not describe all possible interactions. Give your health care provider a list of all the medicines, herbs, non-prescription drugs, or dietary supplements you use. Also tell them if you smoke, drink alcohol, or use illegal drugs. Some items may interact with your medicine. What should I watch for while using this medicine? Tell your doctor or health care professional if you feel your medicine is no longer working. Keep this medicine with you at all times. Sit or lie down when you take your medicine to prevent falling if you feel dizzy or faint after using it. Try to remain calm. This will help you to feel better faster. If you feel dizzy, take several deep breaths and lie down with your feet propped up, or bend forward with your head resting between your knees. You may get drowsy or dizzy. Do not drive, use machinery, or do anything that needs mental alertness until you know how this drug affects you. Do not stand or sit up quickly, especially if you are an older patient. This reduces the risk of dizzy or fainting spells. Alcohol can make you more drowsy and dizzy. Avoid alcoholic drinks. Do not treat yourself for coughs, colds, or pain while you are taking this medicine without asking your doctor or health care professional for advice. Some ingredients may increase your blood pressure. What side effects may I notice from receiving this medicine? Side effects that you should report to your doctor or health care  professional as soon as possible:  blurred vision  dry mouth  skin rash  sweating  the feeling of extreme pressure in the head  unusually weak or tired Side effects that usually do not require medical attention (report to your doctor or health care professional if they continue or are bothersome):  flushing of the face or neck  headache  irregular heartbeat, palpitations  nausea, vomiting This list may not describe all possible side effects. Call your doctor for medical advice about side effects. You may report side effects to FDA at 1-800-FDA-1088. Where should I keep my medicine? Keep out of the reach of children. Store at room temperature between 20 and 25 degrees C (68 and 77 degrees F). Store in [facility] [employer]. Protect from light and moisture. Keep tightly closed. Throw away any unused medicine after the expiration date. NOTE: This sheet is a summary. It may not cover all possible information. If you have questions about this medicine, talk to your doctor, pharmacist, or health care provider.  2020 Elsevier/Gold Standard (2013-07-12 17:57:36)

## 2020-07-26 DIAGNOSIS — E7849 Other hyperlipidemia: Secondary | ICD-10-CM | POA: Diagnosis not present

## 2020-07-26 DIAGNOSIS — I10 Essential (primary) hypertension: Secondary | ICD-10-CM | POA: Diagnosis not present

## 2020-07-26 DIAGNOSIS — I2510 Atherosclerotic heart disease of native coronary artery without angina pectoris: Secondary | ICD-10-CM | POA: Diagnosis not present

## 2020-08-20 ENCOUNTER — Telehealth: Payer: Self-pay | Admitting: Cardiology

## 2020-08-20 NOTE — Telephone Encounter (Signed)
Patient calling the office for samples of medication:   1.  What medication and dosage are you requesting samples for?  ENTRESTO 97-103 MG   2.  Are you currently out of this medication? No  Requesting a detailed VM if she is unable to answer.

## 2020-08-20 NOTE — Telephone Encounter (Signed)
Spoke with patient. Unfortunately we do not carry samples of Entresto 97-103. Patient verbalized understanding.

## 2020-09-04 ENCOUNTER — Other Ambulatory Visit: Payer: Self-pay | Admitting: Cardiology

## 2020-09-05 ENCOUNTER — Other Ambulatory Visit: Payer: Self-pay | Admitting: Cardiology

## 2020-09-26 DIAGNOSIS — E7849 Other hyperlipidemia: Secondary | ICD-10-CM | POA: Diagnosis not present

## 2020-09-26 DIAGNOSIS — I2510 Atherosclerotic heart disease of native coronary artery without angina pectoris: Secondary | ICD-10-CM | POA: Diagnosis not present

## 2020-09-26 DIAGNOSIS — I10 Essential (primary) hypertension: Secondary | ICD-10-CM | POA: Diagnosis not present

## 2020-10-18 DIAGNOSIS — J439 Emphysema, unspecified: Secondary | ICD-10-CM | POA: Diagnosis not present

## 2020-10-18 DIAGNOSIS — I452 Bifascicular block: Secondary | ICD-10-CM | POA: Diagnosis not present

## 2020-10-18 DIAGNOSIS — I110 Hypertensive heart disease with heart failure: Secondary | ICD-10-CM | POA: Diagnosis not present

## 2020-10-18 DIAGNOSIS — R079 Chest pain, unspecified: Secondary | ICD-10-CM | POA: Diagnosis not present

## 2020-10-18 DIAGNOSIS — I252 Old myocardial infarction: Secondary | ICD-10-CM | POA: Diagnosis not present

## 2020-10-18 DIAGNOSIS — E785 Hyperlipidemia, unspecified: Secondary | ICD-10-CM | POA: Diagnosis not present

## 2020-10-18 DIAGNOSIS — I10 Essential (primary) hypertension: Secondary | ICD-10-CM | POA: Diagnosis not present

## 2020-10-18 DIAGNOSIS — I509 Heart failure, unspecified: Secondary | ICD-10-CM | POA: Diagnosis not present

## 2020-10-18 DIAGNOSIS — Z87891 Personal history of nicotine dependence: Secondary | ICD-10-CM | POA: Diagnosis not present

## 2020-10-18 DIAGNOSIS — R9431 Abnormal electrocardiogram [ECG] [EKG]: Secondary | ICD-10-CM | POA: Diagnosis not present

## 2020-10-27 DIAGNOSIS — I10 Essential (primary) hypertension: Secondary | ICD-10-CM | POA: Diagnosis not present

## 2020-10-27 DIAGNOSIS — E782 Mixed hyperlipidemia: Secondary | ICD-10-CM | POA: Diagnosis not present

## 2020-10-27 DIAGNOSIS — I2510 Atherosclerotic heart disease of native coronary artery without angina pectoris: Secondary | ICD-10-CM | POA: Diagnosis not present

## 2020-11-13 ENCOUNTER — Telehealth: Payer: Self-pay | Admitting: Cardiology

## 2020-11-13 DIAGNOSIS — I509 Heart failure, unspecified: Secondary | ICD-10-CM | POA: Diagnosis not present

## 2020-11-13 DIAGNOSIS — R7309 Other abnormal glucose: Secondary | ICD-10-CM | POA: Diagnosis not present

## 2020-11-13 DIAGNOSIS — Z6825 Body mass index (BMI) 25.0-25.9, adult: Secondary | ICD-10-CM | POA: Diagnosis not present

## 2020-11-13 DIAGNOSIS — E663 Overweight: Secondary | ICD-10-CM | POA: Diagnosis not present

## 2020-11-13 NOTE — Telephone Encounter (Signed)
    Tyler Chavez with [facility] calling, she said they need to verify pt's diagnosis for CHF

## 2020-11-13 NOTE — Telephone Encounter (Signed)
Confirmed with Caryl Pina: Ischemic cardiomyopathy/Chronic systolic heart failure [facility]) is listed as an active diagnosis on patient's profile. Understanding verbalized.

## 2020-11-21 ENCOUNTER — Other Ambulatory Visit: Payer: Self-pay

## 2020-11-21 MED ORDER — SPIRONOLACTONE 25 MG PO TABS: 12.5000 mg | ORAL_TABLET | Freq: Every day | ORAL | 2 refills | Status: DC

## 2020-12-25 ENCOUNTER — Telehealth: Payer: Self-pay | Admitting: Cardiology

## 2020-12-25 NOTE — Telephone Encounter (Signed)
*  STAT* If patient is at the pharmacy, call can be transferred to refill team.   1. Which medications need to be refilled? (please list name of each medication and dose if known)   atorvastatin (LIPITOR) 80 MG tablet [[id number]]  2. Which pharmacy/location (including street and city if local pharmacy) is medication to be sent to?  [id number] - [place], [place]  [address] [place], [place] [address]  Phone:  [phone] Fax:  [phone]   3. Do they need a 30 day or 90 day supply? 87  Per Health team RN pt has not been taking this med , pt needs a refill and needs to know if he is suppose to be taking this    Health team RN number if needed  Wilson Manuela Schwartz

## 2020-12-26 DIAGNOSIS — Z8546 Personal history of malignant neoplasm of prostate: Secondary | ICD-10-CM | POA: Diagnosis not present

## 2020-12-29 MED ORDER — ATORVASTATIN CALCIUM 80 MG PO TABS: 80.0000 mg | ORAL_TABLET | Freq: Every day | ORAL | 1 refills | Status: DC

## 2021-01-05 DIAGNOSIS — N5201 Erectile dysfunction due to arterial insufficiency: Secondary | ICD-10-CM | POA: Diagnosis not present

## 2021-01-05 DIAGNOSIS — R3912 Poor urinary stream: Secondary | ICD-10-CM | POA: Diagnosis not present

## 2021-01-05 DIAGNOSIS — N401 Enlarged prostate with lower urinary tract symptoms: Secondary | ICD-10-CM | POA: Diagnosis not present

## 2021-01-05 DIAGNOSIS — Z8546 Personal history of malignant neoplasm of prostate: Secondary | ICD-10-CM | POA: Diagnosis not present

## 2021-01-13 ENCOUNTER — Encounter: Payer: Self-pay | Admitting: Cardiology

## 2021-02-04 DIAGNOSIS — E7849 Other hyperlipidemia: Secondary | ICD-10-CM | POA: Diagnosis not present

## 2021-02-04 DIAGNOSIS — E663 Overweight: Secondary | ICD-10-CM | POA: Diagnosis not present

## 2021-02-04 DIAGNOSIS — Z1389 Encounter for screening for other disorder: Secondary | ICD-10-CM | POA: Diagnosis not present

## 2021-02-04 DIAGNOSIS — Z1331 Encounter for screening for depression: Secondary | ICD-10-CM | POA: Diagnosis not present

## 2021-02-04 DIAGNOSIS — Z0000 Encounter for general adult medical examination without abnormal findings: Secondary | ICD-10-CM | POA: Diagnosis not present

## 2021-02-04 DIAGNOSIS — Z0001 Encounter for general adult medical examination with abnormal findings: Secondary | ICD-10-CM | POA: Diagnosis not present

## 2021-02-04 DIAGNOSIS — S46002A Unspecified injury of muscle(s) and tendon(s) of the rotator cuff of left shoulder, initial encounter: Secondary | ICD-10-CM | POA: Diagnosis not present

## 2021-02-04 DIAGNOSIS — Z6825 Body mass index (BMI) 25.0-25.9, adult: Secondary | ICD-10-CM | POA: Diagnosis not present

## 2021-02-17 NOTE — Progress Notes (Signed)
Cardiology Office Note   Date:  02/18/2021   ID:  Tyler Chavez, DOB [birth year], MRN [id number]  PCP:  Redmond School, MD  Cardiologist:   Minus Breeding, MD   Chief Complaint  Patient presents with  . Coronary Artery Disease      History of Present Illness: Tyler Chavez is a 70 y.o. male who presents for follow up of CAD with anterior STEMI found at outside hospital on 10/06/2016. He was urgently transferred to [facility] for emergent cardiac catheterization which revealed total occlusion of the LAD at the ostium, 75% proximal circumflex stenosis, 95% proximal RCA stenosis, and 95% PLA stenosis. He underwent PCI to his LAD and DES insertion. The procedure was complicated by hypotension and cardiogenic shock requiring dopamine and fluid resuscitation. 7 days later he underwent staged PCI to his PLA and proximal RCA. EF improved to 35%. Heart failure medications have been titrated including Entresto. He was initially fitted with a LifeVest..  He had an echocardiogram on 04/05/2017 with improvem ent of his LVEF to 40 to 45%.   Since I last saw him he has done well.  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 The patient denies any new symptoms such as chest discomfort, neck or arm discomfort. There has been no new shortness of breath, PND or orthopnea. There have been no reported palpitations, presyncope or syncope.   He did have some chest discomfort apparently in January and I discovered this on Care Everywhere that he was at [facility] for this.  This was not thought to be angina and enzymes were negative.  I did review these records.   Past Medical History:  Diagnosis Date  . Coronary artery disease    a. 09/2016: Anterior STEMI with 100% Ost LAD stenosis, 95% Post Atrio stenosis, 90% Prox RCA stenosis, and 75% Ost to Prox Cx stenosis. DES to LAD. Staged DES to Post Atrio and DES to RCA performed later in admission.  . ED (erectile dysfunction)   .  Frequency of urination   . GERD (gastroesophageal reflux disease)   . Hyperlipidemia   . Hypertension   . Ischemic cardiomyopathy    a. 09/2016: echo showing EF of 30-35% with Grade 2 DD  . Myocardial infarction ([place])   . Prostate cancer [facility]) urologist- dr wrenn/  oncologist-  dr Tammi Klippel   Stage T1c, Gleason 3+3, PSA 8.2, vol 49.9grams  . Rash    lower right leg    Past Surgical History:  Procedure Laterality Date  . CARDIAC CATHETERIZATION N/A 10/07/2016   Procedure: Left Heart Cath and Coronary Angiography;  Surgeon: Troy Sine, MD;  Location: [place] [facility];  Service: Cardiovascular;  Laterality: N/A;  . CARDIAC CATHETERIZATION N/A 10/07/2016   Procedure: Coronary Stent Intervention;  Surgeon: Troy Sine, MD;  Location: [place] [facility];  Service: Cardiovascular;  Laterality: N/A;  . CARDIAC CATHETERIZATION N/A 10/11/2016   Procedure: Coronary Stent Intervention;  Surgeon: Peter M Martinique, MD;  Location: [place] [facility];  Service: Cardiovascular;  Laterality: N/A;  . CYSTOSCOPY  02/19/2016   Procedure: CYSTOSCOPY;  Surgeon: Irine Seal, MD;  Location: [facility];  Service: Urology;;  no seeds visualized in bladder  . NO PAST SURGERIES    . RADIOACTIVE SEED IMPLANT N/A 02/19/2016   Procedure: RADIOACTIVE SEED IMPLANT/BRACHYTHERAPY IMPLANT;  Surgeon: Irine Seal, MD;  Location: [facility];  Service: Urology;  Laterality: N/A;  79  seeds implanted     Current Outpatient Medications  Medication Sig Dispense Refill  . aspirin EC 81 MG tablet Take 81 mg by mouth daily.    [name] atorvastatin (LIPITOR) 80 MG tablet Take 1 tablet (80 mg total) by mouth daily. 90 tablet 1  . calcium carbonate (TUMS - DOSED IN MG ELEMENTAL CALCIUM) 500 MG chewable tablet Chew 1 tablet by mouth as needed for indigestion or heartburn.    . carvedilol (COREG) 25 MG tablet Take 1 tablet (25 mg total) by mouth 2 (two) times daily. 180 tablet 3  . ENTRESTO 97-103 MG TAKE 1  TABLET TWICE DAILY 60 tablet 5  . furosemide (LASIX) 20 MG tablet Take 1 tablet (20 mg total) by mouth daily. (Patient taking differently: Take 20 mg by mouth daily as needed.) 90 tablet 1  . Multiple Vitamin (MULTIVITAMIN) tablet Take 1 tablet by mouth daily.    . nitroGLYCERIN (NITROSTAT) 0.4 MG SL tablet Place 1 tablet (0.4 mg total) under the tongue every 5 (five) minutes as needed for chest pain. 25 tablet 4  . PROAIR HFA 108 (90 Base) MCG/ACT inhaler Inhale 2 puffs into the lungs as needed.    [name] spironolactone (ALDACTONE) 25 MG tablet Take 0.5 tablets (12.5 mg total) by mouth daily. 45 tablet 2  . tamsulosin (FLOMAX) 0.4 MG CAPS capsule Take 1 capsule (0.4 mg total) by mouth daily. 30 capsule 1   No current facility-administered medications for this visit.    Allergies:   Patient has no known allergies.    ROS:  Please see the history of present illness.   Otherwise, review of systems are positive for none.   All other systems are reviewed and negative.    PHYSICAL EXAM: VS:  BP (!) 149/94   Pulse 75   Ht 6\' 2"  (1.88 m)   Wt 201 lb (91.2 kg)   BMI 25.81 kg/m  , BMI Body mass index is 25.81 kg/m. GENERAL:  Well appearing NECK:  No jugular venous distention, waveform within normal limits, carotid upstroke brisk and symmetric, no bruits, no thyromegaly LUNGS:  Clear to auscultation bilaterally CHEST:  Unremarkable HEART:  PMI not displaced or sustained,S1 and S2 within normal limits, no S3, no S4, no clicks, no rubs, no murmurs ABD:  Flat, positive bowel sounds normal in frequency in pitch, no bruits, no rebound, no guarding, no midline pulsatile mass, no hepatomegaly, no splenomegaly EXT:  2 plus pulses throughout, no edema, no cyanosis no clubbing   EKG:  EKG is not ordered today.    Recent Labs: No results found for requested labs within last 8760 hours.    Lipid Panel    Component Value Date/Time   CHOL 108 01/12/2017 0823   TRIG 69 01/12/2017 0823   HDL 41  01/12/2017 0823   CHOLHDL 2.6 01/12/2017 0823   VLDL 14 01/12/2017 0823   LDLCALC 53 01/12/2017 0823      Wt Readings from Last 3 Encounters:  02/18/21 201 lb (91.2 kg)  07/23/20 202 lb (91.6 kg)  06/25/19 202 lb (91.6 kg)      Other studies Reviewed: Additional studies/ records that were reviewed today include: Labs Review of the above records demonstrates: See elsewhere  ASSESSMENT AND PLAN:  CAD The patient has no new sypmtoms.  No further cardiovascular testing is indicated.  We will continue with aggressive risk reduction and meds as listed.ue with aggressive risk reduction and meds as listed.  Ischemic cardiomyopathy/Chronic systolic heart failure ([place]) I  am going to increase his carvedilol to 25 mg twice daily.  He can start to take the Lasix as needed.   Hyperlipidemia, unspecified hyperlipidemia type His goal LDL is less than 70.  I will try to get the records from his primary provider.   Essential hypertension This is being managed in the context of treating his CHF    Current medicines are reviewed at length with the patient today.  The patient does not have concerns regarding medicines.  The following changes have been made: None  Labs/ tests ordered today include: None  No orders of the defined types were placed in this encounter.    Disposition:   FU with me in 6 months.    Signed, Minus Breeding, MD  02/18/2021 2:22 PM    [facility]

## 2021-02-18 ENCOUNTER — Other Ambulatory Visit: Payer: Self-pay

## 2021-02-18 ENCOUNTER — Encounter: Payer: Self-pay | Admitting: Cardiology

## 2021-02-18 ENCOUNTER — Ambulatory Visit (INDEPENDENT_AMBULATORY_CARE_PROVIDER_SITE_OTHER): Payer: HMO | Admitting: Cardiology

## 2021-02-18 VITALS — BP 149/94 | HR 75 | Ht 74.0 in | Wt 201.0 lb

## 2021-02-18 DIAGNOSIS — E785 Hyperlipidemia, unspecified: Secondary | ICD-10-CM

## 2021-02-18 DIAGNOSIS — I10 Essential (primary) hypertension: Secondary | ICD-10-CM

## 2021-02-18 DIAGNOSIS — I255 Ischemic cardiomyopathy: Secondary | ICD-10-CM

## 2021-02-18 DIAGNOSIS — I25119 Atherosclerotic heart disease of native coronary artery with unspecified angina pectoris: Secondary | ICD-10-CM | POA: Diagnosis not present

## 2021-02-18 MED ORDER — CARVEDILOL 25 MG PO TABS: 25.0000 mg | ORAL_TABLET | Freq: Two times a day (BID) | ORAL | 3 refills | Status: DC

## 2021-02-18 NOTE — Patient Instructions (Signed)
Medication Instructions:  Please increase your Carvedilol to 25 mg twice a day. You may take your Furosemide as needed. Continue all other medications as listed.  *If you need a refill on your cardiac medications before your next appointment, please call your pharmacy*  Follow-Up: At [facility], you and your health needs are our priority.  As part of our continuing mission to provide you with exceptional heart care, we have created designated Provider Care Teams.  These Care Teams include your primary Cardiologist (physician) and Advanced Practice Providers (APPs -  Physician Assistants and Nurse Practitioners) who all work together to provide you with the care you need, when you need it.  We recommend signing up for the patient portal called "MyChart".  Sign up information is provided on this After Visit Summary.  MyChart is used to connect with patients for Virtual Visits (Telemedicine).  Patients are able to view lab/test results, encounter notes, upcoming appointments, etc.  Non-urgent messages can be sent to your provider as well.   To learn more about what you can do with MyChart, go to [email or web address].    Your next appointment:   1 year(s)  The format for your next appointment:   In Person  Provider:   Minus Breeding, MD   Thank you for choosing [facility]!!

## 2021-02-24 ENCOUNTER — Other Ambulatory Visit: Payer: Self-pay | Admitting: Cardiology

## 2021-03-17 DIAGNOSIS — I2510 Atherosclerotic heart disease of native coronary artery without angina pectoris: Secondary | ICD-10-CM | POA: Diagnosis not present

## 2021-03-17 DIAGNOSIS — R9431 Abnormal electrocardiogram [ECG] [EKG]: Secondary | ICD-10-CM | POA: Diagnosis not present

## 2021-03-17 DIAGNOSIS — Z20822 Contact with and (suspected) exposure to covid-19: Secondary | ICD-10-CM | POA: Diagnosis not present

## 2021-03-17 DIAGNOSIS — R0602 Shortness of breath: Secondary | ICD-10-CM | POA: Diagnosis not present

## 2021-03-17 DIAGNOSIS — R059 Cough, unspecified: Secondary | ICD-10-CM | POA: Diagnosis not present

## 2021-03-17 DIAGNOSIS — I452 Bifascicular block: Secondary | ICD-10-CM | POA: Diagnosis not present

## 2021-03-17 DIAGNOSIS — Z79899 Other long term (current) drug therapy: Secondary | ICD-10-CM | POA: Diagnosis not present

## 2021-03-17 DIAGNOSIS — F17210 Nicotine dependence, cigarettes, uncomplicated: Secondary | ICD-10-CM | POA: Diagnosis not present

## 2021-03-19 ENCOUNTER — Other Ambulatory Visit: Payer: Self-pay

## 2021-03-19 ENCOUNTER — Encounter (HOSPITAL_COMMUNITY): Payer: Self-pay

## 2021-03-19 DIAGNOSIS — I10 Essential (primary) hypertension: Secondary | ICD-10-CM | POA: Insufficient documentation

## 2021-03-19 DIAGNOSIS — J449 Chronic obstructive pulmonary disease, unspecified: Secondary | ICD-10-CM | POA: Diagnosis not present

## 2021-03-19 DIAGNOSIS — Z7982 Long term (current) use of aspirin: Secondary | ICD-10-CM | POA: Diagnosis not present

## 2021-03-19 DIAGNOSIS — J209 Acute bronchitis, unspecified: Secondary | ICD-10-CM | POA: Insufficient documentation

## 2021-03-19 DIAGNOSIS — Z8546 Personal history of malignant neoplasm of prostate: Secondary | ICD-10-CM | POA: Insufficient documentation

## 2021-03-19 DIAGNOSIS — I2510 Atherosclerotic heart disease of native coronary artery without angina pectoris: Secondary | ICD-10-CM | POA: Diagnosis not present

## 2021-03-19 DIAGNOSIS — Z79899 Other long term (current) drug therapy: Secondary | ICD-10-CM | POA: Insufficient documentation

## 2021-03-19 DIAGNOSIS — R059 Cough, unspecified: Secondary | ICD-10-CM | POA: Diagnosis not present

## 2021-03-19 DIAGNOSIS — R0602 Shortness of breath: Secondary | ICD-10-CM | POA: Diagnosis present

## 2021-03-19 DIAGNOSIS — Z87891 Personal history of nicotine dependence: Secondary | ICD-10-CM | POA: Diagnosis not present

## 2021-03-19 NOTE — ED Triage Notes (Signed)
Pt from home, reports shortness of breath and cough that started two days ago. Pt does not appear sob, no resp distress, can speak full sentences. Ambulatory without obvious signs of difficulty breathing.

## 2021-03-20 ENCOUNTER — Emergency Department (HOSPITAL_COMMUNITY)
Admission: EM | Admit: 2021-03-20 | Discharge: 2021-03-20 | Disposition: A | Discharge status: Home / Self Care | Payer: HMO | Attending: Emergency Medicine | Admitting: Emergency Medicine

## 2021-03-20 ENCOUNTER — Emergency Department (HOSPITAL_COMMUNITY): Payer: HMO

## 2021-03-20 DIAGNOSIS — R059 Cough, unspecified: Secondary | ICD-10-CM | POA: Diagnosis not present

## 2021-03-20 DIAGNOSIS — J209 Acute bronchitis, unspecified: Secondary | ICD-10-CM

## 2021-03-20 DIAGNOSIS — J449 Chronic obstructive pulmonary disease, unspecified: Secondary | ICD-10-CM | POA: Diagnosis not present

## 2021-03-20 MED ORDER — IPRATROPIUM-ALBUTEROL 0.5-2.5 (3) MG/3ML IN SOLN
3.0000 mL | Freq: Once | RESPIRATORY_TRACT | Status: AC
Start: 2021-03-20 — End: 2021-03-20
  Administered 2021-03-20: 3 mL via RESPIRATORY_TRACT
  Filled 2021-03-20: qty 3

## 2021-03-20 MED ORDER — PREDNISONE 50 MG PO TABS: 50.0000 mg | ORAL_TABLET | Freq: Every day | ORAL | 0 refills | Status: DC

## 2021-03-20 MED ORDER — PREDNISONE 50 MG PO TABS
60.0000 mg | ORAL_TABLET | Freq: Once | ORAL | Status: AC
  Administered 2021-03-20: 60 mg via ORAL
  Filled 2021-03-20: qty 1

## 2021-03-20 NOTE — Discharge Instructions (Addendum)
Continue using the inhaler as needed.  Continue taking the antibiotic until it is completed.  Return to the emergency department if you are having any problems.

## 2021-03-20 NOTE — ED Provider Notes (Signed)
[facility] EMERGENCY DEPARTMENT Provider Note   CSN: [id number] Arrival date & time: 03/19/21  2226     History Chief Complaint  Patient presents with   Shortness of Breath    cough    Tyler Chavez is a 70 y.o. male.  The history is provided by the patient.  Shortness of Breath He has history of hypertension, hyperlipidemia, coronary artery disease, prostate cancer and comes in complaining of cough and shortness of breath.  He tells me cough has been present for about 1-2days.  It is mainly nonproductive, but he does occasionally bring up some green sputum.  He denies fever, chills, sweats.  Dyspnea is actually only present during coughing paroxysms.  He denies chest pain and denies arthralgias or myalgias.  He denies any sick contacts.  When I informed him that his old records showed that he had been seen at [facility] 2 days ago, he does admit that he was there for the same symptoms and was given prescription for azithromycin and albuterol inhaler.  He has been taking the antibiotic and using the inhaler with slight improvement in symptoms.  He is a non-smoker.  He denies any sick contacts.  He has been vaccinated against COVID-19 including a booster immunization.   Past Medical History:  Diagnosis Date   Coronary artery disease    a. 09/2016: Anterior STEMI with 100% Ost LAD stenosis, 95% Post Atrio stenosis, 90% Prox RCA stenosis, and 75% Ost to Prox Cx stenosis. DES to LAD. Staged DES to Post Atrio and DES to RCA performed later in admission.   ED (erectile dysfunction)    Frequency of urination    GERD (gastroesophageal reflux disease)    Hyperlipidemia    Hypertension    Ischemic cardiomyopathy    a. 09/2016: echo showing EF of 30-35% with Grade 2 DD   Myocardial infarction [facility])    Prostate cancer [facility]) urologist- dr wrenn/  oncologist-  dr Tammi Klippel   Stage T1c, Gleason 3+3, PSA 8.2, vol 49.9grams   Rash    lower right leg    Patient Active Problem List   Diagnosis  Date Noted   Ischemic cardiomyopathy 07/21/2020   Coronary artery disease involving native coronary artery of native heart with angina pectoris ([place]) 12/24/2018   Essential hypertension 12/24/2018   Hyperlipidemia 12/24/2018   ST elevation myocardial infarction (STEMI) of anterior wall, subsequent episode of care ([place]) 10/21/2016   Status post coronary artery stent placement    Acute ST elevation myocardial infarction (STEMI) involving left anterior descending (LAD) coronary artery ([place]) 10/07/2016   STEMI involving left anterior descending coronary artery ([place])    Malignant neoplasm of prostate ([place]) 09/24/2015    Past Surgical History:  Procedure Laterality Date   CARDIAC CATHETERIZATION N/A 10/07/2016   Procedure: Left Heart Cath and Coronary Angiography;  Surgeon: Troy Sine, MD;  Location: [place] CV LAB;  Service: Cardiovascular;  Laterality: N/A;   CARDIAC CATHETERIZATION N/A 10/07/2016   Procedure: Coronary Stent Intervention;  Surgeon: Troy Sine, MD;  Location: [place] CV LAB;  Service: Cardiovascular;  Laterality: N/A;   CARDIAC CATHETERIZATION N/A 10/11/2016   Procedure: Coronary Stent Intervention;  Surgeon: Peter M Martinique, MD;  Location: [place] CV LAB;  Service: Cardiovascular;  Laterality: N/A;   CYSTOSCOPY  02/19/2016   Procedure: CYSTOSCOPY;  Surgeon: Irine Seal, MD;  Location: [facility];  Service: Urology;;  no seeds visualized in bladder   NO PAST SURGERIES  RADIOACTIVE SEED IMPLANT N/A 02/19/2016   Procedure: RADIOACTIVE SEED IMPLANT/BRACHYTHERAPY IMPLANT;  Surgeon: Irine Seal, MD;  Location: [facility];  Service: Urology;  Laterality: N/A;  30   seeds implanted       Family History  Problem Relation Age of Onset   Heart attack Mother     Social History   Tobacco Use   Smoking status: Former    Packs/day: 1.50    Years: 42.00    Pack years: 63.00    Types: Cigarettes    Quit date: 02/11/2014    Years  since quitting: 7.1   Smokeless tobacco: Never  Vaping Use   Vaping Use: Never used  Substance Use Topics   Alcohol use: Yes    Comment: occasional   Drug use: No    Home Medications Prior to Admission medications   Medication Sig Start Date End Date Taking? Authorizing Provider  aspirin EC 81 MG tablet Take 81 mg by mouth daily.    [provider]  atorvastatin (LIPITOR) 80 MG tablet Take 1 tablet (80 mg total) by mouth daily. 12/29/20   Minus Breeding, MD  calcium carbonate (TUMS - DOSED IN MG ELEMENTAL CALCIUM) 500 MG chewable tablet Chew 1 tablet by mouth as needed for indigestion or heartburn.    [provider]  carvedilol (COREG) 25 MG tablet Take 1 tablet (25 mg total) by mouth 2 (two) times daily. 02/18/21   Minus Breeding, MD  ENTRESTO 97-103 MG TAKE 1 TABLET BY MOUTH TWICE A DAY 02/24/21   Minus Breeding, MD  furosemide (LASIX) 20 MG tablet Take 1 tablet (20 mg total) by mouth daily. Patient taking differently: Take 20 mg by mouth daily as needed. 01/10/20   Minus Breeding, MD  Multiple Vitamin (MULTIVITAMIN) tablet Take 1 tablet by mouth daily.    [provider]  nitroGLYCERIN (NITROSTAT) 0.4 MG SL tablet Place 1 tablet (0.4 mg total) under the tongue every 5 (five) minutes as needed for chest pain. 07/23/20   Minus Breeding, MD  PROAIR HFA 108 [phone] Base) MCG/ACT inhaler Inhale 2 puffs into the lungs as needed. 10/04/18   [provider]  spironolactone (ALDACTONE) 25 MG tablet Take 0.5 tablets (12.5 mg total) by mouth daily. 11/21/20   Minus Breeding, MD  tamsulosin (FLOMAX) 0.4 MG CAPS capsule Take 1 capsule (0.4 mg total) by mouth daily. 02/19/16   Irine Seal, MD    Allergies    Patient has no known allergies.  Review of Systems   Review of Systems  Respiratory:  Positive for shortness of breath.   All other systems reviewed and are negative.  Physical Exam Updated Vital Signs BP (!) 155/101 (BP Location: Right Arm)   Pulse 76    Temp 98.7 F (37.1 C) (Oral)   Resp 20   Ht 6\' 2"  (1.88 m)   Wt 90.7 kg   SpO2 96%   BMI 25.68 kg/m   Physical Exam Vitals and nursing note reviewed.  70 year old male, resting comfortably and in no acute distress. Vital signs are significant for elevated blood pressure. Oxygen saturation is 96%, which is normal. Head is normocephalic and atraumatic. PERRLA, EOMI. Oropharynx is clear. Neck is nontender and supple without adenopathy or JVD. Back is nontender and there is no CVA tenderness. Lungs have diminished airflow diffusely with a few scattered wheezes.  There are no rales or rhonchi. Chest is nontender. Heart has regular rate and rhythm without murmur. Abdomen is soft, flat, nontender  without masses or hepatosplenomegaly and peristalsis is normoactive. Extremities have no cyanosis or edema, full range of motion is present. Skin is warm and dry without rash. Neurologic: Mental status is normal, cranial nerves are intact, there are no motor or sensory deficits.  ED Results / Procedures / Treatments    Radiology DG Chest 2 View  Result Date: 03/20/2021 CLINICAL DATA:  Cough EXAM: CHEST - 2 VIEW COMPARISON:  03/17/2021 FINDINGS: The lungs are mildly hyperinflated in keeping with changes of underlying COPD, stable since prior examination. No superimposed focal pulmonary infiltrate. No pneumothorax or pleural effusion. Cardiac size within normal limits. Pulmonary vascularity is normal. No acute bone abnormality. IMPRESSION: No active cardiopulmonary disease.  COPD. Electronically Signed   By: Fidela Salisbury MD   On: 03/20/2021 02:50    Procedures Procedures   Medications Ordered in ED Medications  predniSONE (DELTASONE) tablet 60 mg (has no administration in time range)  ipratropium-albuterol (DUONEB) 0.5-2.5 (3) MG/3ML nebulizer solution 3 mL (3 mLs Nebulization Given 03/20/21 0353)    ED Course  I have reviewed the triage vital signs and the nursing notes.  Pertinent imaging  results that were available during my care of the patient were reviewed by me and considered in my medical decision making (see chart for details).   MDM Rules/Calculators/A&P                         Cough which is most likely from a viral bronchitis.  Will check chest x-ray to rule out pneumonia.  Old records reviewed confirming ED visit on 6/21 at which time respiratory pathogen panel was negative including negative for COVID-19.  We will add prednisone to his regimen and will give nebulizer treatment with albuterol and ipratropium.  Following above-noted treatment, lungs are completely clear and patient feels much better.  Chest x-ray is negative for pneumonia.  He is discharged with prescription for prednisone, advised to continue taking his azithromycin until completed, told to continue using his albuterol inhaler as needed.  Follow-up with PCP in 5 days.  Final Clinical Impression(s) / ED Diagnoses Final diagnoses:  Acute bronchitis, unspecified organism    Rx / DC Orders ED Discharge Orders          Ordered    predniSONE (DELTASONE) 50 MG tablet  Daily        03/20/21 2376             Delora Fuel, MD 28/31[birth year]51 [phone]

## 2021-03-29 ENCOUNTER — Emergency Department (HOSPITAL_COMMUNITY): Payer: HMO

## 2021-03-29 ENCOUNTER — Encounter (HOSPITAL_COMMUNITY): Payer: Self-pay | Admitting: Emergency Medicine

## 2021-03-29 ENCOUNTER — Other Ambulatory Visit: Payer: Self-pay

## 2021-03-29 ENCOUNTER — Emergency Department (HOSPITAL_COMMUNITY)
Admission: EM | Admit: 2021-03-29 | Discharge: 2021-03-29 | Disposition: A | Discharge status: Home / Self Care | Payer: HMO | Attending: Emergency Medicine | Admitting: Emergency Medicine

## 2021-03-29 DIAGNOSIS — Z8546 Personal history of malignant neoplasm of prostate: Secondary | ICD-10-CM | POA: Insufficient documentation

## 2021-03-29 DIAGNOSIS — I10 Essential (primary) hypertension: Secondary | ICD-10-CM | POA: Insufficient documentation

## 2021-03-29 DIAGNOSIS — R0602 Shortness of breath: Secondary | ICD-10-CM | POA: Diagnosis not present

## 2021-03-29 DIAGNOSIS — R059 Cough, unspecified: Secondary | ICD-10-CM | POA: Diagnosis not present

## 2021-03-29 DIAGNOSIS — I2510 Atherosclerotic heart disease of native coronary artery without angina pectoris: Secondary | ICD-10-CM | POA: Diagnosis not present

## 2021-03-29 DIAGNOSIS — J189 Pneumonia, unspecified organism: Secondary | ICD-10-CM | POA: Diagnosis not present

## 2021-03-29 DIAGNOSIS — Z79899 Other long term (current) drug therapy: Secondary | ICD-10-CM | POA: Insufficient documentation

## 2021-03-29 DIAGNOSIS — R040 Epistaxis: Secondary | ICD-10-CM | POA: Insufficient documentation

## 2021-03-29 DIAGNOSIS — Z87891 Personal history of nicotine dependence: Secondary | ICD-10-CM | POA: Diagnosis not present

## 2021-03-29 DIAGNOSIS — J181 Lobar pneumonia, unspecified organism: Secondary | ICD-10-CM | POA: Diagnosis not present

## 2021-03-29 DIAGNOSIS — Z7982 Long term (current) use of aspirin: Secondary | ICD-10-CM | POA: Insufficient documentation

## 2021-03-29 MED ORDER — ALBUTEROL SULFATE HFA 108 (90 BASE) MCG/ACT IN AERS
2.0000 | INHALATION_SPRAY | Freq: Once | RESPIRATORY_TRACT | Status: AC
  Administered 2021-03-29: 2 via RESPIRATORY_TRACT
  Filled 2021-03-29: qty 6.7

## 2021-03-29 MED ORDER — DOXYCYCLINE HYCLATE 100 MG PO CAPS: 100.0000 mg | ORAL_CAPSULE | Freq: Two times a day (BID) | ORAL | 0 refills | Status: DC

## 2021-03-29 NOTE — ED Notes (Signed)
ED Provider at bedside. 

## 2021-03-29 NOTE — ED Provider Notes (Signed)
[facility] EMERGENCY DEPARTMENT Provider Note   CSN: [id number] Arrival date & time: 03/29/21  0125     History Chief Complaint  Patient presents with   Epistaxis   Cough    Tyler Chavez is a 70 y.o. male.  HPI     This is a 70 year old male with a history of coronary artery disease, hypertension, hyperlipidemia who presents with cough and nosebleed.  Patient reports that he went to a cookout that was outside earlier today.  He began to feel shortness of breath and developed a cough.  He went inside into the air conditioning and felt much better.  He states he kept a dry cough when he went home.  As he was coughing, he developed a nosebleed.  It was out of his left naris.  It has since resolved without intervention.  This concerned him.  No history of smoking or asthma.  He does report a history of bronchitis.  Overall he states that he has no shortness of breath at this time or coughing.  Denies chest pain.  He has not had any recent sick contacts or fevers.  No known COVID exposures.  Past Medical History:  Diagnosis Date   Coronary artery disease    a. 09/2016: Anterior STEMI with 100% Ost LAD stenosis, 95% Post Atrio stenosis, 90% Prox RCA stenosis, and 75% Ost to Prox Cx stenosis. DES to LAD. Staged DES to Post Atrio and DES to RCA performed later in admission.   ED (erectile dysfunction)    Frequency of urination    GERD (gastroesophageal reflux disease)    Hyperlipidemia    Hypertension    Ischemic cardiomyopathy    a. 09/2016: echo showing EF of 30-35% with Grade 2 DD   Myocardial infarction [facility])    Prostate cancer [facility]) urologist- dr wrenn/  oncologist-  dr Tammi Klippel   Stage T1c, Gleason 3+3, PSA 8.2, vol 49.9grams   Rash    lower right leg    Patient Active Problem List   Diagnosis Date Noted   Ischemic cardiomyopathy 07/21/2020   Coronary artery disease involving native coronary artery of native heart with angina pectoris ([place]) 12/24/2018   Essential hypertension  12/24/2018   Hyperlipidemia 12/24/2018   ST elevation myocardial infarction (STEMI) of anterior wall, subsequent episode of care ([place]) 10/21/2016   Status post coronary artery stent placement    Acute ST elevation myocardial infarction (STEMI) involving left anterior descending (LAD) coronary artery ([place]) 10/07/2016   STEMI involving left anterior descending coronary artery ([place])    Malignant neoplasm of prostate ([place]) 09/24/2015    Past Surgical History:  Procedure Laterality Date   CARDIAC CATHETERIZATION N/A 10/07/2016   Procedure: Left Heart Cath and Coronary Angiography;  Surgeon: Troy Sine, MD;  Location: [place] [facility];  Service: Cardiovascular;  Laterality: N/A;   CARDIAC CATHETERIZATION N/A 10/07/2016   Procedure: Coronary Stent Intervention;  Surgeon: Troy Sine, MD;  Location: [place] [facility];  Service: Cardiovascular;  Laterality: N/A;   CARDIAC CATHETERIZATION N/A 10/11/2016   Procedure: Coronary Stent Intervention;  Surgeon: Peter M Martinique, MD;  Location: [place] CV LAB;  Service: Cardiovascular;  Laterality: N/A;   CYSTOSCOPY  02/19/2016   Procedure: CYSTOSCOPY;  Surgeon: Irine Seal, MD;  Location: [facility];  Service: Urology;;  no seeds visualized in bladder   NO PAST SURGERIES     RADIOACTIVE SEED IMPLANT N/A 02/19/2016   Procedure: RADIOACTIVE SEED IMPLANT/BRACHYTHERAPY IMPLANT;  Surgeon: Irine Seal, MD;  Location: [place];  Service: Urology;  Laterality: N/A;  18   seeds implanted       Family History  Problem Relation Age of Onset   Heart attack Mother     Social History   Tobacco Use   Smoking status: Former    Packs/day: 1.50    Years: 42.00    Pack years: 63.00    Types: Cigarettes    Quit date: 02/11/2014    Years since quitting: 7.1   Smokeless tobacco: Never  Vaping Use   Vaping Use: Never used  Substance Use Topics   Alcohol use: Yes    Comment: occasional   Drug use: No    Home  Medications Prior to Admission medications   Medication Sig Start Date End Date Taking? Authorizing Provider  doxycycline (VIBRAMYCIN) 100 MG capsule Take 1 capsule (100 mg total) by mouth 2 (two) times daily. 03/29/21  Yes Sumiko Ceasar, Barbette Hair, MD  aspirin EC 81 MG tablet Take 81 mg by mouth daily.    [provider]  atorvastatin (LIPITOR) 80 MG tablet Take 1 tablet (80 mg total) by mouth daily. 12/29/20   Minus Breeding, MD  calcium carbonate (TUMS - DOSED IN MG ELEMENTAL CALCIUM) 500 MG chewable tablet Chew 1 tablet by mouth as needed for indigestion or heartburn.    [provider]  carvedilol (COREG) 25 MG tablet Take 1 tablet (25 mg total) by mouth 2 (two) times daily. 02/18/21   Minus Breeding, MD  ENTRESTO 97-103 MG TAKE 1 TABLET BY MOUTH TWICE A DAY 02/24/21   Minus Breeding, MD  furosemide (LASIX) 20 MG tablet Take 1 tablet (20 mg total) by mouth daily. Patient taking differently: Take 20 mg by mouth daily as needed. 01/10/20   Minus Breeding, MD  Multiple Vitamin (MULTIVITAMIN) tablet Take 1 tablet by mouth daily.    [provider]  nitroGLYCERIN (NITROSTAT) 0.4 MG SL tablet Place 1 tablet (0.4 mg total) under the tongue every 5 (five) minutes as needed for chest pain. 07/23/20   Minus Breeding, MD  predniSONE (DELTASONE) 50 MG tablet Take 1 tablet (50 mg total) by mouth daily. 03/28/62   Delora Fuel, MD  PROAIR HFA 108 [phone] Base) MCG/ACT inhaler Inhale 2 puffs into the lungs as needed. 10/04/18   [provider]  spironolactone (ALDACTONE) 25 MG tablet Take 0.5 tablets (12.5 mg total) by mouth daily. 11/21/20   Minus Breeding, MD  tamsulosin (FLOMAX) 0.4 MG CAPS capsule Take 1 capsule (0.4 mg total) by mouth daily. 02/19/16   Irine Seal, MD    Allergies    Patient has no known allergies.  Review of Systems   Review of Systems  Constitutional:  Negative for fever.  HENT:  Positive for nosebleeds.   Respiratory:  Positive for cough. Negative for  shortness of breath.   Cardiovascular:  Negative for chest pain.  Gastrointestinal:  Negative for abdominal pain.  All other systems reviewed and are negative.  Physical Exam Updated Vital Signs BP (!) 149/92   Pulse 76   Temp 98.2 F (36.8 C) (Oral)   Resp 18   Ht 1.88 m (6\' 2" )   Wt 90.7 kg   SpO2 94%   BMI 25.68 kg/m   Physical Exam Vitals and nursing note reviewed.  Constitutional:      Appearance: He is well-developed.  HENT:     Head: Normocephalic and atraumatic.     Nose: Nose normal.     Comments: Septal  friability noted over the left nasal septum, no septal hematoma, no active bleeding, no clot noted    Mouth/Throat:     Mouth: Mucous membranes are moist.  Eyes:     Pupils: Pupils are equal, round, and reactive to light.  Cardiovascular:     Rate and Rhythm: Normal rate and regular rhythm.     Heart sounds: Normal heart sounds. No murmur heard. Pulmonary:     Effort: Pulmonary effort is normal. No respiratory distress.     Breath sounds: Wheezing present.     Comments: Slight expiratory wheeze Abdominal:     General: Bowel sounds are normal.     Palpations: Abdomen is soft.     Tenderness: There is no abdominal tenderness. There is no rebound.  Musculoskeletal:     Cervical back: Neck supple.  Lymphadenopathy:     Cervical: No cervical adenopathy.  Skin:    General: Skin is warm and dry.  Neurological:     Mental Status: He is alert and oriented to person, place, and time.  Psychiatric:        Mood and Affect: Mood normal.    ED Results / Procedures / Treatments   Labs (all labs ordered are listed, but only abnormal results are displayed) Labs Reviewed - No data to display  EKG None  Radiology DG Chest 2 View  Result Date: 03/29/2021 CLINICAL DATA:  Cough and shortness of breath for 1 week. EXAM: CHEST - 2 VIEW COMPARISON:  03/20/2021 FINDINGS: Heart size and pulmonary vascularity are normal. Suggestion of patchy infiltration in the left lung  base behind the heart. No pleural effusions. No pneumothorax. Mediastinal contours appear intact. Degenerative changes in the spine. IMPRESSION: Suggestion of developing infiltration in the left base, possibly pneumonia. Electronically Signed   By: Lucienne Capers M.D.   On: 03/29/2021 03:25    Procedures Procedures   Medications Ordered in ED Medications  albuterol (VENTOLIN HFA) 108 (90 Base) MCG/ACT inhaler 2 puff (2 puffs Inhalation Given 03/29/21 0243)    ED Course  I have reviewed the triage vital signs and the nursing notes.  Pertinent labs & imaging results that were available during my care of the patient were reviewed by me and considered in my medical decision making (see chart for details).    MDM Rules/Calculators/A&P                          Patient presents with cough and nosebleed.  Nosebleed self resolved.  He is overall nontoxic and vital signs are reassuring.  No active bleeding noted on exam.  Patient denies ongoing shortness of breath.  He does report some ongoing nonproductive cough.  Denies any other recent infectious and symptoms.  Will obtain chest x-ray.  He does have a slight wheeze on exam but is in no respiratory distress.  O2 sats 94%.  He was given albuterol inhaler for possible bronchitis.  Chest x-ray obtained.  Possibility for an early left lower lobe infiltrate.  While clinically, this would be very early for pneumonia, given chest x-ray findings we will treat with doxycycline.  After history, exam, and medical workup I feel the patient has been appropriately medically screened and is safe for discharge home. Pertinent diagnoses were discussed with the patient. Patient was given return precautions.  Final Clinical Impression(s) / ED Diagnoses Final diagnoses:  Community acquired pneumonia of left lower lobe of lung  Epistaxis    Rx / DC Orders ED  Discharge Orders          Ordered    doxycycline (VIBRAMYCIN) 100 MG capsule  2 times daily         03/29/21 0345             Zenovia Justman, Barbette Hair, MD 03/29/21 [phone]

## 2021-03-29 NOTE — Discharge Instructions (Addendum)
You were seen today for cough and nosebleed.  Your nosebleed stopped on its own.  No action needs to be taken.  Your x-ray does show possibly an early pneumonia.  Given this finding, you will be discharged with antibiotics.  Take as directed and follow-up with your primary doctor.

## 2021-03-29 NOTE — ED Notes (Signed)
Pt returned from X Ray.

## 2021-03-29 NOTE — ED Notes (Signed)
Patient transported to X-ray 

## 2021-03-29 NOTE — ED Triage Notes (Signed)
Pt c/o cough today after going to a cookout. Then states once he got home he started to have a nose bleed. No bleeding at this time.

## 2021-04-07 DIAGNOSIS — R918 Other nonspecific abnormal finding of lung field: Secondary | ICD-10-CM | POA: Diagnosis not present

## 2021-04-07 DIAGNOSIS — R059 Cough, unspecified: Secondary | ICD-10-CM | POA: Diagnosis not present

## 2021-04-15 DIAGNOSIS — Z6825 Body mass index (BMI) 25.0-25.9, adult: Secondary | ICD-10-CM | POA: Diagnosis not present

## 2021-04-15 DIAGNOSIS — E663 Overweight: Secondary | ICD-10-CM | POA: Diagnosis not present

## 2021-04-15 DIAGNOSIS — J189 Pneumonia, unspecified organism: Secondary | ICD-10-CM | POA: Diagnosis not present

## 2021-04-19 ENCOUNTER — Other Ambulatory Visit: Payer: Self-pay

## 2021-04-19 ENCOUNTER — Emergency Department (HOSPITAL_COMMUNITY): Payer: HMO

## 2021-04-19 ENCOUNTER — Encounter (HOSPITAL_COMMUNITY): Payer: Self-pay

## 2021-04-19 ENCOUNTER — Emergency Department (HOSPITAL_COMMUNITY)
Admission: EM | Admit: 2021-04-19 | Discharge: 2021-04-19 | Disposition: A | Discharge status: Home / Self Care | Payer: HMO | Attending: Emergency Medicine | Admitting: Emergency Medicine

## 2021-04-19 DIAGNOSIS — I2510 Atherosclerotic heart disease of native coronary artery without angina pectoris: Secondary | ICD-10-CM | POA: Diagnosis not present

## 2021-04-19 DIAGNOSIS — I10 Essential (primary) hypertension: Secondary | ICD-10-CM | POA: Diagnosis not present

## 2021-04-19 DIAGNOSIS — Z7982 Long term (current) use of aspirin: Secondary | ICD-10-CM | POA: Diagnosis not present

## 2021-04-19 DIAGNOSIS — Z955 Presence of coronary angioplasty implant and graft: Secondary | ICD-10-CM | POA: Insufficient documentation

## 2021-04-19 DIAGNOSIS — R040 Epistaxis: Secondary | ICD-10-CM | POA: Diagnosis not present

## 2021-04-19 DIAGNOSIS — Z87891 Personal history of nicotine dependence: Secondary | ICD-10-CM | POA: Insufficient documentation

## 2021-04-19 DIAGNOSIS — Z8546 Personal history of malignant neoplasm of prostate: Secondary | ICD-10-CM | POA: Insufficient documentation

## 2021-04-19 DIAGNOSIS — R059 Cough, unspecified: Secondary | ICD-10-CM | POA: Diagnosis not present

## 2021-04-19 DIAGNOSIS — Z79899 Other long term (current) drug therapy: Secondary | ICD-10-CM | POA: Insufficient documentation

## 2021-04-19 MED ORDER — OXYMETAZOLINE HCL 0.05 % NA SOLN
1.0000 | Freq: Once | NASAL | Status: AC
  Administered 2021-04-19: 1 via NASAL
  Filled 2021-04-19: qty 30

## 2021-04-19 NOTE — Discharge Instructions (Addendum)
Use the Afrin as needed for dry nose and bleeding.  Hold pressure for 20 to 30 minutes if bleeding recurs.  Follow-up with your doctor.  Return to the ED with persistent nosebleed, shortness of breath, chest pain, any other concerns.

## 2021-04-19 NOTE — ED Triage Notes (Signed)
Pt reports nosebleed that lasted around 30 minutes, resolved on it's own on the way here. No active bleeding at this time.

## 2021-04-19 NOTE — ED Notes (Signed)
No nose bleed at this time

## 2021-04-19 NOTE — ED Provider Notes (Signed)
[facility] EMERGENCY DEPARTMENT Provider Note   CSN: [id number] Arrival date & time: 04/19/21  0146     History Chief Complaint  Patient presents with   Epistaxis    Not bleeding at this time    Tyler Chavez is a 70 y.o. male.  Patient with a history of ischemic cardiomyopathy, hypertension, previous MI, recent treatment for pneumonia here with nosebleed.  States he had a sudden onset nosebleed at home after coughing.  Had bleeding from his right nare with blood going down the back of his throat.  This lasted about 30 minutes and has since improved.  He feels like he is still having some nasal congestion and cough.  Patient was recently treated for pneumonia and completed antibiotics 2 days ago.  He denies any shortness of breath, chest pain, fever.  No abdominal pain, nausea or vomiting. He takes aspirin but no other blood thinners. He does not smoke or have a history of asthma. States he saw his PCP last week and was told his pneumonia has resolved.  The history is provided by the patient.  Epistaxis Associated symptoms: cough   Associated symptoms: no dizziness, no fever and no headaches       Past Medical History:  Diagnosis Date   Coronary artery disease    a. 09/2016: Anterior STEMI with 100% Ost LAD stenosis, 95% Post Atrio stenosis, 90% Prox RCA stenosis, and 75% Ost to Prox Cx stenosis. DES to LAD. Staged DES to Post Atrio and DES to RCA performed later in admission.   ED (erectile dysfunction)    Frequency of urination    GERD (gastroesophageal reflux disease)    Hyperlipidemia    Hypertension    Ischemic cardiomyopathy    a. 09/2016: echo showing EF of 30-35% with Grade 2 DD   Myocardial infarction [facility])    Prostate cancer [facility]) urologist- dr wrenn/  oncologist-  dr Tammi Klippel   Stage T1c, Gleason 3+3, PSA 8.2, vol 49.9grams   Rash    lower right leg    Patient Active Problem List   Diagnosis Date Noted   Ischemic cardiomyopathy 07/21/2020   Coronary artery  disease involving native coronary artery of native heart with angina pectoris ([place]) 12/24/2018   Essential hypertension 12/24/2018   Hyperlipidemia 12/24/2018   ST elevation myocardial infarction (STEMI) of anterior wall, subsequent episode of care ([place]) 10/21/2016   Status post coronary artery stent placement    Acute ST elevation myocardial infarction (STEMI) involving left anterior descending (LAD) coronary artery ([place]) 10/07/2016   STEMI involving left anterior descending coronary artery ([place])    Malignant neoplasm of prostate ([place]) 09/24/2015    Past Surgical History:  Procedure Laterality Date   CARDIAC CATHETERIZATION N/A 10/07/2016   Procedure: Left Heart Cath and Coronary Angiography;  Surgeon: Troy Sine, MD;  Location: [place] CV LAB;  Service: Cardiovascular;  Laterality: N/A;   CARDIAC CATHETERIZATION N/A 10/07/2016   Procedure: Coronary Stent Intervention;  Surgeon: Troy Sine, MD;  Location: [place] CV LAB;  Service: Cardiovascular;  Laterality: N/A;   CARDIAC CATHETERIZATION N/A 10/11/2016   Procedure: Coronary Stent Intervention;  Surgeon: Peter M Martinique, MD;  Location: [place] CV LAB;  Service: Cardiovascular;  Laterality: N/A;   CYSTOSCOPY  02/19/2016   Procedure: CYSTOSCOPY;  Surgeon: Irine Seal, MD;  Location: [facility];  Service: Urology;;  no seeds visualized in bladder   NO PAST SURGERIES     RADIOACTIVE SEED IMPLANT N/A 02/19/2016  Procedure: RADIOACTIVE SEED IMPLANT/BRACHYTHERAPY IMPLANT;  Surgeon: Irine Seal, MD;  Location: [facility];  Service: Urology;  Laterality: N/A;  33   seeds implanted       Family History  Problem Relation Age of Onset   Heart attack Mother     Social History   Tobacco Use   Smoking status: Former    Packs/day: 1.50    Years: 42.00    Pack years: 63.00    Types: Cigarettes    Quit date: 02/11/2014    Years since quitting: 7.1   Smokeless tobacco: Never  Vaping Use   Vaping  Use: Never used  Substance Use Topics   Alcohol use: Yes    Comment: occasional   Drug use: No    Home Medications Prior to Admission medications   Medication Sig Start Date End Date Taking? Authorizing Provider  aspirin EC 81 MG tablet Take 81 mg by mouth daily.    [provider]  atorvastatin (LIPITOR) 80 MG tablet Take 1 tablet (80 mg total) by mouth daily. 12/29/20   Minus Breeding, MD  calcium carbonate (TUMS - DOSED IN MG ELEMENTAL CALCIUM) 500 MG chewable tablet Chew 1 tablet by mouth as needed for indigestion or heartburn.    [provider]  carvedilol (COREG) 25 MG tablet Take 1 tablet (25 mg total) by mouth 2 (two) times daily. 02/18/21   Minus Breeding, MD  doxycycline (VIBRAMYCIN) 100 MG capsule Take 1 capsule (100 mg total) by mouth 2 (two) times daily. 03/29/21   Horton, Barbette Hair, MD  ENTRESTO 97-103 MG TAKE 1 TABLET BY MOUTH TWICE A DAY 02/24/21   Minus Breeding, MD  furosemide (LASIX) 20 MG tablet Take 1 tablet (20 mg total) by mouth daily. Patient taking differently: Take 20 mg by mouth daily as needed. 01/10/20   Minus Breeding, MD  Multiple Vitamin (MULTIVITAMIN) tablet Take 1 tablet by mouth daily.    [provider]  nitroGLYCERIN (NITROSTAT) 0.4 MG SL tablet Place 1 tablet (0.4 mg total) under the tongue every 5 (five) minutes as needed for chest pain. 07/23/20   Minus Breeding, MD  predniSONE (DELTASONE) 50 MG tablet Take 1 tablet (50 mg total) by mouth daily. [id number]   Delora Fuel, MD  PROAIR HFA 108 [phone] Base) MCG/ACT inhaler Inhale 2 puffs into the lungs as needed. 10/04/18   [provider]  spironolactone (ALDACTONE) 25 MG tablet Take 0.5 tablets (12.5 mg total) by mouth daily. 11/21/20   Minus Breeding, MD  tamsulosin (FLOMAX) 0.4 MG CAPS capsule Take 1 capsule (0.4 mg total) by mouth daily. 02/19/16   Irine Seal, MD    Allergies    Patient has no known allergies.  Review of Systems   Review of Systems  Constitutional:   Negative for activity change, appetite change, fatigue and fever.  HENT:  Positive for nosebleeds.   Respiratory:  Positive for cough and shortness of breath. Negative for chest tightness.   Cardiovascular:  Negative for chest pain.  Gastrointestinal:  Negative for abdominal pain, nausea and vomiting.  Genitourinary:  Negative for dysuria and hematuria.  Musculoskeletal:  Negative for arthralgias and myalgias.  Skin:  Negative for rash.  Neurological:  Negative for dizziness, weakness and headaches.   all other systems are negative except as noted in the HPI and PMH.   Physical Exam Updated Vital Signs BP (!) 153/94   Pulse 75   Temp 98.4 F (36.9 C) (Oral)   Resp 17  Ht '6\' 2"'$  (1.88 m)   Wt 93 kg   SpO2 95%   BMI 26.32 kg/m   Physical Exam Vitals and nursing note reviewed.  Constitutional:      General: He is not in acute distress.    Appearance: He is well-developed.  HENT:     Head: Normocephalic and atraumatic.     Nose:     Comments: Friability to right nasal septum without active bleeding.  No clots.  No blood in the posterior oropharynx    Mouth/Throat:     Pharynx: No oropharyngeal exudate.  Eyes:     Conjunctiva/sclera: Conjunctivae normal.     Pupils: Pupils are equal, round, and reactive to light.  Neck:     Comments: No meningismus. Cardiovascular:     Rate and Rhythm: Normal rate and regular rhythm.     Heart sounds: Normal heart sounds. No murmur heard. Pulmonary:     Effort: Pulmonary effort is normal. No respiratory distress.     Breath sounds: Normal breath sounds. No wheezing.  Abdominal:     Palpations: Abdomen is soft.     Tenderness: There is no abdominal tenderness. There is no guarding or rebound.  Musculoskeletal:        General: No tenderness. Normal range of motion.     Cervical back: Normal range of motion and neck supple.  Skin:    General: Skin is warm.  Neurological:     Mental Status: He is alert and oriented to person, place, and  time.     Cranial Nerves: No cranial nerve deficit.     Motor: No abnormal muscle tone.     Coordination: Coordination normal.     Comments: No ataxia on finger to nose bilaterally. No pronator drift. 5/5 strength throughout. CN 2-12 intact.Equal grip strength. Sensation intact.   Psychiatric:        Behavior: Behavior normal.    ED Results / Procedures / Treatments   Labs (all labs ordered are listed, but only abnormal results are displayed) Labs Reviewed - No data to display  EKG None  Radiology DG Chest Portable 1 View  Result Date: 04/19/2021 CLINICAL DATA:  Cough EXAM: PORTABLE CHEST 1 VIEW COMPARISON:  04/07/2021 FINDINGS: The heart size and mediastinal contours are within normal limits. Both lungs are clear. The visualized skeletal structures are unremarkable. IMPRESSION: No active disease. Electronically Signed   By: Ulyses Jarred M.D.   On: 04/19/2021 03:14    Procedures Procedures   Medications Ordered in ED Medications  oxymetazoline (AFRIN) 0.05 % nasal spray 1 spray (has no administration in time range)    ED Course  I have reviewed the triage vital signs and the nursing notes.  Pertinent labs & imaging results that were available during my care of the patient were reviewed by me and considered in my medical decision making (see chart for details).    MDM Rules/Calculators/A&P                           Epistaxis after coughing fit, now resolved without intervention.  Friability to the right nasal mucosa without evidence of active bleeding. No further shortness of breath or chest pain Lung clear without wheezing  Afrin Given.  Monitored in the ED with no recurrent nasal bleeding.  Chest x-ray is normal.  Previous pneumonia has resolved.  Patient denies any difficulty breathing or chest pain.  Advised Afrin and PCP follow-up.  Hold pressure if  bleeding recurs.  Return precautions discussed Final Clinical Impression(s) / ED Diagnoses Final diagnoses:   Right-sided epistaxis    Rx / DC Orders ED Discharge Orders     None        Keonte Daubenspeck, Annie Main, MD 04/19/21 [phone]

## 2021-05-03 ENCOUNTER — Emergency Department (HOSPITAL_COMMUNITY)
Admission: EM | Admit: 2021-05-03 | Discharge: 2021-05-03 | Disposition: A | Discharge status: Home / Self Care | Payer: HMO | Attending: Emergency Medicine | Admitting: Emergency Medicine

## 2021-05-03 ENCOUNTER — Other Ambulatory Visit: Payer: Self-pay

## 2021-05-03 ENCOUNTER — Encounter (HOSPITAL_COMMUNITY): Payer: Self-pay | Admitting: Emergency Medicine

## 2021-05-03 ENCOUNTER — Emergency Department (HOSPITAL_COMMUNITY): Payer: HMO

## 2021-05-03 DIAGNOSIS — R040 Epistaxis: Secondary | ICD-10-CM | POA: Diagnosis not present

## 2021-05-03 DIAGNOSIS — Z7982 Long term (current) use of aspirin: Secondary | ICD-10-CM | POA: Diagnosis not present

## 2021-05-03 DIAGNOSIS — Z79899 Other long term (current) drug therapy: Secondary | ICD-10-CM | POA: Insufficient documentation

## 2021-05-03 DIAGNOSIS — J9811 Atelectasis: Secondary | ICD-10-CM | POA: Diagnosis not present

## 2021-05-03 DIAGNOSIS — Z20822 Contact with and (suspected) exposure to covid-19: Secondary | ICD-10-CM | POA: Insufficient documentation

## 2021-05-03 DIAGNOSIS — I10 Essential (primary) hypertension: Secondary | ICD-10-CM | POA: Diagnosis not present

## 2021-05-03 DIAGNOSIS — Z87891 Personal history of nicotine dependence: Secondary | ICD-10-CM | POA: Diagnosis not present

## 2021-05-03 DIAGNOSIS — Z8546 Personal history of malignant neoplasm of prostate: Secondary | ICD-10-CM | POA: Diagnosis not present

## 2021-05-03 DIAGNOSIS — I25119 Atherosclerotic heart disease of native coronary artery with unspecified angina pectoris: Secondary | ICD-10-CM | POA: Insufficient documentation

## 2021-05-03 DIAGNOSIS — R059 Cough, unspecified: Secondary | ICD-10-CM | POA: Diagnosis not present

## 2021-05-03 LAB — RESP PANEL BY RT-PCR (FLU A&B, COVID) ARPGX2
Influenza A by PCR: NEGATIVE
Influenza B by PCR: NEGATIVE
SARS Coronavirus 2 by RT PCR: NEGATIVE

## 2021-05-03 NOTE — ED Triage Notes (Signed)
Pt reports cough and "heaving" that caused a nose bleed yesterday morning and this morning.

## 2021-05-03 NOTE — ED Provider Notes (Signed)
[facility] Provider Note   CSN: [id number] Arrival date & time: 05/03/21  1506     History No chief complaint on file.   Tyler Chavez is a 70 y.o. male.  Patient states he is coughed a few times and each time he coughed he had a nosebleed for short period time patient having no bleeding now  The history is provided by the patient and medical records. No language interpreter was used.  Epistaxis Location:  Bilateral Severity:  Mild Timing:  Sporadic Progression:  Resolved Chronicity:  New Context: aspirin use   Relieved by:  Nothing Worsened by:  Nothing Ineffective treatments:  None tried Associated symptoms: no blood in oropharynx, no congestion, no cough and no headaches       Past Medical History:  Diagnosis Date   Coronary artery disease    a. 09/2016: Anterior STEMI with 100% Ost LAD stenosis, 95% Post Atrio stenosis, 90% Prox RCA stenosis, and 75% Ost to Prox Cx stenosis. DES to LAD. Staged DES to Post Atrio and DES to RCA performed later in admission.   ED (erectile dysfunction)    Frequency of urination    GERD (gastroesophageal reflux disease)    Hyperlipidemia    Hypertension    Ischemic cardiomyopathy    a. 09/2016: echo showing EF of 30-35% with Grade 2 DD   Myocardial infarction [facility])    Prostate cancer [facility]) urologist- dr wrenn/  oncologist-  dr Tammi Klippel   Stage T1c, Gleason 3+3, PSA 8.2, vol 49.9grams   Rash    lower right leg    Patient Active Problem List   Diagnosis Date Noted   Ischemic cardiomyopathy 07/21/2020   Coronary artery disease involving native coronary artery of native heart with angina pectoris ([place]) 12/24/2018   Essential hypertension 12/24/2018   Hyperlipidemia 12/24/2018   ST elevation myocardial infarction (STEMI) of anterior wall, subsequent episode of care ([place]) 10/21/2016   Status post coronary artery stent placement    Acute ST elevation myocardial infarction (STEMI) involving left anterior descending  (LAD) coronary artery ([place]) 10/07/2016   STEMI involving left anterior descending coronary artery ([place])    Malignant neoplasm of prostate ([place]) 09/24/2015    Past Surgical History:  Procedure Laterality Date   CARDIAC CATHETERIZATION N/A 10/07/2016   Procedure: Left Heart Cath and Coronary Angiography;  Surgeon: Troy Sine, MD;  Location: [place] [facility];  Service: Cardiovascular;  Laterality: N/A;   CARDIAC CATHETERIZATION N/A 10/07/2016   Procedure: Coronary Stent Intervention;  Surgeon: Troy Sine, MD;  Location: [place] CV LAB;  Service: Cardiovascular;  Laterality: N/A;   CARDIAC CATHETERIZATION N/A 10/11/2016   Procedure: Coronary Stent Intervention;  Surgeon: Peter M Martinique, MD;  Location: [place] [facility];  Service: Cardiovascular;  Laterality: N/A;   CYSTOSCOPY  02/19/2016   Procedure: CYSTOSCOPY;  Surgeon: Irine Seal, MD;  Location: [facility];  Service: Urology;;  no seeds visualized in bladder   NO PAST SURGERIES     RADIOACTIVE SEED IMPLANT N/A 02/19/2016   Procedure: RADIOACTIVE SEED IMPLANT/BRACHYTHERAPY IMPLANT;  Surgeon: Irine Seal, MD;  Location: [place];  Service: Urology;  Laterality: N/A;  39   seeds implanted       Family History  Problem Relation Age of Onset   Heart attack Mother     Social History   Tobacco Use   Smoking status: Former    Packs/day: 1.50    Years: 42.00    Pack years:  63.00    Types: Cigarettes    Quit date: 02/11/2014    Years since quitting: 7.2   Smokeless tobacco: Never  Vaping Use   Vaping Use: Never used  Substance Use Topics   Alcohol use: Yes    Comment: occasional   Drug use: No    Home Medications Prior to Admission medications   Medication Sig Start Date End Date Taking? Authorizing Provider  aspirin EC 81 MG tablet Take 81 mg by mouth daily.    [provider]  atorvastatin (LIPITOR) 80 MG tablet Take 1 tablet (80 mg total) by mouth daily. 12/29/20   Minus Breeding, MD  calcium carbonate (TUMS - DOSED IN MG ELEMENTAL CALCIUM) 500 MG chewable tablet Chew 1 tablet by mouth as needed for indigestion or heartburn.    [provider]  carvedilol (COREG) 25 MG tablet Take 1 tablet (25 mg total) by mouth 2 (two) times daily. 02/18/21   Minus Breeding, MD  doxycycline (VIBRAMYCIN) 100 MG capsule Take 1 capsule (100 mg total) by mouth 2 (two) times daily. 03/29/21   Horton, Barbette Hair, MD  ENTRESTO 97-103 MG TAKE 1 TABLET BY MOUTH TWICE A DAY 02/24/21   Minus Breeding, MD  furosemide (LASIX) 20 MG tablet Take 1 tablet (20 mg total) by mouth daily. Patient taking differently: Take 20 mg by mouth daily as needed. 01/10/20   Minus Breeding, MD  Multiple Vitamin (MULTIVITAMIN) tablet Take 1 tablet by mouth daily.    [provider]  nitroGLYCERIN (NITROSTAT) 0.4 MG SL tablet Place 1 tablet (0.4 mg total) under the tongue every 5 (five) minutes as needed for chest pain. 07/23/20   Minus Breeding, MD  predniSONE (DELTASONE) 50 MG tablet Take 1 tablet (50 mg total) by mouth daily. [id number]   Delora Fuel, MD  PROAIR HFA 108 [phone] Base) MCG/ACT inhaler Inhale 2 puffs into the lungs as needed. 10/04/18   [provider]  spironolactone (ALDACTONE) 25 MG tablet Take 0.5 tablets (12.5 mg total) by mouth daily. 11/21/20   Minus Breeding, MD  tamsulosin (FLOMAX) 0.4 MG CAPS capsule Take 1 capsule (0.4 mg total) by mouth daily. 02/19/16   Irine Seal, MD    Allergies    Patient has no known allergies.  Review of Systems   Review of Systems  Constitutional:  Negative for appetite change and fatigue.  HENT:  Positive for nosebleeds. Negative for congestion, ear discharge and sinus pressure.   Eyes:  Negative for discharge.  Respiratory:  Negative for cough.   Cardiovascular:  Negative for chest pain.  Gastrointestinal:  Negative for abdominal pain and diarrhea.  Genitourinary:  Negative for frequency and hematuria.  Musculoskeletal:  Negative for  back pain.  Skin:  Negative for rash.  Neurological:  Negative for seizures and headaches.  Psychiatric/Behavioral:  Negative for hallucinations.    Physical Exam Updated Vital Signs BP 123/78 (BP Location: Right Arm)   Pulse 65   Temp 98.2 F (36.8 C) (Oral)   Resp 18   Ht '6\' 2"'$  (1.88 m)   Wt 91.6 kg   SpO2 94%   BMI 25.94 kg/m   Physical Exam Vitals and nursing note reviewed.  Constitutional:      Appearance: He is well-developed.  HENT:     Head: Normocephalic.     Nose: Nose normal.  Eyes:     General: No scleral icterus.    Conjunctiva/sclera: Conjunctivae normal.  Neck:     Thyroid: No thyromegaly.  Cardiovascular:  Rate and Rhythm: Normal rate and regular rhythm.     Heart sounds: No murmur heard.   No friction rub. No gallop.  Pulmonary:     Breath sounds: No stridor. No wheezing or rales.  Chest:     Chest wall: No tenderness.  Abdominal:     General: There is no distension.     Tenderness: There is no abdominal tenderness. There is no rebound.  Musculoskeletal:        General: Normal range of motion.     Cervical back: Neck supple.  Lymphadenopathy:     Cervical: No cervical adenopathy.  Skin:    Findings: No erythema or rash.  Neurological:     Mental Status: He is alert and oriented to person, place, and time.     Motor: No abnormal muscle tone.     Coordination: Coordination normal.  Psychiatric:        Behavior: Behavior normal.    ED Results / Procedures / Treatments   Labs (all labs ordered are listed, but only abnormal results are displayed) Labs Reviewed  RESP PANEL BY RT-PCR (FLU A&B, COVID) [id number]    EKG None  Radiology DG Chest Port 1 View  Result Date: 05/03/2021 CLINICAL DATA:  Cough for several days. EXAM: PORTABLE CHEST 1 VIEW COMPARISON:  04/19/2021 and prior radiographs FINDINGS: The cardiomediastinal silhouette is unremarkable. Mild subsegmental LEFT basilar atelectasis is now noted. Mild peribronchial thickening is  again noted. There is no evidence of focal airspace disease, pulmonary edema, suspicious pulmonary nodule/mass, pleural effusion, or pneumothorax. No acute bony abnormalities are identified. IMPRESSION: Mild subsegmental LEFT basilar atelectasis. Electronically Signed   By: Margarette Canada M.D.   On: 05/03/2021 16:22    Procedures Procedures   Medications Ordered in ED Medications - No data to display  ED Course  I have reviewed the triage vital signs and the nursing notes.  Pertinent labs & imaging results that were available during my care of the patient were reviewed by me and considered in my medical decision making (see chart for details).    MDM Rules/Calculators/A&P                           Resolved nosebleed with occasional cough Final Clinical Impression(s) / ED Diagnoses Final diagnoses:  Epistaxis    Rx / DC Orders ED Discharge Orders     None        Milton Ferguson, MD 05/03/21 1726

## 2021-05-03 NOTE — Discharge Instructions (Addendum)
Follow-up with your family doctor if not improving 

## 2021-05-21 ENCOUNTER — Emergency Department (HOSPITAL_COMMUNITY): Payer: HMO

## 2021-05-21 ENCOUNTER — Emergency Department (HOSPITAL_COMMUNITY)
Admission: EM | Admit: 2021-05-21 | Discharge: 2021-05-22 | Disposition: A | Discharge status: Home / Self Care | Payer: HMO | Attending: Emergency Medicine | Admitting: Emergency Medicine

## 2021-05-21 ENCOUNTER — Other Ambulatory Visit: Payer: Self-pay

## 2021-05-21 DIAGNOSIS — R042 Hemoptysis: Secondary | ICD-10-CM

## 2021-05-21 DIAGNOSIS — Z7982 Long term (current) use of aspirin: Secondary | ICD-10-CM | POA: Diagnosis not present

## 2021-05-21 DIAGNOSIS — I10 Essential (primary) hypertension: Secondary | ICD-10-CM | POA: Insufficient documentation

## 2021-05-21 DIAGNOSIS — R0602 Shortness of breath: Secondary | ICD-10-CM | POA: Diagnosis not present

## 2021-05-21 DIAGNOSIS — Z87891 Personal history of nicotine dependence: Secondary | ICD-10-CM | POA: Diagnosis not present

## 2021-05-21 DIAGNOSIS — R059 Cough, unspecified: Secondary | ICD-10-CM | POA: Diagnosis not present

## 2021-05-21 DIAGNOSIS — J439 Emphysema, unspecified: Secondary | ICD-10-CM | POA: Diagnosis not present

## 2021-05-21 DIAGNOSIS — Z20822 Contact with and (suspected) exposure to covid-19: Secondary | ICD-10-CM | POA: Diagnosis not present

## 2021-05-21 DIAGNOSIS — Z8546 Personal history of malignant neoplasm of prostate: Secondary | ICD-10-CM | POA: Diagnosis not present

## 2021-05-21 DIAGNOSIS — I25119 Atherosclerotic heart disease of native coronary artery with unspecified angina pectoris: Secondary | ICD-10-CM | POA: Insufficient documentation

## 2021-05-21 DIAGNOSIS — J40 Bronchitis, not specified as acute or chronic: Secondary | ICD-10-CM | POA: Diagnosis not present

## 2021-05-21 DIAGNOSIS — Z79899 Other long term (current) drug therapy: Secondary | ICD-10-CM | POA: Diagnosis not present

## 2021-05-21 MED ORDER — ALBUTEROL SULFATE HFA 108 (90 BASE) MCG/ACT IN AERS
2.0000 | INHALATION_SPRAY | Freq: Once | RESPIRATORY_TRACT | Status: AC
  Administered 2021-05-21: 2 via RESPIRATORY_TRACT
  Filled 2021-05-21: qty 6.7

## 2021-05-21 NOTE — ED Triage Notes (Signed)
Pov from home with cc of shortness of breath with coughing. Said that today he noticed that he coughed up a little bit of bright red blood that made his head feel swimmy.  89% post ambulation to tx room,  94% restring in bed on room air.

## 2021-05-21 NOTE — ED Provider Notes (Signed)
[facility] EMERGENCY DEPARTMENT Provider Note   CSN: [id number] Arrival date & time: 05/21/21  2252     History Chief Complaint  Patient presents with   Shortness of Breath    Tyler Chavez is a 70 y.o. male.  Patient with history of ischemic cardiomyopathy, hypertension, previous pneumonia, recurrent nosebleeds here with cough and hemoptysis today.  States he developed a cough today productive of clear to white mucus.  He saw some specks of blood in his cough x2.  He said it was bright red.  At the time he was little bit dizzy which has since resolved.  He denies taking any blood thinners.  Denies any recent nosebleeds though he has a history of this.  No nausea or vomiting.  No chest pain or shortness of breath.  Denies any history of asthma or COPD.  No focal weakness, numbness or tingling.  No leg pain or leg swelling.  No dizziness currently. Feels back to baseline now.  Denies any pain with breathing or shortness of breath currently.  Is requesting a COVID test  The history is provided by the patient.  Shortness of Breath Associated symptoms: cough and wheezing   Associated symptoms: no abdominal pain, no chest pain, no fever, no headaches and no vomiting       Past Medical History:  Diagnosis Date   Coronary artery disease    a. 09/2016: Anterior STEMI with 100% Ost LAD stenosis, 95% Post Atrio stenosis, 90% Prox RCA stenosis, and 75% Ost to Prox Cx stenosis. DES to LAD. Staged DES to Post Atrio and DES to RCA performed later in admission.   ED (erectile dysfunction)    Frequency of urination    GERD (gastroesophageal reflux disease)    Hyperlipidemia    Hypertension    Ischemic cardiomyopathy    a. 09/2016: echo showing EF of 30-35% with Grade 2 DD   Myocardial infarction [facility])    Prostate cancer [facility]) urologist- dr wrenn/  oncologist-  dr Tammi Klippel   Stage T1c, Gleason 3+3, PSA 8.2, vol 49.9grams   Rash    lower right leg    Patient Active Problem List   Diagnosis  Date Noted   Ischemic cardiomyopathy 07/21/2020   Coronary artery disease involving native coronary artery of native heart with angina pectoris ([place]) 12/24/2018   Essential hypertension 12/24/2018   Hyperlipidemia 12/24/2018   ST elevation myocardial infarction (STEMI) of anterior wall, subsequent episode of care ([place]) 10/21/2016   Status post coronary artery stent placement    Acute ST elevation myocardial infarction (STEMI) involving left anterior descending (LAD) coronary artery ([place]) 10/07/2016   STEMI involving left anterior descending coronary artery ([place])    Malignant neoplasm of prostate ([place]) 09/24/2015    Past Surgical History:  Procedure Laterality Date   CARDIAC CATHETERIZATION N/A 10/07/2016   Procedure: Left Heart Cath and Coronary Angiography;  Surgeon: Troy Sine, MD;  Location: [place] CV LAB;  Service: Cardiovascular;  Laterality: N/A;   CARDIAC CATHETERIZATION N/A 10/07/2016   Procedure: Coronary Stent Intervention;  Surgeon: Troy Sine, MD;  Location: [place] CV LAB;  Service: Cardiovascular;  Laterality: N/A;   CARDIAC CATHETERIZATION N/A 10/11/2016   Procedure: Coronary Stent Intervention;  Surgeon: Peter M Martinique, MD;  Location: [place] CV LAB;  Service: Cardiovascular;  Laterality: N/A;   CYSTOSCOPY  02/19/2016   Procedure: CYSTOSCOPY;  Surgeon: Irine Seal, MD;  Location: [facility];  Service: Urology;;  no seeds visualized in bladder  NO PAST SURGERIES     RADIOACTIVE SEED IMPLANT N/A 02/19/2016   Procedure: RADIOACTIVE SEED IMPLANT/BRACHYTHERAPY IMPLANT;  Surgeon: Irine Seal, MD;  Location: [facility];  Service: Urology;  Laterality: N/A;  81   seeds implanted       Family History  Problem Relation Age of Onset   Heart attack Mother     Social History   Tobacco Use   Smoking status: Former    Packs/day: 1.50    Years: 42.00    Pack years: 63.00    Types: Cigarettes    Quit date: 02/11/2014    Years  since quitting: 7.2   Smokeless tobacco: Never  Vaping Use   Vaping Use: Never used  Substance Use Topics   Alcohol use: Yes    Comment: occasional   Drug use: No    Home Medications Prior to Admission medications   Medication Sig Start Date End Date Taking? Authorizing Provider  aspirin EC 81 MG tablet Take 81 mg by mouth daily.    [provider]  atorvastatin (LIPITOR) 80 MG tablet Take 1 tablet (80 mg total) by mouth daily. 12/29/20   Minus Breeding, MD  calcium carbonate (TUMS - DOSED IN MG ELEMENTAL CALCIUM) 500 MG chewable tablet Chew 1 tablet by mouth as needed for indigestion or heartburn.    [provider]  carvedilol (COREG) 25 MG tablet Take 1 tablet (25 mg total) by mouth 2 (two) times daily. 02/18/21   Minus Breeding, MD  doxycycline (VIBRAMYCIN) 100 MG capsule Take 1 capsule (100 mg total) by mouth 2 (two) times daily. 03/29/21   Horton, Barbette Hair, MD  ENTRESTO 97-103 MG TAKE 1 TABLET BY MOUTH TWICE A DAY 02/24/21   Minus Breeding, MD  furosemide (LASIX) 20 MG tablet Take 1 tablet (20 mg total) by mouth daily. Patient taking differently: Take 20 mg by mouth daily as needed. 01/10/20   Minus Breeding, MD  Multiple Vitamin (MULTIVITAMIN) tablet Take 1 tablet by mouth daily.    [provider]  nitroGLYCERIN (NITROSTAT) 0.4 MG SL tablet Place 1 tablet (0.4 mg total) under the tongue every 5 (five) minutes as needed for chest pain. 07/23/20   Minus Breeding, MD  predniSONE (DELTASONE) 50 MG tablet Take 1 tablet (50 mg total) by mouth daily. [id number]   Delora Fuel, MD  PROAIR HFA 108 [phone] Base) MCG/ACT inhaler Inhale 2 puffs into the lungs as needed. 10/04/18   [provider]  spironolactone (ALDACTONE) 25 MG tablet Take 0.5 tablets (12.5 mg total) by mouth daily. 11/21/20   Minus Breeding, MD  tamsulosin (FLOMAX) 0.4 MG CAPS capsule Take 1 capsule (0.4 mg total) by mouth daily. 02/19/16   Irine Seal, MD    Allergies    Patient has no known  allergies.  Review of Systems   Review of Systems  Constitutional:  Negative for activity change, appetite change and fever.  HENT:  Negative for congestion and rhinorrhea.   Respiratory:  Positive for cough, shortness of breath and wheezing.   Cardiovascular:  Negative for chest pain, palpitations and leg swelling.  Gastrointestinal:  Negative for abdominal pain, nausea and vomiting.  Genitourinary:  Negative for dysuria, flank pain and hematuria.  Musculoskeletal:  Negative for arthralgias and myalgias.  Neurological:  Negative for dizziness, weakness and headaches.   all other systems are negative except as noted in the HPI and PMH.   Physical Exam Updated Vital Signs BP (!) 147/84   Pulse 66  Temp 98.2 F (36.8 C)   Resp 19   Ht '6\' 2"'$  (1.88 m)   Wt 91.6 kg   SpO2 94%   BMI 25.94 kg/m   Physical Exam Vitals and nursing note reviewed.  Constitutional:      General: He is not in acute distress.    Appearance: He is well-developed.  HENT:     Head: Normocephalic and atraumatic.     Mouth/Throat:     Pharynx: No oropharyngeal exudate.     Comments: No blood in posterior pharynx Eyes:     Conjunctiva/sclera: Conjunctivae normal.     Pupils: Pupils are equal, round, and reactive to light.  Neck:     Comments: No meningismus. Cardiovascular:     Rate and Rhythm: Normal rate and regular rhythm.     Heart sounds: Normal heart sounds. No murmur heard. Pulmonary:     Effort: Pulmonary effort is normal. No respiratory distress.     Breath sounds: Wheezing present.     Comments: Expiratory wheezing Chest:     Chest wall: No tenderness.  Abdominal:     Palpations: Abdomen is soft.     Tenderness: There is no abdominal tenderness. There is no guarding or rebound.  Musculoskeletal:        General: No tenderness. Normal range of motion.     Cervical back: Normal range of motion and neck supple.  Skin:    General: Skin is warm.  Neurological:     General: No focal  deficit present.     Mental Status: He is alert and oriented to person, place, and time. Mental status is at baseline.     Cranial Nerves: No cranial nerve deficit.     Motor: No abnormal muscle tone.     Coordination: Coordination normal.     Comments:  5/5 strength throughout. CN 2-12 intact.Equal grip strength.   Psychiatric:        Behavior: Behavior normal.    ED Results / Procedures / Treatments   Labs (all labs ordered are listed, but only abnormal results are displayed) Labs Reviewed  BASIC METABOLIC PANEL - Abnormal; Notable for the following components:      Result Value   Glucose, Bld 124 (*)    Calcium 8.4 (*)    Anion gap 4 (*)    All other components within normal limits  BRAIN NATRIURETIC PEPTIDE - Abnormal; Notable for the following components:   B Natriuretic Peptide 355.0 (*)    All other components within normal limits  RESP PANEL BY RT-PCR (FLU A&B, COVID) ARPGX2  CBC WITH DIFFERENTIAL/PLATELET  D-DIMER, QUANTITATIVE  TROPONIN I (HIGH SENSITIVITY)  TROPONIN I (HIGH SENSITIVITY)    EKG EKG Interpretation  Date/Time:  Thursday May 21 2021 23:06:33 EDT Ventricular Rate:  69 PR Interval:  202 QRS Duration: 106 QT Interval:  409 QTC Calculation: 439 R Axis:   -73 Text Interpretation: Sinus rhythm Incomplete RBBB and LAFB Probable anterior infarct, age indeterminate new lateral T wave inversions Confirmed by Ezequiel Essex [phone]) on 05/21/2021 11:26:21 PM  Radiology DG Chest Portable 1 View  Result Date: 05/21/2021 CLINICAL DATA:  Hemoptysis.  Shortness of breath.  Cough. EXAM: PORTABLE CHEST 1 VIEW COMPARISON:  05/03/2021 FINDINGS: Heart size and pulmonary vascularity are normal. Emphysematous changes in the lungs. Linear scarring in the lung bases. No airspace disease suggested. No pleural effusions. No pneumothorax. Mediastinal contours appear intact. IMPRESSION: Emphysematous changes and fibrosis in the lungs. No focal consolidation. Electronically  Signed  By: Lucienne Capers M.D.   On: 05/21/2021 23:51    Procedures Procedures   Medications Ordered in ED Medications  albuterol (VENTOLIN HFA) 108 (90 Base) MCG/ACT inhaler 2 puff (2 puffs Inhalation Given 05/21/21 2340)    ED Course  I have reviewed the triage vital signs and the nursing notes.  Pertinent labs & imaging results that were available during my care of the patient were reviewed by me and considered in my medical decision making (see chart for details).    MDM Rules/Calculators/A&P                          Patient here with cough and some blood streaks in his sputum.  Vitals are stable.  No distress.  Some expiratory wheezing.  No blood thinner use.  Will check chest x-ray and give albuterol.  EKG does shows new T wave inversions laterally. However these were present in 2020.  X-ray shows emphysema and fibrosis, no consolidation or mass. Labs reassuring.  Troponin negative, D-dimer negative. Doubt pulmonary embolism.  On recheck, lungs are clear.  No hypoxia or increased work of breathing. Patient able to ambulate without desaturation.  We will treat for suspected bronchitis with bronchodilators and antibiotics.  He is not wheezing so will defer steroids.  Troponin negative x2.  Patient well-appearing, no hypoxia or increased work of breathing.  No further hemoptysis.  Low suspicion for pulmonary embolism or ACS.  Treat with antibiotics for suspected bronchitis.  Given bronchodilators.  Follow-up with PCP.  Return precautions discussed  Vonte Irene was evaluated in Emergency Department on 05/22/2021 for the symptoms described in the history of present illness. He was evaluated in the context of the global COVID-19 pandemic, which necessitated consideration that the patient might be at risk for infection with the SARS-CoV-2 virus that causes COVID-19. Institutional protocols and algorithms that pertain to the evaluation of patients at risk for COVID-19 are in a  state of rapid change based on information released by regulatory bodies including the CDC and federal and state organizations. These policies and algorithms were followed during the patient's care in the ED.  Final Clinical Impression(s) / ED Diagnoses Final diagnoses:  Bronchitis  Hemoptysis    Rx / DC Orders ED Discharge Orders     None        Malick Netz, Annie Main, MD 05/22/21 [id number]

## 2021-05-22 LAB — TROPONIN I (HIGH SENSITIVITY)
Troponin I (High Sensitivity): 5 ng/L (ref <18)
Troponin I (High Sensitivity): 6 ng/L (ref <18)

## 2021-05-22 LAB — CBC WITH DIFFERENTIAL/PLATELET
Abs Immature Granulocytes: 0.02 10*3/uL (ref 0.00–0.07)
Basophils Absolute: 0.1 10*3/uL (ref 0.0–0.1)
Basophils Relative: 1 %
Eosinophils Absolute: 0.2 10*3/uL (ref 0.0–0.5)
Eosinophils Relative: 5 %
HCT: 42 % (ref 39.0–52.0)
Hemoglobin: 13.9 g/dL (ref 13.0–17.0)
Immature Granulocytes: 0 %
Lymphocytes Relative: 23 %
Lymphs Abs: 1 10*3/uL (ref 0.7–4.0)
MCH: 32.1 pg (ref 26.0–34.0)
MCHC: 33.1 g/dL (ref 30.0–36.0)
MCV: 97 fL (ref 80.0–100.0)
Monocytes Absolute: 0.4 10*3/uL (ref 0.1–1.0)
Monocytes Relative: 8 %
Neutro Abs: 2.9 10*3/uL (ref 1.7–7.7)
Neutrophils Relative %: 63 %
Platelets: 166 10*3/uL (ref 150–400)
RBC: 4.33 MIL/uL (ref 4.22–5.81)
RDW: 12.7 % (ref 11.5–15.5)
WBC: 4.6 10*3/uL (ref 4.0–10.5)
nRBC: 0 % (ref 0.0–0.2)

## 2021-05-22 LAB — BASIC METABOLIC PANEL
Anion gap: 4 — ABNORMAL LOW (ref 5–15)
BUN: 18 mg/dL (ref 8–23)
CO2: 27 mmol/L (ref 22–32)
Calcium: 8.4 mg/dL — ABNORMAL LOW (ref 8.9–10.3)
Chloride: 108 mmol/L (ref 98–111)
Creatinine, Ser: 0.81 mg/dL (ref 0.61–1.24)
GFR, Estimated: >60 mL/min (ref >60)
Glucose, Bld: 124 mg/dL — ABNORMAL HIGH (ref 70–99)
Potassium: 3.7 mmol/L (ref 3.5–5.1)
Sodium: 139 mmol/L (ref 135–145)

## 2021-05-22 LAB — BRAIN NATRIURETIC PEPTIDE: B Natriuretic Peptide: 355 pg/mL — ABNORMAL HIGH (ref 0.0–100.0)

## 2021-05-22 LAB — RESP PANEL BY RT-PCR (FLU A&B, COVID) ARPGX2
Influenza A by PCR: NEGATIVE
Influenza B by PCR: NEGATIVE
SARS Coronavirus 2 by RT PCR: NEGATIVE

## 2021-05-22 LAB — D-DIMER, QUANTITATIVE: D-Dimer, Quant: 0.28 ug/mL-FEU (ref 0.00–0.50)

## 2021-05-22 MED ORDER — DOXYCYCLINE HYCLATE 100 MG PO CAPS: 100.0000 mg | ORAL_CAPSULE | Freq: Two times a day (BID) | ORAL | 0 refills | Status: DC

## 2021-05-22 NOTE — Discharge Instructions (Addendum)
Take the antibiotics as prescribed.  Follow-up with your doctor.  Return to the ED with chest pain, shortness of breath, worsening coughing up blood or any other concerns.

## 2021-05-22 NOTE — ED Notes (Signed)
Pt ambulated without any problems noted no pain or shortness of breath. Pulse ox stayed between 95-98 percent . Tyler Chavez

## 2021-06-03 DIAGNOSIS — I509 Heart failure, unspecified: Secondary | ICD-10-CM | POA: Diagnosis not present

## 2021-06-03 DIAGNOSIS — Z23 Encounter for immunization: Secondary | ICD-10-CM | POA: Diagnosis not present

## 2021-06-03 DIAGNOSIS — Z6825 Body mass index (BMI) 25.0-25.9, adult: Secondary | ICD-10-CM | POA: Diagnosis not present

## 2021-06-03 DIAGNOSIS — E663 Overweight: Secondary | ICD-10-CM | POA: Diagnosis not present

## 2021-06-03 DIAGNOSIS — J449 Chronic obstructive pulmonary disease, unspecified: Secondary | ICD-10-CM | POA: Diagnosis not present

## 2021-06-07 ENCOUNTER — Other Ambulatory Visit: Payer: Self-pay | Admitting: Cardiology

## 2021-06-29 DIAGNOSIS — Z8546 Personal history of malignant neoplasm of prostate: Secondary | ICD-10-CM | POA: Diagnosis not present

## 2021-06-30 ENCOUNTER — Other Ambulatory Visit: Payer: Self-pay | Admitting: Cardiology

## 2021-07-05 ENCOUNTER — Other Ambulatory Visit: Payer: Self-pay | Admitting: Cardiology

## 2021-07-06 DIAGNOSIS — R3912 Poor urinary stream: Secondary | ICD-10-CM | POA: Diagnosis not present

## 2021-07-06 DIAGNOSIS — Z8546 Personal history of malignant neoplasm of prostate: Secondary | ICD-10-CM | POA: Diagnosis not present

## 2021-07-06 DIAGNOSIS — N5201 Erectile dysfunction due to arterial insufficiency: Secondary | ICD-10-CM | POA: Diagnosis not present

## 2021-07-06 DIAGNOSIS — N401 Enlarged prostate with lower urinary tract symptoms: Secondary | ICD-10-CM | POA: Diagnosis not present

## 2021-07-14 DIAGNOSIS — N62 Hypertrophy of breast: Secondary | ICD-10-CM | POA: Diagnosis not present

## 2021-08-07 ENCOUNTER — Other Ambulatory Visit: Payer: Self-pay | Admitting: Cardiology

## 2021-09-09 DIAGNOSIS — Z20822 Contact with and (suspected) exposure to covid-19: Secondary | ICD-10-CM | POA: Diagnosis not present

## 2021-09-09 DIAGNOSIS — I452 Bifascicular block: Secondary | ICD-10-CM | POA: Diagnosis not present

## 2021-09-09 DIAGNOSIS — R0602 Shortness of breath: Secondary | ICD-10-CM | POA: Diagnosis not present

## 2021-09-09 DIAGNOSIS — M79602 Pain in left arm: Secondary | ICD-10-CM | POA: Diagnosis not present

## 2021-09-09 DIAGNOSIS — M25512 Pain in left shoulder: Secondary | ICD-10-CM | POA: Diagnosis not present

## 2021-09-09 DIAGNOSIS — R001 Bradycardia, unspecified: Secondary | ICD-10-CM | POA: Diagnosis not present

## 2021-09-09 DIAGNOSIS — R079 Chest pain, unspecified: Secondary | ICD-10-CM | POA: Diagnosis not present

## 2021-09-09 DIAGNOSIS — I498 Other specified cardiac arrhythmias: Secondary | ICD-10-CM | POA: Diagnosis not present

## 2021-09-09 DIAGNOSIS — I4510 Unspecified right bundle-branch block: Secondary | ICD-10-CM | POA: Diagnosis not present

## 2021-09-09 DIAGNOSIS — R9431 Abnormal electrocardiogram [ECG] [EKG]: Secondary | ICD-10-CM | POA: Diagnosis not present

## 2021-09-17 DIAGNOSIS — Z6824 Body mass index (BMI) 24.0-24.9, adult: Secondary | ICD-10-CM | POA: Diagnosis not present

## 2021-09-17 DIAGNOSIS — J9801 Acute bronchospasm: Secondary | ICD-10-CM | POA: Diagnosis not present

## 2021-09-17 DIAGNOSIS — I5033 Acute on chronic diastolic (congestive) heart failure: Secondary | ICD-10-CM | POA: Diagnosis not present

## 2021-10-25 IMAGING — DX DG CHEST 2V
2 series · 2 of 2 positions shown · non-contrast
Comparison: 03/17/2021

CLINICAL DATA: Cough

EXAM:
CHEST - 2 VIEW

[chest pa]
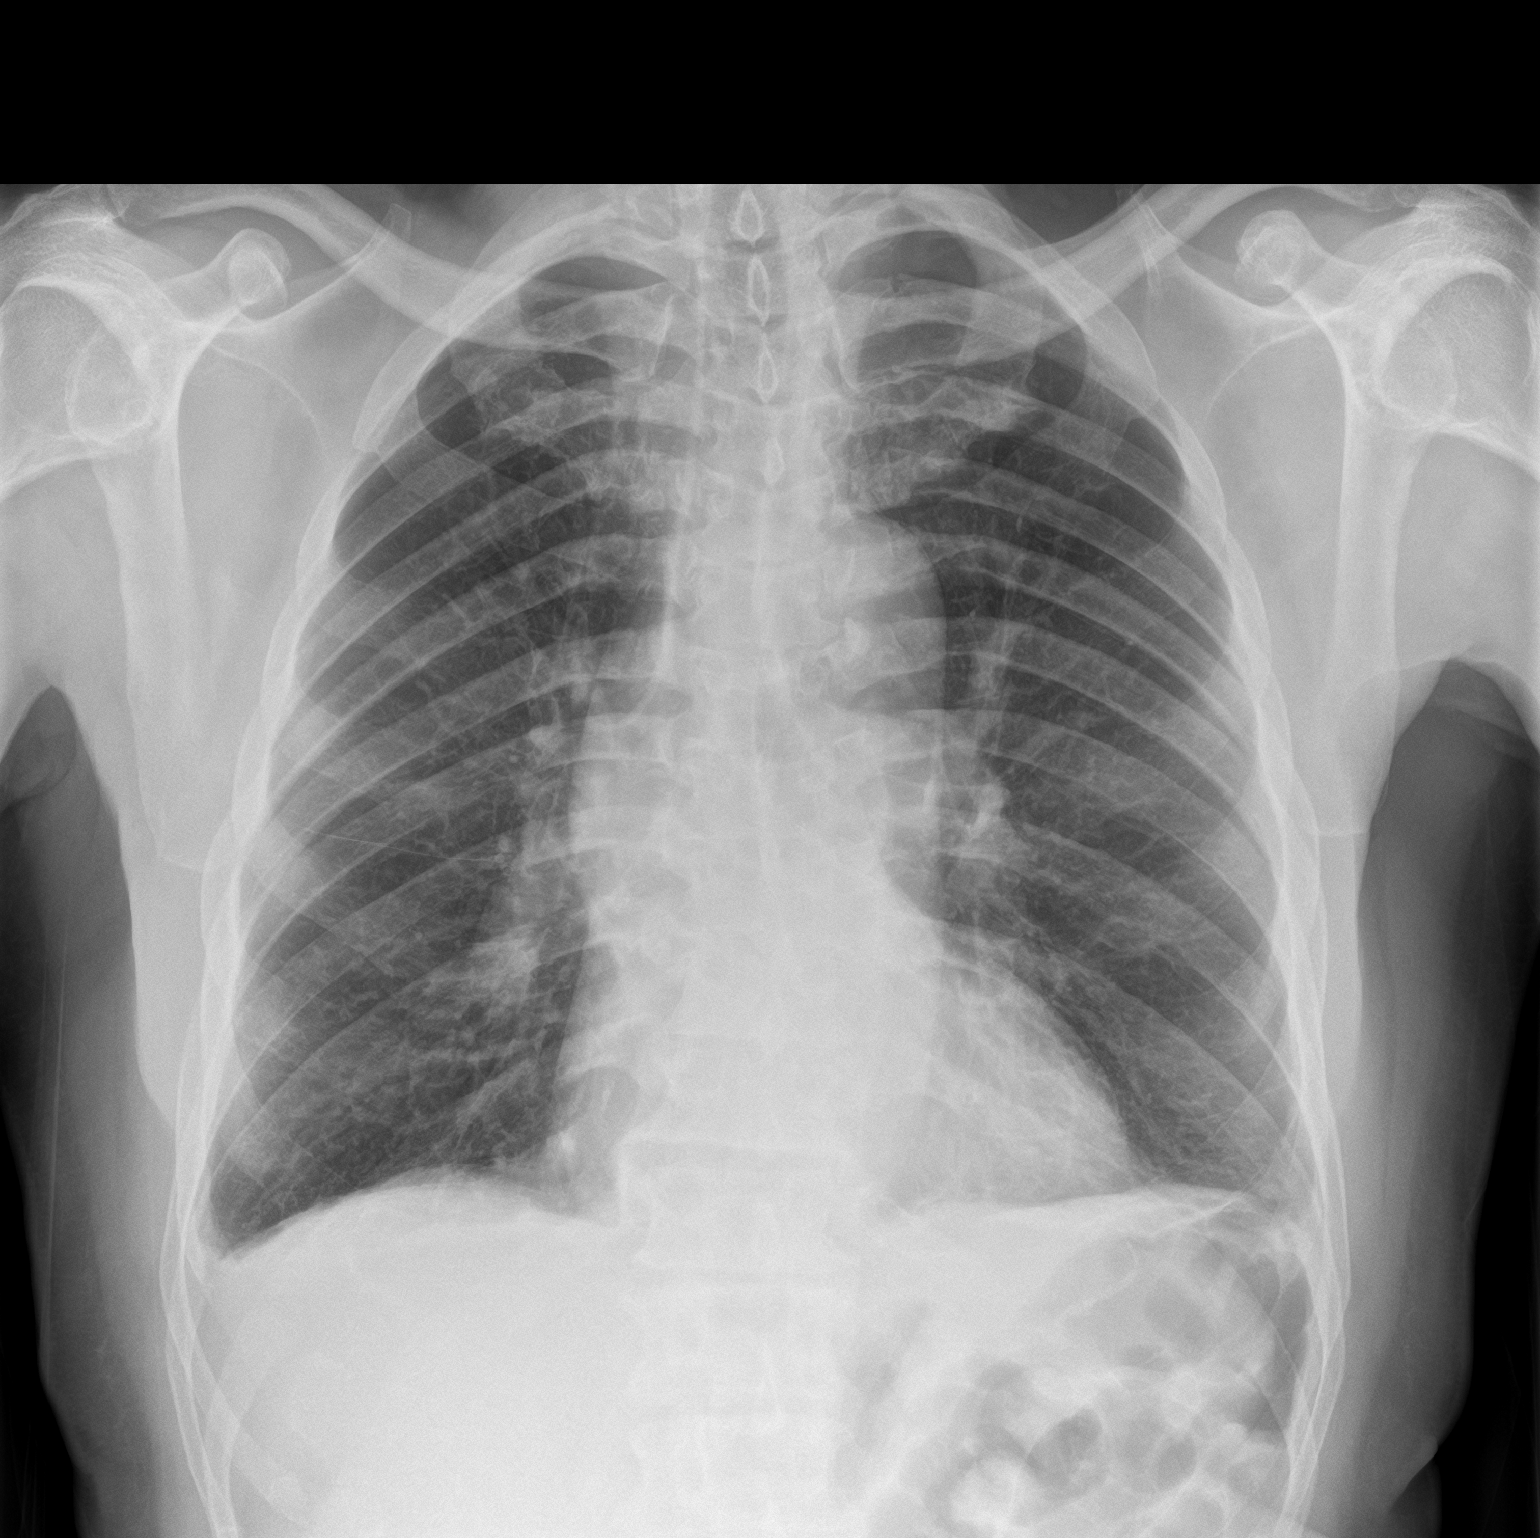

[chest lat]
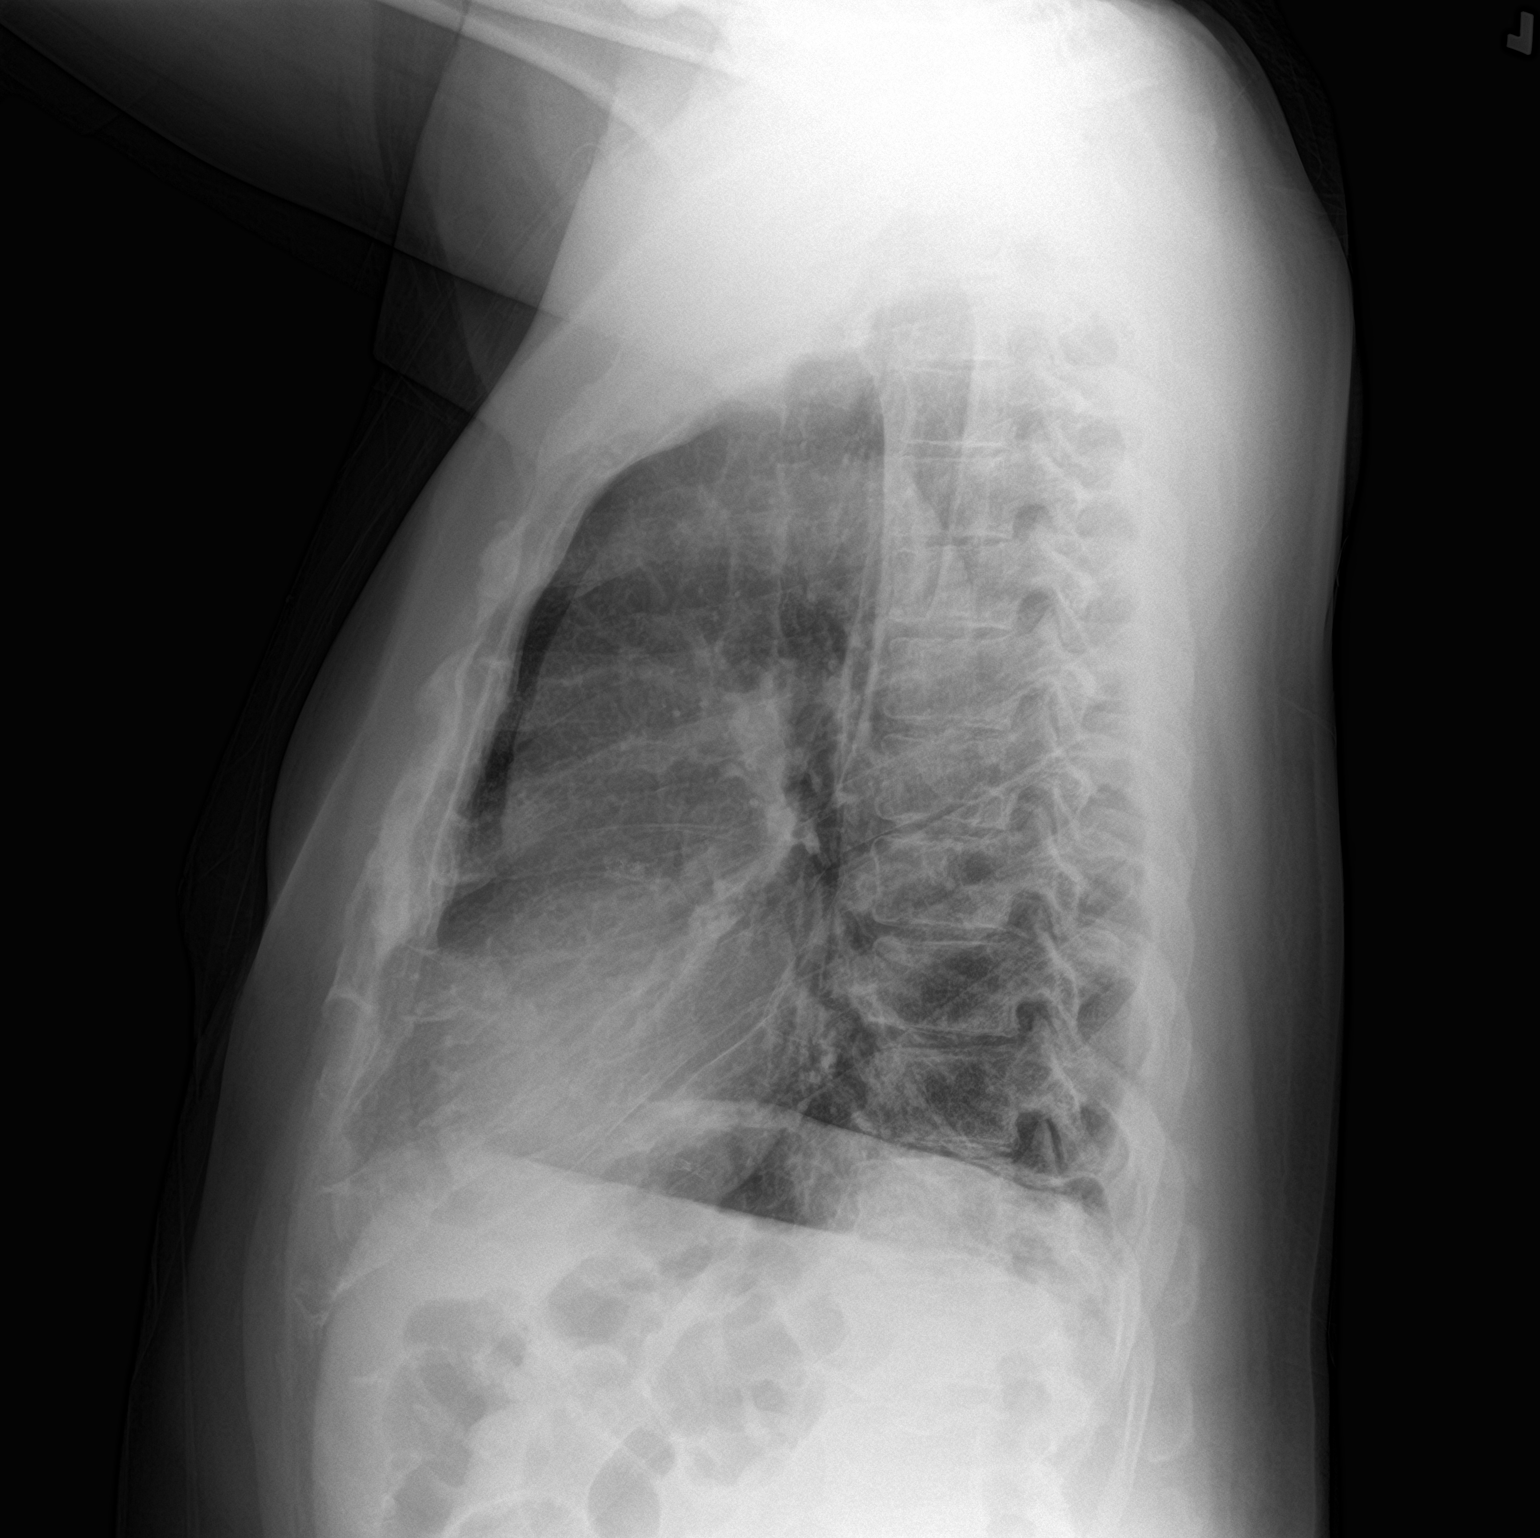

[2 of 2 positions shown; findings below may reference images not displayed]

FINDINGS: The lungs are mildly hyperinflated in keeping with changes of
underlying COPD, stable since prior examination. No superimposed
focal pulmonary infiltrate. No pneumothorax or pleural effusion.
Cardiac size within normal limits. Pulmonary vascularity is normal.
No acute bone abnormality.
IMPRESSION: No active cardiopulmonary disease.  COPD.

## 2021-12-04 ENCOUNTER — Other Ambulatory Visit: Payer: Self-pay | Admitting: Cardiology

## 2021-12-08 IMAGING — DX DG CHEST 1V PORT
1 series · 1 of 1 positions shown · non-contrast
Comparison: 04/19/2021 and prior radiographs

CLINICAL DATA: Cough for several days.

EXAM:
PORTABLE CHEST 1 VIEW

[chest ap]
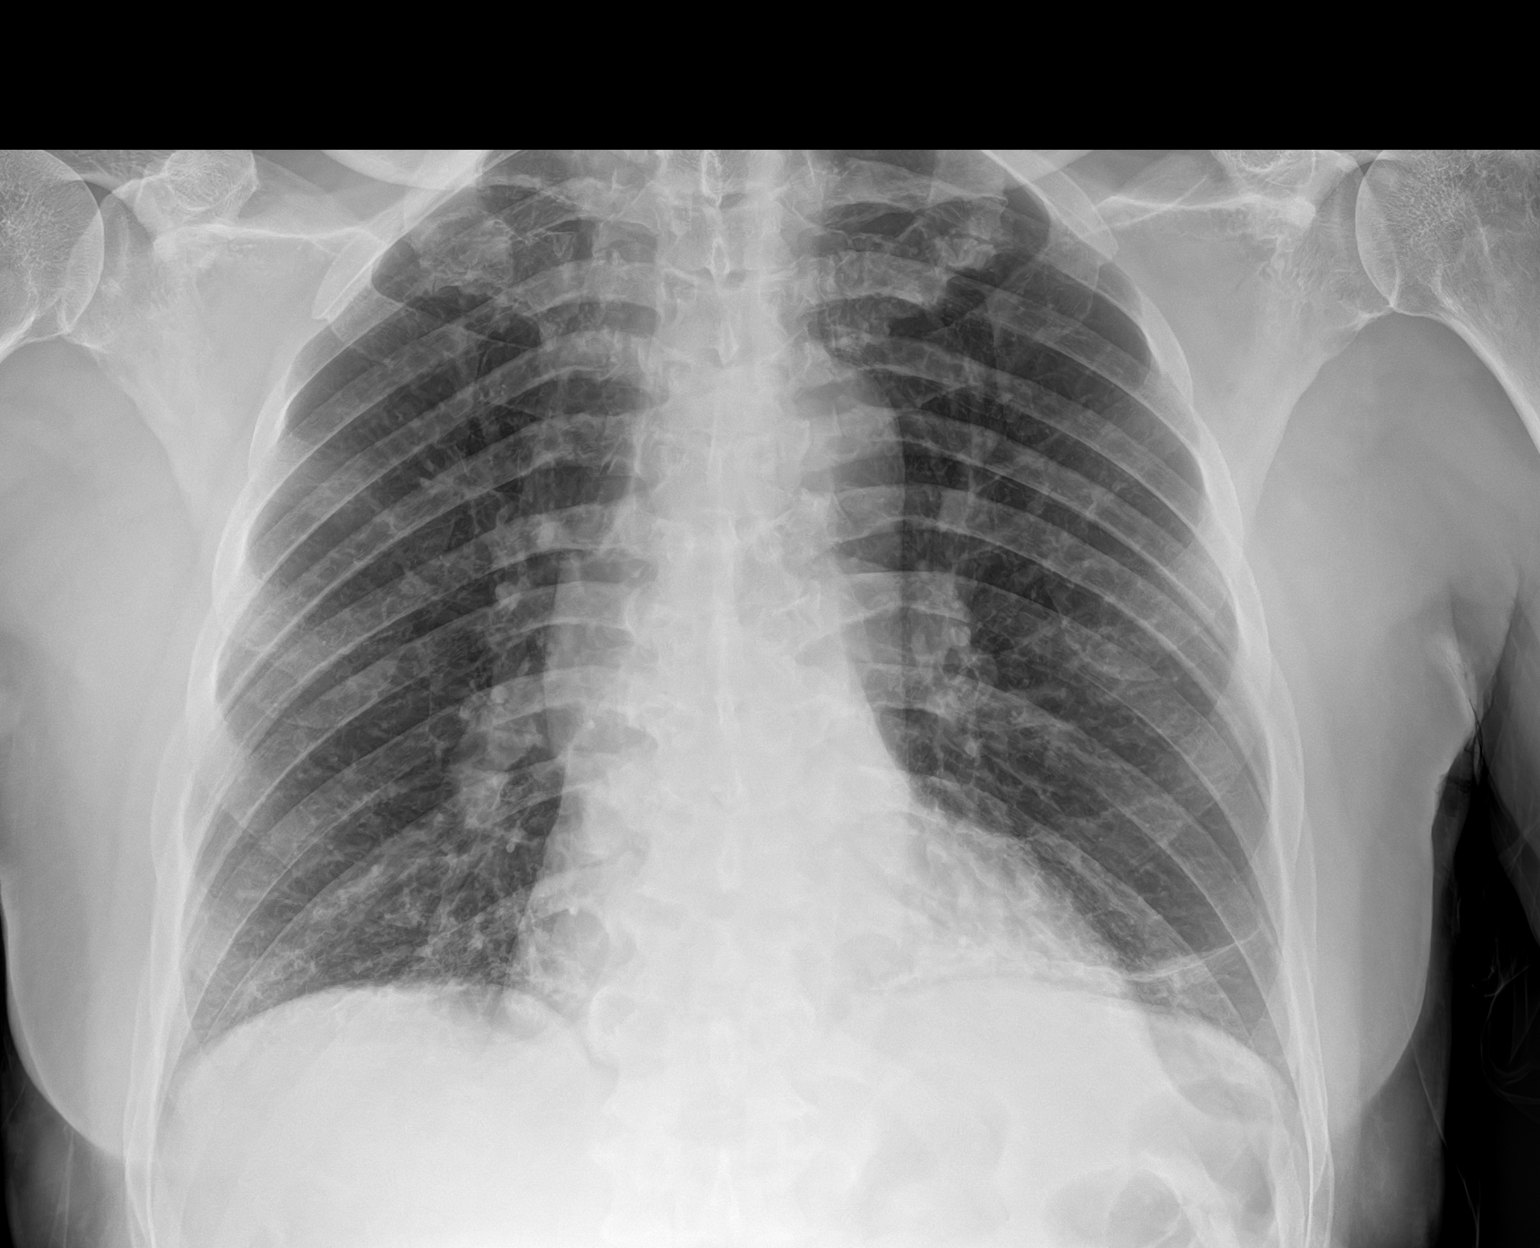

[1 of 1 positions shown; findings below may reference images not displayed]

FINDINGS: The cardiomediastinal silhouette is unremarkable.

Mild subsegmental LEFT basilar atelectasis is now noted.

Mild peribronchial thickening is again noted.

There is no evidence of focal airspace disease, pulmonary edema,
suspicious pulmonary nodule/mass, pleural effusion, or pneumothorax.

No acute bony abnormalities are identified.
IMPRESSION: Mild subsegmental LEFT basilar atelectasis.

## 2021-12-14 DIAGNOSIS — M25531 Pain in right wrist: Secondary | ICD-10-CM | POA: Diagnosis not present

## 2021-12-14 DIAGNOSIS — S6991XA Unspecified injury of right wrist, hand and finger(s), initial encounter: Secondary | ICD-10-CM | POA: Diagnosis not present

## 2021-12-24 DIAGNOSIS — I5033 Acute on chronic diastolic (congestive) heart failure: Secondary | ICD-10-CM | POA: Diagnosis not present

## 2021-12-24 DIAGNOSIS — E782 Mixed hyperlipidemia: Secondary | ICD-10-CM | POA: Diagnosis not present

## 2021-12-24 DIAGNOSIS — Z6824 Body mass index (BMI) 24.0-24.9, adult: Secondary | ICD-10-CM | POA: Diagnosis not present

## 2021-12-24 DIAGNOSIS — I2510 Atherosclerotic heart disease of native coronary artery without angina pectoris: Secondary | ICD-10-CM | POA: Diagnosis not present

## 2021-12-24 DIAGNOSIS — Z0001 Encounter for general adult medical examination with abnormal findings: Secondary | ICD-10-CM | POA: Diagnosis not present

## 2021-12-24 DIAGNOSIS — I10 Essential (primary) hypertension: Secondary | ICD-10-CM | POA: Diagnosis not present

## 2021-12-24 DIAGNOSIS — C61 Malignant neoplasm of prostate: Secondary | ICD-10-CM | POA: Diagnosis not present

## 2021-12-24 DIAGNOSIS — Z1331 Encounter for screening for depression: Secondary | ICD-10-CM | POA: Diagnosis not present

## 2021-12-25 ENCOUNTER — Other Ambulatory Visit: Payer: Self-pay | Admitting: Cardiology

## 2021-12-26 IMAGING — DX DG CHEST 1V PORT
1 series · 1 of 1 positions shown · non-contrast
Comparison: 05/03/2021

CLINICAL DATA: Hemoptysis.  Shortness of breath.  Cough.

EXAM:
PORTABLE CHEST 1 VIEW

[chest ap grid]
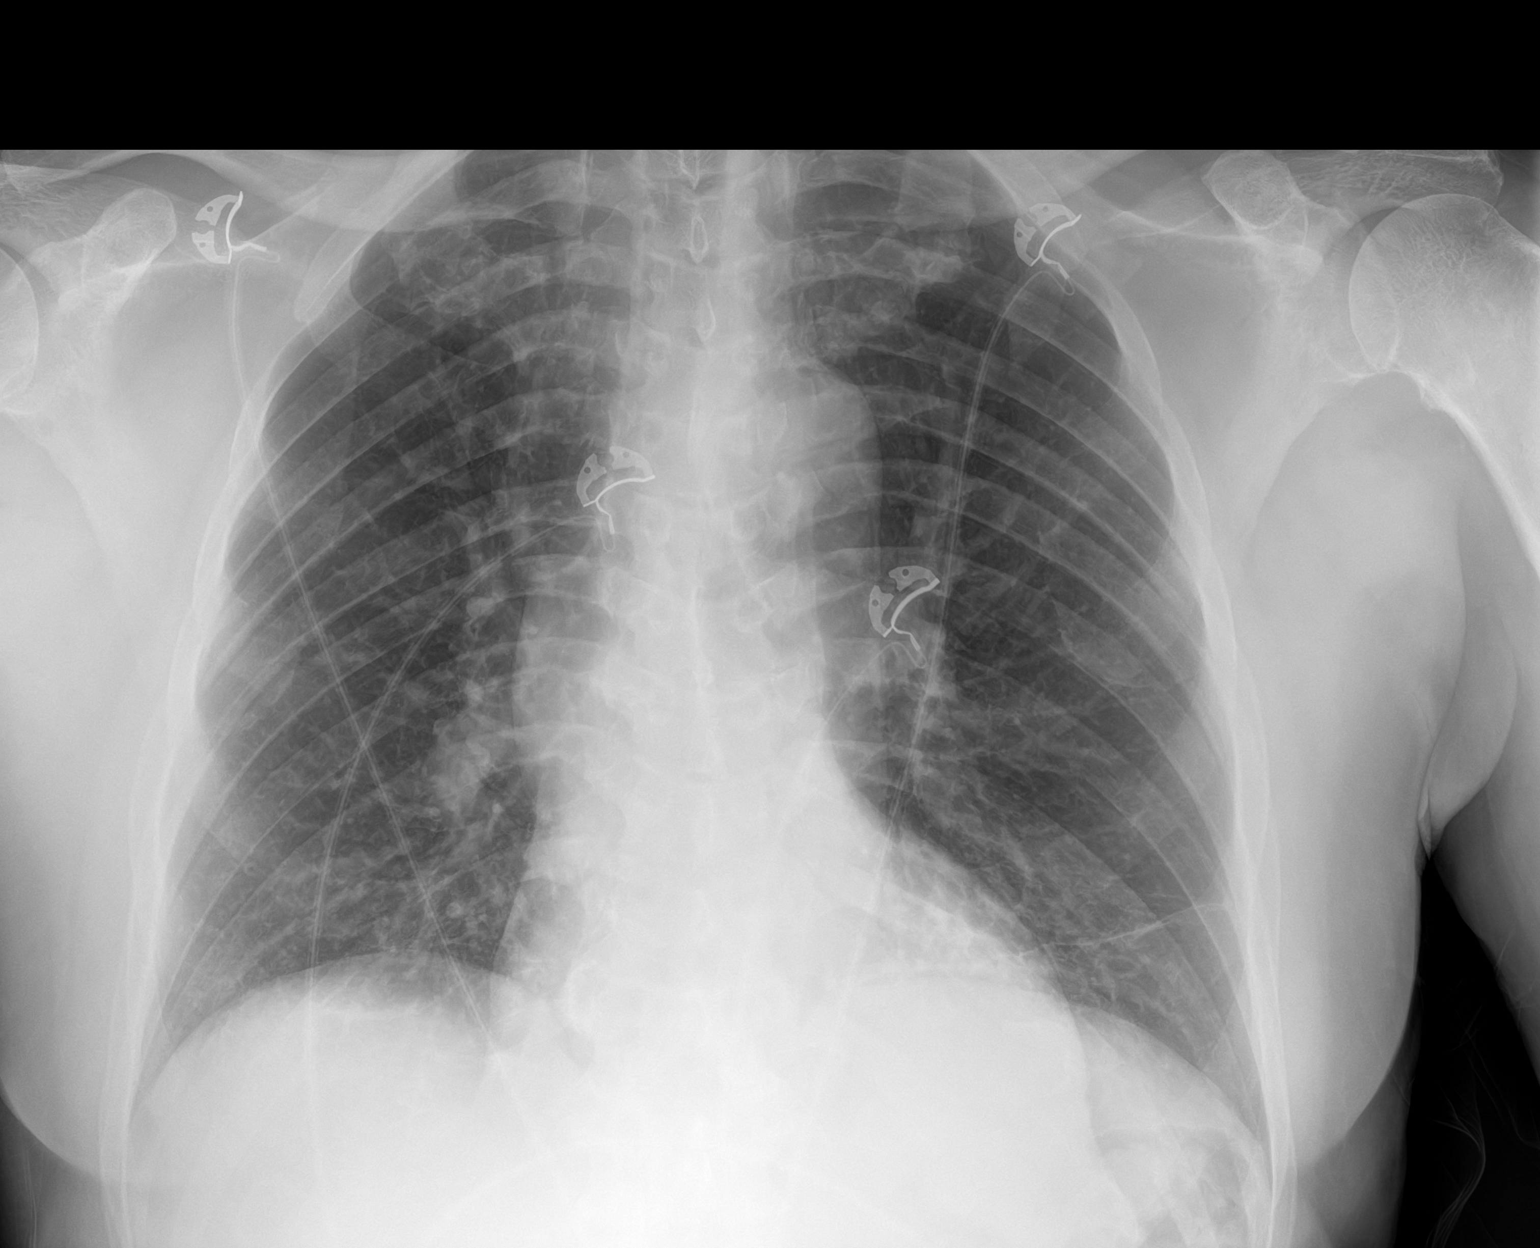

[1 of 1 positions shown; findings below may reference images not displayed]

FINDINGS: Heart size and pulmonary vascularity are normal. Emphysematous
changes in the lungs. Linear scarring in the lung bases. No airspace
disease suggested. No pleural effusions. No pneumothorax.
Mediastinal contours appear intact.
IMPRESSION: Emphysematous changes and fibrosis in the lungs. No focal
consolidation.

## 2022-02-15 DIAGNOSIS — M654 Radial styloid tenosynovitis [de Quervain]: Secondary | ICD-10-CM | POA: Diagnosis not present

## 2022-02-15 DIAGNOSIS — Z6824 Body mass index (BMI) 24.0-24.9, adult: Secondary | ICD-10-CM | POA: Diagnosis not present

## 2022-03-31 ENCOUNTER — Telehealth: Payer: Self-pay | Admitting: Cardiology

## 2022-03-31 MED ORDER — ENTRESTO 97-103 MG PO TABS: 1.0000 | ORAL_TABLET | Freq: Two times a day (BID) | ORAL | 0 refills | Status: DC

## 2022-03-31 NOTE — Telephone Encounter (Signed)
*  STAT* If patient is at the pharmacy, call can be transferred to refill team.   1. Which medications need to be refilled? (please list name of each medication and dose if known) entresto  2. Which pharmacy/location (including street and city if local pharmacy) is medication to be sent to? [id number]- [place], [place] - [address] AT [address]. Do they need a 30 day or 90 day supply? 30 day  Patient will be out Friday.

## 2022-03-31 NOTE — Telephone Encounter (Signed)
Refills has been sent to pharmacy. 

## 2022-04-29 DIAGNOSIS — J0100 Acute maxillary sinusitis, unspecified: Secondary | ICD-10-CM | POA: Diagnosis not present

## 2022-05-02 ENCOUNTER — Other Ambulatory Visit: Payer: Self-pay | Admitting: Cardiology

## 2022-05-25 NOTE — Progress Notes (Unsigned)
Cardiology Office Note   Date:  05/26/2022   ID:  Maanav, Kassabian 11-20-1950, MRN [id number]  PCP:  Redmond School, MD  Cardiologist:   Minus Breeding, MD   Chief Complaint  Patient presents with   Coronary Artery Disease      History of Present Illness: Tyler Chavez is a 71 y.o. male who presents for follow up of CAD with anterior STEMI found at outside hospital on 10/06/2016.  He was urgently transferred to [facility] for emergent cardiac catheterization which revealed total occlusion of the LAD at the ostium, 75% proximal circumflex stenosis, 95% proximal RCA stenosis, and 95% PLA stenosis.  He underwent PCI to his LAD and DES insertion.  The procedure was complicated by hypotension and cardiogenic shock requiring dopamine and fluid resuscitation.  7 days later he underwent staged PCI to his PLA and proximal RCA.  EF improved to 35%.  Heart failure medications have been titrated including Entresto.  He was initially fitted with a LifeVest..  He had an echocardiogram on 04/05/2017 with improvement of his LVEF to 40 to 45%.    Since I last saw him he works part-time at [employer].  The patient denies any new symptoms such as chest discomfort, neck or arm discomfort. There has been no new shortness of breath, PND or orthopnea. There have been no reported palpitations, presyncope or syncope.      Past Medical History:  Diagnosis Date   Coronary artery disease    a. 09/2016: Anterior STEMI with 100% Ost LAD stenosis, 95% Post Atrio stenosis, 90% Prox RCA stenosis, and 75% Ost to Prox Cx stenosis. DES to LAD. Staged DES to Post Atrio and DES to RCA performed later in admission.   ED (erectile dysfunction)    Frequency of urination    GERD (gastroesophageal reflux disease)    Hyperlipidemia    Hypertension    Ischemic cardiomyopathy    a. 09/2016: echo showing EF of 30-35% with Grade 2 DD   Myocardial infarction [facility])    Prostate cancer [facility]) urologist- dr wrenn/  oncologist-  dr  Tammi Klippel   Stage T1c, Gleason 3+3, PSA 8.2, vol 49.9grams   Rash    lower right leg    Past Surgical History:  Procedure Laterality Date   CARDIAC CATHETERIZATION N/A 10/07/2016   Procedure: Left Heart Cath and Coronary Angiography;  Surgeon: Troy Sine, MD;  Location: [place] [facility];  Service: Cardiovascular;  Laterality: N/A;   CARDIAC CATHETERIZATION N/A 10/07/2016   Procedure: Coronary Stent Intervention;  Surgeon: Troy Sine, MD;  Location: [place] CV LAB;  Service: Cardiovascular;  Laterality: N/A;   CARDIAC CATHETERIZATION N/A 10/11/2016   Procedure: Coronary Stent Intervention;  Surgeon: Peter M Martinique, MD;  Location: [place] CV LAB;  Service: Cardiovascular;  Laterality: N/A;   CYSTOSCOPY  02/19/2016   Procedure: CYSTOSCOPY;  Surgeon: Irine Seal, MD;  Location: [facility];  Service: Urology;;  no seeds visualized in bladder   NO PAST SURGERIES     RADIOACTIVE SEED IMPLANT N/A 02/19/2016   Procedure: RADIOACTIVE SEED IMPLANT/BRACHYTHERAPY IMPLANT;  Surgeon: Irine Seal, MD;  Location: [place];  Service: Urology;  Laterality: N/A;  79   seeds implanted     Current Outpatient Medications  Medication Sig Dispense Refill   aspirin EC 81 MG tablet Take 81 mg by mouth daily.     atorvastatin (LIPITOR) 80 MG tablet TAKE 1 TABLET BY MOUTH EVERY DAY 90  tablet 3   calcium carbonate (TUMS - DOSED IN MG ELEMENTAL CALCIUM) 500 MG chewable tablet Chew 1 tablet by mouth as needed for indigestion or heartburn.     carvedilol (COREG) 25 MG tablet TAKE 1 TABLET BY MOUTH TWICE A DAY 180 tablet 1   furosemide (LASIX) 20 MG tablet Take 1 tablet (20 mg total) by mouth daily. (Patient taking differently: Take 20 mg by mouth daily as needed.) 90 tablet 1   Multiple Vitamin (MULTIVITAMIN) tablet Take 1 tablet by mouth daily.     nitroGLYCERIN (NITROSTAT) 0.4 MG SL tablet Place 1 tablet (0.4 mg total) under the tongue every 5 (five) minutes as needed for  chest pain. 25 tablet 4   PROAIR HFA 108 (90 Base) MCG/ACT inhaler Inhale 2 puffs into the lungs as needed.     sacubitril-valsartan (ENTRESTO) 97-103 MG Take 1 tablet by mouth 2 (two) times daily. 180 tablet 0   spironolactone (ALDACTONE) 25 MG tablet TAKE 1/2 TABLET BY MOUTH EVERY DAY 45 tablet 0   tamsulosin (FLOMAX) 0.4 MG CAPS capsule Take 1 capsule (0.4 mg total) by mouth daily. 30 capsule 1   No current facility-administered medications for this visit.    Allergies:   Patient has no known allergies.    ROS:  Please see the history of present illness.   Otherwise, review of systems are positive for none.   All other systems are reviewed and negative.    PHYSICAL EXAM: VS:  BP (!) 138/90   Pulse 63   Ht '6\' 2"'$  (1.88 m)   Wt 190 lb (86.2 kg)   BMI 24.39 kg/m  , BMI Body mass index is 24.39 kg/m. GENERAL:  Well appearing NECK:  No jugular venous distention, waveform within normal limits, carotid upstroke brisk and symmetric, no bruits, no thyromegaly LUNGS:  Clear to auscultation bilaterally CHEST:  Unremarkable HEART:  PMI not displaced or sustained,S1 and S2 within normal limits, no S3, no S4, no clicks, no rubs, no murmurs ABD:  Flat, positive bowel sounds normal in frequency in pitch, no bruits, no rebound, no guarding, no midline pulsatile mass, no hepatomegaly, no splenomegaly EXT:  2 plus pulses throughout, no edema, no cyanosis no clubbing   EKG:  EKG is  ordered today. Sinus rhythm, rate 63, axis within normal limits, intervals within normal limits, old anteroseptal infarct with chronic anterior T wave inversions unchanged from previous.   Recent Labs: No results found for requested labs within last 365 days.    Lipid Panel    Component Value Date/Time   CHOL 108 01/12/2017 0823   TRIG 69 01/12/2017 0823   HDL 41 01/12/2017 0823   CHOLHDL 2.6 01/12/2017 0823   VLDL 14 01/12/2017 0823   LDLCALC 53 01/12/2017 0823      Wt Readings from Last 3 Encounters:   05/26/22 190 lb (86.2 kg)  05/21/21 202 lb (91.6 kg)  05/03/21 202 lb (91.6 kg)      Other studies Reviewed: Additional studies/ records that were reviewed today include: Labs Review of the above records demonstrates: See elsewhere  ASSESSMENT AND PLAN:  CAD The patient has no new sypmtoms.  No further cardiovascular testing is indicated.  We will continue with aggressive risk reduction and meds as listed.  Ischemic cardiomyopathy/Chronic systolic heart failure ([place]) I am going to check his ejection fraction as it has been several years.   Hyperlipidemia, unspecified hyperlipidemia type His goal LDL was 75 with an HDL of 51.  No change  in therapy.  Essential hypertension This is being managed in the context of treating his CHF    Current medicines are reviewed at length with the patient today.  The patient does not have concerns regarding medicines.  The following changes have been made: None  Labs/ tests ordered today include:   Orders Placed This Encounter  Procedures   EKG 12-Lead   ECHOCARDIOGRAM COMPLETE     Disposition:   FU with me in 12 months.    Signed, Minus Breeding, MD  05/26/2022 3:52 PM    [facility]

## 2022-05-26 ENCOUNTER — Ambulatory Visit (INDEPENDENT_AMBULATORY_CARE_PROVIDER_SITE_OTHER): Payer: HMO | Admitting: Cardiology

## 2022-05-26 ENCOUNTER — Encounter: Payer: Self-pay | Admitting: Cardiology

## 2022-05-26 VITALS — BP 138/90 | HR 63 | Ht 74.0 in | Wt 190.0 lb

## 2022-05-26 DIAGNOSIS — E785 Hyperlipidemia, unspecified: Secondary | ICD-10-CM

## 2022-05-26 DIAGNOSIS — I255 Ischemic cardiomyopathy: Secondary | ICD-10-CM | POA: Diagnosis not present

## 2022-05-26 DIAGNOSIS — I10 Essential (primary) hypertension: Secondary | ICD-10-CM | POA: Diagnosis not present

## 2022-05-26 DIAGNOSIS — I25119 Atherosclerotic heart disease of native coronary artery with unspecified angina pectoris: Secondary | ICD-10-CM | POA: Diagnosis not present

## 2022-05-26 NOTE — Patient Instructions (Signed)
Medication Instructions:  The current medical regimen is effective;  continue present plan and medications.  *If you need a refill on your cardiac medications before your next appointment, please call your pharmacy*  Testing/Procedures: Your physician has requested that you have an echocardiogram. Echocardiography is a painless test that uses sound waves to create images of your heart. It provides your doctor with information about the size and shape of your heart and how well your heart's chambers and valves are working. This procedure takes approximately one hour. There are no restrictions for this procedure. This testing will be completed at [facility] and you will be contacted to be scheduled.  Follow-Up: At [facility], you and your health needs are our priority.  As part of our continuing mission to provide you with exceptional heart care, we have created designated Provider Care Teams.  These Care Teams include your primary Cardiologist (physician) and Advanced Practice Providers (APPs -  Physician Assistants and Nurse Practitioners) who all work together to provide you with the care you need, when you need it.  We recommend signing up for the patient portal called "MyChart".  Sign up information is provided on this After Visit Summary.  MyChart is used to connect with patients for Virtual Visits (Telemedicine).  Patients are able to view lab/test results, encounter notes, upcoming appointments, etc.  Non-urgent messages can be sent to your provider as well.   To learn more about what you can do with MyChart, go to [email or web address].    Your next appointment:   1 year(s)  The format for your next appointment:   In Person  Provider:   Minus Breeding, MD     Important Information About Sugar

## 2022-06-03 ENCOUNTER — Ambulatory Visit: Payer: HMO | Attending: Cardiology

## 2022-06-03 DIAGNOSIS — I25119 Atherosclerotic heart disease of native coronary artery with unspecified angina pectoris: Secondary | ICD-10-CM

## 2022-06-03 DIAGNOSIS — I255 Ischemic cardiomyopathy: Secondary | ICD-10-CM | POA: Diagnosis not present

## 2022-06-03 LAB — ECHOCARDIOGRAM COMPLETE
AR max vel: 1.98 cm2
AV Peak grad: 3.7 mmHg
Ao pk vel: 0.96 m/s
Area-P 1/2: 2.79 cm2
Calc EF: 40 %
S' Lateral: 3.67 cm
Single Plane A2C EF: 39.7 %
Single Plane A4C EF: 40.8 %

## 2022-06-08 DIAGNOSIS — K2100 Gastro-esophageal reflux disease with esophagitis, without bleeding: Secondary | ICD-10-CM | POA: Diagnosis not present

## 2022-06-12 ENCOUNTER — Emergency Department (HOSPITAL_COMMUNITY)
Admission: EM | Admit: 2022-06-12 | Discharge: 2022-06-13 | Disposition: A | Discharge status: Home / Self Care | Payer: HMO | Attending: Emergency Medicine | Admitting: Emergency Medicine

## 2022-06-12 DIAGNOSIS — B9789 Other viral agents as the cause of diseases classified elsewhere: Secondary | ICD-10-CM | POA: Diagnosis not present

## 2022-06-12 DIAGNOSIS — I10 Essential (primary) hypertension: Secondary | ICD-10-CM | POA: Diagnosis not present

## 2022-06-12 DIAGNOSIS — I2510 Atherosclerotic heart disease of native coronary artery without angina pectoris: Secondary | ICD-10-CM | POA: Insufficient documentation

## 2022-06-12 DIAGNOSIS — J069 Acute upper respiratory infection, unspecified: Secondary | ICD-10-CM | POA: Insufficient documentation

## 2022-06-12 DIAGNOSIS — Z20822 Contact with and (suspected) exposure to covid-19: Secondary | ICD-10-CM | POA: Diagnosis not present

## 2022-06-12 DIAGNOSIS — Z7982 Long term (current) use of aspirin: Secondary | ICD-10-CM | POA: Diagnosis not present

## 2022-06-12 DIAGNOSIS — Z79899 Other long term (current) drug therapy: Secondary | ICD-10-CM | POA: Insufficient documentation

## 2022-06-12 DIAGNOSIS — R059 Cough, unspecified: Secondary | ICD-10-CM | POA: Diagnosis not present

## 2022-06-12 DIAGNOSIS — Z8546 Personal history of malignant neoplasm of prostate: Secondary | ICD-10-CM | POA: Insufficient documentation

## 2022-06-13 ENCOUNTER — Other Ambulatory Visit: Payer: Self-pay

## 2022-06-13 ENCOUNTER — Encounter (HOSPITAL_COMMUNITY): Payer: Self-pay

## 2022-06-13 ENCOUNTER — Emergency Department (HOSPITAL_COMMUNITY): Payer: HMO

## 2022-06-13 DIAGNOSIS — R059 Cough, unspecified: Secondary | ICD-10-CM | POA: Diagnosis not present

## 2022-06-13 LAB — SARS CORONAVIRUS 2 BY RT PCR: SARS Coronavirus 2 by RT PCR: NEGATIVE

## 2022-06-13 MED ORDER — ALBUTEROL SULFATE HFA 108 (90 BASE) MCG/ACT IN AERS
2.0000 | INHALATION_SPRAY | Freq: Once | RESPIRATORY_TRACT | Status: AC
  Administered 2022-06-13: 2 via RESPIRATORY_TRACT
  Filled 2022-06-13: qty 6.7

## 2022-06-13 NOTE — ED Triage Notes (Signed)
Pt arrived from home via POV w c/o cough abd nasal congestion/ drainage x 2 days

## 2022-06-13 NOTE — ED Provider Notes (Signed)
[facility] EMERGENCY DEPARTMENT Provider Note   CSN: [id number] Arrival date & time: 06/12/22  2351     History  Chief Complaint  Patient presents with   Cough   Nasal Congestion    Tyler Chavez is a 71 y.o. male.  The history is provided by the patient.   Patient w/history of CAD, ischemic cardiomyopathy presents with postnasal drip, congestion and cough.  This started about a day ago.  He reports at first he could feel the postnasal drip which triggers nausea and coughing.  He has had some vomiting with the postnasal drip.  No fevers.  No shortness of breath.  No active chest pain.  He is not a current smoker Past Medical History:  Diagnosis Date   Coronary artery disease    a. 09/2016: Anterior STEMI with 100% Ost LAD stenosis, 95% Post Atrio stenosis, 90% Prox RCA stenosis, and 75% Ost to Prox Cx stenosis. DES to LAD. Staged DES to Post Atrio and DES to RCA performed later in admission.   ED (erectile dysfunction)    Frequency of urination    GERD (gastroesophageal reflux disease)    Hyperlipidemia    Hypertension    Ischemic cardiomyopathy    a. 09/2016: echo showing EF of 30-35% with Grade 2 DD   Myocardial infarction [facility])    Prostate cancer [facility]) urologist- dr wrenn/  oncologist-  dr Tammi Klippel   Stage T1c, Gleason 3+3, PSA 8.2, vol 49.9grams   Rash    lower right leg    Home Medications Prior to Admission medications   Medication Sig Start Date End Date Taking? Authorizing Provider  aspirin EC 81 MG tablet Take 81 mg by mouth daily.    [provider]  atorvastatin (LIPITOR) 80 MG tablet TAKE 1 TABLET BY MOUTH EVERY DAY 12/04/21   Minus Breeding, MD  calcium carbonate (TUMS - DOSED IN MG ELEMENTAL CALCIUM) 500 MG chewable tablet Chew 1 tablet by mouth as needed for indigestion or heartburn.    [provider]  carvedilol (COREG) 25 MG tablet TAKE 1 TABLET BY MOUTH TWICE A DAY 12/25/21   Minus Breeding, MD  furosemide (LASIX) 20 MG tablet Take 1  tablet (20 mg total) by mouth daily. Patient taking differently: Take 20 mg by mouth daily as needed. 01/10/20   Minus Breeding, MD  Multiple Vitamin (MULTIVITAMIN) tablet Take 1 tablet by mouth daily.    [provider]  nitroGLYCERIN (NITROSTAT) 0.4 MG SL tablet Place 1 tablet (0.4 mg total) under the tongue every 5 (five) minutes as needed for chest pain. 07/23/20   Minus Breeding, MD  PROAIR HFA 108 [phone] Base) MCG/ACT inhaler Inhale 2 puffs into the lungs as needed. 10/04/18   [provider]  sacubitril-valsartan (ENTRESTO) 97-103 MG Take 1 tablet by mouth 2 (two) times daily. 03/31/22   Minus Breeding, MD  spironolactone (ALDACTONE) 25 MG tablet TAKE 1/2 TABLET BY MOUTH EVERY DAY 05/03/22   Minus Breeding, MD  tamsulosin (FLOMAX) 0.4 MG CAPS capsule Take 1 capsule (0.4 mg total) by mouth daily. 02/19/16   Irine Seal, MD      Allergies    Patient has no known allergies.    Review of Systems   Review of Systems  Constitutional:  Negative for fever.  Respiratory:  Positive for cough.     Physical Exam Updated Vital Signs BP (!) 163/107   Pulse 79   Temp 97.7 F (36.5 C) (Oral)   Resp 18   SpO2 99%  Physical Exam CONSTITUTIONAL: Well developed/well nourished, no distress HEAD: Normocephalic/atraumatic EYES: EOMI/PERRL ENMT: Mucous membranes moist, nasal congestion, uvula midline, no erythema or exudate NECK: supple no meningeal signs CV: S1/S2 noted, no murmurs/rubs/gallops noted LUNGS: Scattered wheeze bilaterally, no acute distress noted ABDOMEN: sof NEURO: Pt is awake/alert/appropriate, moves all extremitiesx4.  No facial droop.   SKIN: warm, color normal PSYCH: no abnormalities of mood noted, alert and oriented to situation  ED Results / Procedures / Treatments   Labs (all labs ordered are listed, but only abnormal results are displayed) Labs Reviewed  SARS CORONAVIRUS 2 BY RT PCR    EKG None  Radiology DG Chest 2 View  Result Date:  06/13/2022 CLINICAL DATA:  Cough and nasal congestion. EXAM: CHEST - 2 VIEW COMPARISON:  09/09/2021. FINDINGS: The heart size and mediastinal contours are within normal limits. Hyperinflation of the lungs is noted. No consolidation, effusion, or pneumothorax. No acute osseous abnormality. IMPRESSION: No active cardiopulmonary disease. Electronically Signed   By: Brett Fairy M.D.   On: 06/13/2022 01:15    Procedures Procedures    Medications Ordered in ED Medications  albuterol (VENTOLIN HFA) 108 (90 Base) MCG/ACT inhaler 2 puff (2 puffs Inhalation Given 06/13/22 0056)    ED Course/ Medical Decision Making/ A&P                           Medical Decision Making Amount and/or Complexity of Data Reviewed Radiology: ordered.  Risk Prescription drug management.   Patient well-appearing presents with postnasal drip, cough but he is otherwise well-appearing.  I personally reviewed x-ray that is negative, no signs of pneumonia or CHF.  COVID-19 testing reviewed and is negative.  Wheezing has resolved after albuterol was given.  Patient likely has a viral URI that will be self-limited.  Patient is safe for discharge        Final Clinical Impression(s) / ED Diagnoses Final diagnoses:  Viral URI with cough    Rx / DC Orders ED Discharge Orders     None         Ripley Fraise, MD 06/13/22 [phone]

## 2022-06-20 ENCOUNTER — Other Ambulatory Visit: Payer: Self-pay | Admitting: Cardiology

## 2022-06-21 ENCOUNTER — Other Ambulatory Visit: Payer: Self-pay | Admitting: Cardiology

## 2022-07-08 DIAGNOSIS — H04123 Dry eye syndrome of bilateral lacrimal glands: Secondary | ICD-10-CM | POA: Diagnosis not present

## 2022-07-08 DIAGNOSIS — H40033 Anatomical narrow angle, bilateral: Secondary | ICD-10-CM | POA: Diagnosis not present

## 2022-09-27 ENCOUNTER — Other Ambulatory Visit: Payer: Self-pay

## 2022-09-27 ENCOUNTER — Encounter (HOSPITAL_COMMUNITY): Payer: Self-pay | Admitting: Emergency Medicine

## 2022-09-27 ENCOUNTER — Emergency Department (HOSPITAL_COMMUNITY)
Admission: EM | Admit: 2022-09-27 | Discharge: 2022-09-27 | Disposition: A | Discharge status: Home / Self Care | Payer: HMO | Attending: Emergency Medicine | Admitting: Emergency Medicine

## 2022-09-27 DIAGNOSIS — I2510 Atherosclerotic heart disease of native coronary artery without angina pectoris: Secondary | ICD-10-CM | POA: Diagnosis not present

## 2022-09-27 DIAGNOSIS — R112 Nausea with vomiting, unspecified: Secondary | ICD-10-CM | POA: Insufficient documentation

## 2022-09-27 DIAGNOSIS — I10 Essential (primary) hypertension: Secondary | ICD-10-CM | POA: Insufficient documentation

## 2022-09-27 DIAGNOSIS — Z8546 Personal history of malignant neoplasm of prostate: Secondary | ICD-10-CM | POA: Diagnosis not present

## 2022-09-27 DIAGNOSIS — Z7982 Long term (current) use of aspirin: Secondary | ICD-10-CM | POA: Insufficient documentation

## 2022-09-27 DIAGNOSIS — R9431 Abnormal electrocardiogram [ECG] [EKG]: Secondary | ICD-10-CM | POA: Diagnosis not present

## 2022-09-27 LAB — CBC WITH DIFFERENTIAL/PLATELET
Abs Immature Granulocytes: 0 10*3/uL (ref 0.00–0.07)
Basophils Absolute: 0 10*3/uL (ref 0.0–0.1)
Basophils Relative: 1 %
Eosinophils Absolute: 0.2 10*3/uL (ref 0.0–0.5)
Eosinophils Relative: 5 %
HCT: 39 % (ref 39.0–52.0)
Hemoglobin: 13.1 g/dL (ref 13.0–17.0)
Immature Granulocytes: 0 %
Lymphocytes Relative: 32 %
Lymphs Abs: 1.1 10*3/uL (ref 0.7–4.0)
MCH: 32.8 pg (ref 26.0–34.0)
MCHC: 33.6 g/dL (ref 30.0–36.0)
MCV: 97.5 fL (ref 80.0–100.0)
Monocytes Absolute: 0.4 10*3/uL (ref 0.1–1.0)
Monocytes Relative: 11 %
Neutro Abs: 1.9 10*3/uL (ref 1.7–7.7)
Neutrophils Relative %: 51 %
Platelets: 143 10*3/uL — ABNORMAL LOW (ref 150–400)
RBC: 4 MIL/uL — ABNORMAL LOW (ref 4.22–5.81)
RDW: 12.9 % (ref 11.5–15.5)
WBC: 3.6 10*3/uL — ABNORMAL LOW (ref 4.0–10.5)
nRBC: 0 % (ref 0.0–0.2)

## 2022-09-27 LAB — COMPREHENSIVE METABOLIC PANEL
ALT: 30 U/L (ref 0–44)
AST: 25 U/L (ref 15–41)
Albumin: 3.6 g/dL (ref 3.5–5.0)
Alkaline Phosphatase: 67 U/L (ref 38–126)
Anion gap: 6 (ref 5–15)
BUN: 22 mg/dL (ref 8–23)
CO2: 29 mmol/L (ref 22–32)
Calcium: 9.1 mg/dL (ref 8.9–10.3)
Chloride: 104 mmol/L (ref 98–111)
Creatinine, Ser: 0.7 mg/dL (ref 0.61–1.24)
GFR, Estimated: >60 mL/min (ref >60)
Glucose, Bld: 109 mg/dL — ABNORMAL HIGH (ref 70–99)
Potassium: 3.9 mmol/L (ref 3.5–5.1)
Sodium: 139 mmol/L (ref 135–145)
Total Bilirubin: 0.8 mg/dL (ref 0.3–1.2)
Total Protein: 6.2 g/dL — ABNORMAL LOW (ref 6.5–8.1)

## 2022-09-27 LAB — LIPASE, BLOOD: Lipase: 35 U/L (ref 11–51)

## 2022-09-27 MED ORDER — ONDANSETRON 4 MG PO TBDP
4.0000 mg | ORAL_TABLET | Freq: Once | ORAL | Status: AC
  Administered 2022-09-27: 4 mg via ORAL
  Filled 2022-09-27: qty 1

## 2022-09-27 MED ORDER — ONDANSETRON 4 MG PO TBDP: 4.0000 mg | ORAL_TABLET | Freq: Three times a day (TID) | ORAL | 0 refills | Status: AC | PRN

## 2022-09-27 NOTE — ED Provider Notes (Signed)
[facility] EMERGENCY DEPARTMENT Provider Note   CSN: [id number] Arrival date & time: 09/27/22  0220     History  Chief Complaint  Patient presents with   Hematemesis    Tyler Chavez is a 72 y.o. male.  HPI     This is a 72 year old male who presents with nausea and vomiting.  Patient reports that he had some nausea earlier this evening.  He vomited once.  He subsequently had a second episode of emesis and noted streaky blood in the emesis.  He has not had any further vomiting.  Denies abdominal pain but does report some persistent nausea.  No recent illnesses or fevers.  Denies chest pain or shortness of breath.  He got concerned when he saw streaks of blood.  He denies any NSAID or alcohol use.  He does take a baby aspirin daily.  Home Medications Prior to Admission medications   Medication Sig Start Date End Date Taking? Authorizing Provider  ondansetron (ZOFRAN-ODT) 4 MG disintegrating tablet Take 1 tablet (4 mg total) by mouth every 8 (eight) hours as needed for nausea or vomiting. 09/27/22  Yes Jajaira Ruis, Barbette Hair, MD  aspirin EC 81 MG tablet Take 81 mg by mouth daily.    [provider]  atorvastatin (LIPITOR) 80 MG tablet TAKE 1 TABLET BY MOUTH EVERY DAY 12/04/21   Minus Breeding, MD  calcium carbonate (TUMS - DOSED IN MG ELEMENTAL CALCIUM) 500 MG chewable tablet Chew 1 tablet by mouth as needed for indigestion or heartburn.    [provider]  carvedilol (COREG) 25 MG tablet TAKE 1 TABLET BY MOUTH TWICE A DAY 06/21/22   Minus Breeding, MD  furosemide (LASIX) 20 MG tablet Take 1 tablet (20 mg total) by mouth daily. Patient taking differently: Take 20 mg by mouth daily as needed. 01/10/20   Minus Breeding, MD  Multiple Vitamin (MULTIVITAMIN) tablet Take 1 tablet by mouth daily.    [provider]  nitroGLYCERIN (NITROSTAT) 0.4 MG SL tablet Place 1 tablet (0.4 mg total) under the tongue every 5 (five) minutes as needed for chest pain. 07/23/20   Minus Breeding, MD  PROAIR HFA 108 [phone] Base) MCG/ACT inhaler Inhale 2 puffs into the lungs as needed. 10/04/18   [provider]  sacubitril-valsartan (ENTRESTO) 97-103 MG TAKE 1 TABLET BY MOUTH TWICE A DAY 06/22/22   Minus Breeding, MD  spironolactone (ALDACTONE) 25 MG tablet TAKE 1/2 TABLET BY MOUTH DAILY 06/22/22   Minus Breeding, MD  tamsulosin (FLOMAX) 0.4 MG CAPS capsule Take 1 capsule (0.4 mg total) by mouth daily. 02/19/16   Irine Seal, MD      Allergies    Patient has no known allergies.    Review of Systems   Review of Systems  Constitutional:  Negative for appetite change and fever.  Gastrointestinal:  Positive for nausea and vomiting. Negative for abdominal pain and diarrhea.  All other systems reviewed and are negative.   Physical Exam Updated Vital Signs BP 122/84 (BP Location: Right Arm)   Pulse 68   Temp 97.8 F (36.6 C) (Oral)   Resp 18   Ht 1.88 m ('6\' 2"'$ )   Wt 90.7 kg   SpO2 96%   BMI 25.68 kg/m  Physical Exam Vitals and nursing note reviewed.  Constitutional:      Appearance: He is well-developed.     Comments: Elderly, non-ill-appearing  HENT:     Head: Normocephalic and atraumatic.  Eyes:     Pupils: Pupils are equal,  round, and reactive to light.  Cardiovascular:     Rate and Rhythm: Normal rate and regular rhythm.     Heart sounds: Normal heart sounds.  Pulmonary:     Effort: Pulmonary effort is normal. No respiratory distress.     Breath sounds: Normal breath sounds. No wheezing.  Abdominal:     General: Bowel sounds are normal.     Palpations: Abdomen is soft.     Tenderness: There is no abdominal tenderness. There is no guarding or rebound.  Musculoskeletal:     Cervical back: Neck supple.  Lymphadenopathy:     Cervical: No cervical adenopathy.  Skin:    General: Skin is warm and dry.  Neurological:     Mental Status: He is alert and oriented to person, place, and time.  Psychiatric:        Mood and Affect: Mood normal.     ED  Results / Procedures / Treatments   Labs (all labs ordered are listed, but only abnormal results are displayed) Labs Reviewed  CBC WITH DIFFERENTIAL/PLATELET - Abnormal; Notable for the following components:      Result Value   WBC 3.6 (*)    RBC 4.00 (*)    Platelets 143 (*)    All other components within normal limits  COMPREHENSIVE METABOLIC PANEL - Abnormal; Notable for the following components:   Glucose, Bld 109 (*)    Total Protein 6.2 (*)    All other components within normal limits  LIPASE, BLOOD    EKG EKG Interpretation  Date/Time:  Monday September 27 2022 03:29:25 EST Ventricular Rate:  57 PR Interval:  196 QRS Duration: 108 QT Interval:  411 QTC Calculation: 401 R Axis:   -69 Text Interpretation: Sinus rhythm Probable left atrial enlargement Incomplete RBBB and LAFB Probable anterior infarct, age indeterminate No significant change since last tracing Confirmed by Thayer Jew [phone]) on 09/27/2022 3:58:31 AM  Radiology No results found.  Procedures Procedures    Medications Ordered in ED Medications  ondansetron (ZOFRAN-ODT) disintegrating tablet 4 mg (has no administration in time range)    ED Course/ Medical Decision Making/ A&P                           Medical Decision Making Amount and/or Complexity of Data Reviewed Labs: ordered.  Risk Prescription drug management.   This patient presents to the ED for concern of nausea and vomiting, this involves an extensive number of treatment options, and is a complaint that carries with it a high risk of complications and morbidity.  I considered the following differential and admission for this acute, potentially life threatening condition.  The differential diagnosis includes gastritis, gastroenteritis, Mallory-Weiss tear, atypical ACS, pancreatitis, cholecystitis  MDM:    This is a 72 year old male who presents with nausea and emesis.  Second episode of emesis streaked with blood.  This concerned  him.  He is overall nontoxic and vital signs are reassuring.  Denies abdominal pain.  His physical exam is benign.  Denies risk factors that would put him at risk for upper GI bleed such as alcohol abuse or significant NSAID use.  Labs obtained.  Hemoglobin is normal which is reassuring.  LFTs and lipase are normal as well.  EKG does not show any evidence of acute ischemia.  Do not feel that his nausea is an anginal equivalent.  Patient was given Zofran.  Suspect that he may have a small Mallory-Weiss tear from vomiting.  Will discharge with some Zofran.  (Labs, imaging, consults)  Labs: I Ordered, and personally interpreted labs.  The pertinent results include: CBC, CMP, lipase  Imaging Studies ordered: I ordered imaging studies including none I independently visualized and interpreted imaging. I agree with the radiologist interpretation  Additional history obtained from chart review.  External records from outside source obtained and reviewed including prior evaluations  Cardiac Monitoring: The patient was maintained on a cardiac monitor.  I personally viewed and interpreted the cardiac monitored which showed an underlying rhythm of: Sinus rhythm  Reevaluation: After the interventions noted above, I reevaluated the patient and found that they have :improved  Social Determinants of Health:  lives independently  Disposition: Discharge  Co morbidities that complicate the patient evaluation  Past Medical History:  Diagnosis Date   Coronary artery disease    a. 09/2016: Anterior STEMI with 100% Ost LAD stenosis, 95% Post Atrio stenosis, 90% Prox RCA stenosis, and 75% Ost to Prox Cx stenosis. DES to LAD. Staged DES to Post Atrio and DES to RCA performed later in admission.   ED (erectile dysfunction)    Frequency of urination    GERD (gastroesophageal reflux disease)    Hyperlipidemia    Hypertension    Ischemic cardiomyopathy    a. 09/2016: echo showing EF of 30-35% with Grade 2 DD    Myocardial infarction [facility])    Prostate cancer [facility]) urologist- dr wrenn/  oncologist-  dr Tammi Klippel   Stage T1c, Gleason 3+3, PSA 8.2, vol 49.9grams   Rash    lower right leg     Medicines Meds ordered this encounter  Medications   ondansetron (ZOFRAN-ODT) disintegrating tablet 4 mg   ondansetron (ZOFRAN-ODT) 4 MG disintegrating tablet    Sig: Take 1 tablet (4 mg total) by mouth every 8 (eight) hours as needed for nausea or vomiting.    Dispense:  20 tablet    Refill:  0    I have reviewed the patients home medicines and have made adjustments as needed  Problem List / ED Course: Problem List Items Addressed This Visit   None Visit Diagnoses     Nausea and vomiting, unspecified vomiting type    -  Primary                   Final Clinical Impression(s) / ED Diagnoses Final diagnoses:  Nausea and vomiting, unspecified vomiting type    Rx / DC Orders ED Discharge Orders          Ordered    ondansetron (ZOFRAN-ODT) 4 MG disintegrating tablet  Every 8 hours PRN        09/27/22 0359              Merryl Hacker, MD 09/27/22 [phone]

## 2022-09-27 NOTE — Discharge Instructions (Signed)
You were seen today for nausea and vomiting.  Your workup is reassuring.  Take medications as prescribed.  Your lab tests are reassuring and you do not have any evidence of anemia.  Avoid anti-inflammatory medications and alcohol.

## 2022-09-27 NOTE — ED Triage Notes (Signed)
Pt states he had two episodes of vomiting this morning and noticed some bright blood in his emesis. C/O nausea but no other symptoms. Here for further evaluation.

## 2022-10-03 NOTE — Progress Notes (Signed)
Cardiology Office Note   Date:  10/06/2022   ID:  Tyler Chavez, DOB [birth year], MRN [id number]  PCP:  Elfredia Nevins, MD  Cardiologist:   Rollene Rotunda, MD   Chief Complaint  Patient presents with   Cardiomyopathy   Coronary Artery Disease      History of Present Illness: Tyler Chavez is a 72 y.o. male who presents for follow up of CAD with anterior STEMI found at outside hospital on 10/06/2016.  He was urgently transferred to [facility] for emergent cardiac catheterization which revealed total occlusion of the LAD at the ostium, 75% proximal circumflex stenosis, 95% proximal RCA stenosis, and 95% PLA stenosis.  He underwent PCI to his LAD and DES insertion.  The procedure was complicated by hypotension and cardiogenic shock requiring dopamine and fluid resuscitation.  7 days later he underwent staged PCI to his PLA and proximal RCA.  EF improved to 35%.  Heart failure medications have been titrated including Entresto.  He was initially fitted with a LifeVest..  He had an echocardiogram on 04/05/2017 with improvement of his LVEF to 40 to 45%.   Last saw him he is doing relatively well.  He does have occasional pain in his calf muscle.  This seems to be at rest and goes away spontaneously.  He is not necessarily describing classic claudication.  He has not had any new shortness of breath, PND or orthopnea.  He has had none of the lower left chest pain that was his previous angina back at the time of his MI.  He had no palpitations, presyncope or syncope.   Past Medical History:  Diagnosis Date   Coronary artery disease    a. 09/2016: Anterior STEMI with 100% Ost LAD stenosis, 95% Post Atrio stenosis, 90% Prox RCA stenosis, and 75% Ost to Prox Cx stenosis. DES to LAD. Staged DES to Post Atrio and DES to RCA performed later in admission.   ED (erectile dysfunction)    Frequency of urination    GERD (gastroesophageal reflux disease)    Hyperlipidemia    Hypertension    Ischemic  cardiomyopathy    a. 09/2016: echo showing EF of 30-35% with Grade 2 DD   Myocardial infarction [facility])    Prostate cancer [facility]) urologist- dr wrenn/  oncologist-  dr Kathrynn Running   Stage T1c, Gleason 3+3, PSA 8.2, vol 49.9grams   Rash    lower right leg    Past Surgical History:  Procedure Laterality Date   CARDIAC CATHETERIZATION N/A 10/07/2016   Procedure: Left Heart Cath and Coronary Angiography;  Surgeon: Lennette Bihari, MD;  Location: [facility] INVASIVE CV LAB;  Service: Cardiovascular;  Laterality: N/A;   CARDIAC CATHETERIZATION N/A 10/07/2016   Procedure: Coronary Stent Intervention;  Surgeon: Lennette Bihari, MD;  Location: [facility] INVASIVE CV LAB;  Service: Cardiovascular;  Laterality: N/A;   CARDIAC CATHETERIZATION N/A 10/11/2016   Procedure: Coronary Stent Intervention;  Surgeon: Peter M Swaziland, MD;  Location: [facility] INVASIVE CV LAB;  Service: Cardiovascular;  Laterality: N/A;   CYSTOSCOPY  02/19/2016   Procedure: CYSTOSCOPY;  Surgeon: Bjorn Pippin, MD;  Location: [facility] [place];  Service: Urology;;  no seeds visualized in bladder   NO PAST SURGERIES     RADIOACTIVE SEED IMPLANT N/A 02/19/2016   Procedure: RADIOACTIVE SEED IMPLANT/BRACHYTHERAPY IMPLANT;  Surgeon: Bjorn Pippin, MD;  Location: [facility] ;  Service: Urology;  Laterality: N/A;  79   seeds implanted     Current Outpatient Medications  Medication Sig Dispense Refill   aspirin EC 81 MG tablet Take 81 mg by mouth daily.     atorvastatin (LIPITOR) 80 MG tablet TAKE 1 TABLET BY MOUTH EVERY DAY 90 tablet 3   calcium carbonate (TUMS - DOSED IN MG ELEMENTAL CALCIUM) 500 MG chewable tablet Chew 1 tablet by mouth as needed for indigestion or heartburn.     carvedilol (COREG) 25 MG tablet TAKE 1 TABLET BY MOUTH TWICE A DAY 180 tablet 3   furosemide (LASIX) 20 MG tablet Take 1 tablet (20 mg total) by mouth daily. (Patient taking differently: Take 20 mg by mouth daily as needed.) 90 tablet 1   Multiple Vitamin (MULTIVITAMIN)  tablet Take 1 tablet by mouth daily.     nitroGLYCERIN (NITROSTAT) 0.4 MG SL tablet Place 1 tablet (0.4 mg total) under the tongue every 5 (five) minutes as needed for chest pain. 25 tablet 4   ondansetron (ZOFRAN-ODT) 4 MG disintegrating tablet Take 1 tablet (4 mg total) by mouth every 8 (eight) hours as needed for nausea or vomiting. 20 tablet 0   PROAIR HFA 108 (90 Base) MCG/ACT inhaler Inhale 2 puffs into the lungs as needed.     sacubitril-valsartan (ENTRESTO) 97-103 MG TAKE 1 TABLET BY MOUTH TWICE A DAY 180 tablet 3   spironolactone (ALDACTONE) 25 MG tablet Take 1 tablet (25 mg total) by mouth daily. 90 tablet 3   tamsulosin (FLOMAX) 0.4 MG CAPS capsule Take 1 capsule (0.4 mg total) by mouth daily. 30 capsule 1   No current facility-administered medications for this visit.    Allergies:   Patient has no known allergies.    ROS:  Please see the history of present illness.   Otherwise, review of systems are positive for none.   All other systems are reviewed and negative.    PHYSICAL EXAM: VS:  BP 128/80   Pulse 70   Ht 6\' 2"  (1.88 m)   Wt 187 lb (84.8 kg)   BMI 24.01 kg/m  , BMI Body mass index is 24.01 kg/m. GENERAL:  Well appearing NECK:  No jugular venous distention, waveform within normal limits, carotid upstroke brisk and symmetric, no bruits, no thyromegaly LUNGS:  Clear to auscultation bilaterally CHEST:  Unremarkable HEART:  PMI not displaced or sustained,S1 and S2 within normal limits, no S3, no S4, no clicks, no rubs, no murmurs ABD:  Flat, positive bowel sounds normal in frequency in pitch, no bruits, no rebound, no guarding, no midline pulsatile mass, no hepatomegaly, no splenomegaly EXT:  2 plus pulses upper and decreased dorsalis pedis and posterior tibialis bilateral lower, no edema, no cyanosis no clubbing  EKG:  EKG is not   ordered today. Sinus rhythm, rate 57, axis within normal limits, intervals within normal limits, old anteroseptal infarct with chronic  anterior T wave inversions unchanged from previous.  09/27/22   Recent Labs: 09/27/2022: ALT 30; BUN 22; Creatinine, Ser 0.70; Hemoglobin 13.1; Platelets 143; Potassium 3.9; Sodium 139    Lipid Panel    Component Value Date/Time   CHOL 108 01/12/2017 0823   TRIG 69 01/12/2017 0823   HDL 41 01/12/2017 0823   CHOLHDL 2.6 01/12/2017 0823   VLDL 14 01/12/2017 0823   LDLCALC 53 01/12/2017 0823      Wt Readings from Last 3 Encounters:  10/06/22 187 lb (84.8 kg)  09/27/22 200 lb (90.7 kg)  05/26/22 190 lb (86.2 kg)      Other studies Reviewed: Additional studies/ records that were reviewed today  include: Labs Review of the above records demonstrates: See elsewhere  ASSESSMENT AND PLAN:  CAD The patient has no new sypmtoms.  No further cardiovascular testing is indicated.  We will continue with aggressive risk reduction and meds as listed.  Ischemic cardiomyopathy/Chronic systolic heart failure (HCC) I am going to increase his spironolactone to 25 mg daily and check a basic metabolic profile in 2 weeks.  Hyperlipidemia, unspecified hyperlipidemia type His goal LDL was 75 with an HDL of 51.  Will get a fasting lipid profile when he returns  Essential hypertension This is being managed in the context of managing his cardiomyopathy.   Leg pain He does have this and some decreased pulses so I will check ABIs.  Current medicines are reviewed at length with the patient today.  The patient does not have concerns regarding medicines.  The following changes have been made: As above  Labs/ tests ordered today include:   Orders Placed This Encounter  Procedures   US ARTERIAL LOWER EXTREMITY DUPLEX BILATERAL   Basic metabolic panel   Lipid panel     Disposition:   FU with me in 12 months.    Signed, Rollene Rotunda, MD  10/06/2022 1:53 PM    [facility]

## 2022-10-06 ENCOUNTER — Other Ambulatory Visit: Payer: Self-pay | Admitting: *Deleted

## 2022-10-06 ENCOUNTER — Encounter: Payer: Self-pay | Admitting: Cardiology

## 2022-10-06 ENCOUNTER — Ambulatory Visit (INDEPENDENT_AMBULATORY_CARE_PROVIDER_SITE_OTHER): Payer: PPO | Admitting: Cardiology

## 2022-10-06 VITALS — BP 128/80 | HR 70 | Ht 74.0 in | Wt 187.0 lb

## 2022-10-06 DIAGNOSIS — M79606 Pain in leg, unspecified: Secondary | ICD-10-CM

## 2022-10-06 DIAGNOSIS — E785 Hyperlipidemia, unspecified: Secondary | ICD-10-CM | POA: Diagnosis not present

## 2022-10-06 DIAGNOSIS — I2510 Atherosclerotic heart disease of native coronary artery without angina pectoris: Secondary | ICD-10-CM

## 2022-10-06 DIAGNOSIS — Z79899 Other long term (current) drug therapy: Secondary | ICD-10-CM

## 2022-10-06 DIAGNOSIS — I10 Essential (primary) hypertension: Secondary | ICD-10-CM

## 2022-10-06 MED ORDER — SPIRONOLACTONE 25 MG PO TABS: 25.0000 mg | ORAL_TABLET | Freq: Every day | ORAL | 3 refills | Status: DC

## 2022-10-06 NOTE — Patient Instructions (Signed)
Medication Instructions:  Please increase your Spironolactone to 25 mg a day. Continue all other medications as listed.  *If you need a refill on your cardiac medications before your next appointment, please call your pharmacy*   Lab Work: Please have fasting blood work in 2 weeks (Lipid, BMP).  This may be completed at any [employer] or your primary care doctor.  If you have labs (blood work) drawn today and your tests are completely normal, you will receive your results only by: [place] (if you have MyChart) OR A paper copy in the mail If you have any lab test that is abnormal or we need to change your treatment, we will call you to review the results.   Testing/Procedures: Your physician has requested that you have a lower extremity arterial exercise duplex. During this test, exercise and ultrasound are used to evaluate arterial blood flow in the legs. Allow one hour for this exam. There are no restrictions or special instructions. You will be contacted to be scheduled for this appt.   Follow-Up: At [facility], you and your health needs are our priority.  As part of our continuing mission to provide you with exceptional heart care, we have created designated Provider Care Teams.  These Care Teams include your primary Cardiologist (physician) and Advanced Practice Providers (APPs -  Physician Assistants and Nurse Practitioners) who all work together to provide you with the care you need, when you need it.  We recommend signing up for the patient portal called "MyChart".  Sign up information is provided on this After Visit Summary.  MyChart is used to connect with patients for Virtual Visits (Telemedicine).  Patients are able to view lab/test results, encounter notes, upcoming appointments, etc.  Non-urgent messages can be sent to your provider as well.   To learn more about what you can do with MyChart, go to [email or web address].    Your next appointment:   6  month(s)  The format for your next appointment:   In Person  Provider:   Minus Breeding, MD     Important Information About [employer]

## 2022-10-11 ENCOUNTER — Ambulatory Visit (HOSPITAL_COMMUNITY)
Admission: RE | Admit: 2022-10-11 | Discharge: 2022-10-11 | Disposition: A | Discharge status: Home / Self Care | Payer: PPO | Source: Ambulatory Visit | Attending: Cardiology | Admitting: Cardiology

## 2022-10-11 DIAGNOSIS — M79605 Pain in left leg: Secondary | ICD-10-CM | POA: Diagnosis not present

## 2022-10-11 DIAGNOSIS — I743 Embolism and thrombosis of arteries of the lower extremities: Secondary | ICD-10-CM | POA: Diagnosis not present

## 2022-10-11 DIAGNOSIS — M79606 Pain in leg, unspecified: Secondary | ICD-10-CM | POA: Diagnosis not present

## 2022-10-11 DIAGNOSIS — M79604 Pain in right leg: Secondary | ICD-10-CM | POA: Diagnosis not present

## 2022-10-15 ENCOUNTER — Encounter: Payer: Self-pay | Admitting: *Deleted

## 2022-10-21 DIAGNOSIS — Z79899 Other long term (current) drug therapy: Secondary | ICD-10-CM | POA: Diagnosis not present

## 2022-10-21 DIAGNOSIS — I10 Essential (primary) hypertension: Secondary | ICD-10-CM | POA: Diagnosis not present

## 2022-10-21 DIAGNOSIS — E785 Hyperlipidemia, unspecified: Secondary | ICD-10-CM | POA: Diagnosis not present

## 2022-10-22 LAB — BASIC METABOLIC PANEL
BUN/Creatinine Ratio: 16 (ref 10–24)
BUN: 14 mg/dL (ref 8–27)
CO2: 27 mmol/L (ref 20–29)
Calcium: 9.8 mg/dL (ref 8.6–10.2)
Chloride: 102 mmol/L (ref 96–106)
Creatinine, Ser: 0.86 mg/dL (ref 0.76–1.27)
Glucose: 111 mg/dL — ABNORMAL HIGH (ref 70–99)
Potassium: 4.7 mmol/L (ref 3.5–5.2)
Sodium: 141 mmol/L (ref 134–144)
eGFR: 93 mL/min/{1.73_m2} (ref >59)

## 2022-10-22 LAB — LIPID PANEL
Chol/HDL Ratio: 2.6 ratio (ref 0.0–5.0)
Cholesterol, Total: 133 mg/dL (ref 100–199)
HDL: 52 mg/dL (ref >39)
LDL Chol Calc (NIH): 69 mg/dL (ref 0–99)
Triglycerides: 53 mg/dL (ref 0–149)
VLDL Cholesterol Cal: 12 mg/dL (ref 5–40)

## 2022-10-27 DIAGNOSIS — I10 Essential (primary) hypertension: Secondary | ICD-10-CM | POA: Diagnosis not present

## 2022-10-27 DIAGNOSIS — R42 Dizziness and giddiness: Secondary | ICD-10-CM | POA: Diagnosis not present

## 2022-10-27 DIAGNOSIS — H10023 Other mucopurulent conjunctivitis, bilateral: Secondary | ICD-10-CM | POA: Diagnosis not present

## 2022-10-27 DIAGNOSIS — I2510 Atherosclerotic heart disease of native coronary artery without angina pectoris: Secondary | ICD-10-CM | POA: Diagnosis not present

## 2022-11-03 DIAGNOSIS — Z1331 Encounter for screening for depression: Secondary | ICD-10-CM | POA: Diagnosis not present

## 2022-11-03 DIAGNOSIS — I509 Heart failure, unspecified: Secondary | ICD-10-CM | POA: Diagnosis not present

## 2022-11-03 DIAGNOSIS — Z0001 Encounter for general adult medical examination with abnormal findings: Secondary | ICD-10-CM | POA: Diagnosis not present

## 2022-11-03 DIAGNOSIS — E559 Vitamin D deficiency, unspecified: Secondary | ICD-10-CM | POA: Diagnosis not present

## 2022-11-03 DIAGNOSIS — E782 Mixed hyperlipidemia: Secondary | ICD-10-CM | POA: Diagnosis not present

## 2022-11-03 DIAGNOSIS — R7309 Other abnormal glucose: Secondary | ICD-10-CM | POA: Diagnosis not present

## 2022-11-03 DIAGNOSIS — I10 Essential (primary) hypertension: Secondary | ICD-10-CM | POA: Diagnosis not present

## 2022-11-03 DIAGNOSIS — C61 Malignant neoplasm of prostate: Secondary | ICD-10-CM | POA: Diagnosis not present

## 2022-11-03 DIAGNOSIS — D518 Other vitamin B12 deficiency anemias: Secondary | ICD-10-CM | POA: Diagnosis not present

## 2022-11-03 DIAGNOSIS — Z122 Encounter for screening for malignant neoplasm of respiratory organs: Secondary | ICD-10-CM | POA: Diagnosis not present

## 2022-11-03 DIAGNOSIS — E039 Hypothyroidism, unspecified: Secondary | ICD-10-CM | POA: Diagnosis not present

## 2022-11-03 DIAGNOSIS — Z6823 Body mass index (BMI) 23.0-23.9, adult: Secondary | ICD-10-CM | POA: Diagnosis not present

## 2022-11-03 DIAGNOSIS — Z125 Encounter for screening for malignant neoplasm of prostate: Secondary | ICD-10-CM | POA: Diagnosis not present

## 2022-11-04 ENCOUNTER — Other Ambulatory Visit (HOSPITAL_COMMUNITY): Payer: Self-pay | Admitting: Urology

## 2022-11-04 ENCOUNTER — Other Ambulatory Visit: Payer: Self-pay | Admitting: Internal Medicine

## 2022-11-04 DIAGNOSIS — Z122 Encounter for screening for malignant neoplasm of respiratory organs: Secondary | ICD-10-CM

## 2022-11-06 ENCOUNTER — Other Ambulatory Visit: Payer: Self-pay | Admitting: Cardiology

## 2022-11-15 DIAGNOSIS — Z1211 Encounter for screening for malignant neoplasm of colon: Secondary | ICD-10-CM | POA: Diagnosis not present

## 2022-12-14 ENCOUNTER — Ambulatory Visit (HOSPITAL_COMMUNITY)
Admission: RE | Admit: 2022-12-14 | Discharge: 2022-12-14 | Disposition: A | Discharge status: Home / Self Care | Payer: PPO | Source: Ambulatory Visit | Attending: Internal Medicine | Admitting: Internal Medicine

## 2022-12-14 DIAGNOSIS — I2510 Atherosclerotic heart disease of native coronary artery without angina pectoris: Secondary | ICD-10-CM | POA: Diagnosis not present

## 2022-12-14 DIAGNOSIS — Z122 Encounter for screening for malignant neoplasm of respiratory organs: Secondary | ICD-10-CM | POA: Diagnosis not present

## 2022-12-14 DIAGNOSIS — J439 Emphysema, unspecified: Secondary | ICD-10-CM | POA: Diagnosis not present

## 2022-12-14 DIAGNOSIS — Z87891 Personal history of nicotine dependence: Secondary | ICD-10-CM | POA: Insufficient documentation

## 2022-12-14 DIAGNOSIS — I700 Atherosclerosis of aorta: Secondary | ICD-10-CM | POA: Insufficient documentation

## 2022-12-26 DIAGNOSIS — I10 Essential (primary) hypertension: Secondary | ICD-10-CM | POA: Diagnosis not present

## 2022-12-26 DIAGNOSIS — I2510 Atherosclerotic heart disease of native coronary artery without angina pectoris: Secondary | ICD-10-CM | POA: Diagnosis not present

## 2023-02-23 ENCOUNTER — Encounter (HOSPITAL_COMMUNITY): Payer: Self-pay | Admitting: Emergency Medicine

## 2023-02-23 ENCOUNTER — Emergency Department (HOSPITAL_COMMUNITY)
Admission: EM | Admit: 2023-02-23 | Discharge: 2023-02-23 | Disposition: A | Discharge status: Home / Self Care | Payer: PPO | Attending: Emergency Medicine | Admitting: Emergency Medicine

## 2023-02-23 ENCOUNTER — Other Ambulatory Visit: Payer: Self-pay

## 2023-02-23 ENCOUNTER — Emergency Department (HOSPITAL_COMMUNITY): Payer: PPO

## 2023-02-23 DIAGNOSIS — R001 Bradycardia, unspecified: Secondary | ICD-10-CM | POA: Diagnosis not present

## 2023-02-23 DIAGNOSIS — R0789 Other chest pain: Secondary | ICD-10-CM | POA: Diagnosis not present

## 2023-02-23 DIAGNOSIS — Z79899 Other long term (current) drug therapy: Secondary | ICD-10-CM | POA: Diagnosis not present

## 2023-02-23 DIAGNOSIS — I10 Essential (primary) hypertension: Secondary | ICD-10-CM | POA: Insufficient documentation

## 2023-02-23 DIAGNOSIS — Z7982 Long term (current) use of aspirin: Secondary | ICD-10-CM | POA: Diagnosis not present

## 2023-02-23 DIAGNOSIS — I2510 Atherosclerotic heart disease of native coronary artery without angina pectoris: Secondary | ICD-10-CM | POA: Diagnosis not present

## 2023-02-23 DIAGNOSIS — R042 Hemoptysis: Secondary | ICD-10-CM | POA: Diagnosis not present

## 2023-02-23 DIAGNOSIS — R059 Cough, unspecified: Secondary | ICD-10-CM | POA: Diagnosis not present

## 2023-02-23 MED ORDER — AZITHROMYCIN 250 MG PO TABS
500.0000 mg | ORAL_TABLET | Freq: Once | ORAL | Status: AC
  Administered 2023-02-23: 500 mg via ORAL
  Filled 2023-02-23: qty 2

## 2023-02-23 MED ORDER — BENZONATATE 100 MG PO CAPS: 100.0000 mg | ORAL_CAPSULE | Freq: Three times a day (TID) | ORAL | 0 refills | Status: AC

## 2023-02-23 MED ORDER — ALBUTEROL SULFATE HFA 108 (90 BASE) MCG/ACT IN AERS: 2.0000 | INHALATION_SPRAY | RESPIRATORY_TRACT | Status: DC | PRN

## 2023-02-23 MED ORDER — AZITHROMYCIN 250 MG PO TABS: 250.0000 mg | ORAL_TABLET | Freq: Every day | ORAL | 0 refills | Status: DC

## 2023-02-23 NOTE — Discharge Instructions (Addendum)
Begin taking Zithromax as prescribed.  Begin taking Tessalon as prescribed as needed for cough.  Follow-up with primary doctor if not improving in the next few days.

## 2023-02-23 NOTE — ED Triage Notes (Signed)
Pt via POV c/o cough since about 9pm with chest discomfort and possible hemoptysis. Hx mitral valve regurgitation.

## 2023-02-23 NOTE — ED Provider Notes (Addendum)
[place] EMERGENCY DEPARTMENT AT [facility] Provider Note   CSN: [id number] Arrival date & time: 02/23/23  0043     History  Chief Complaint  Patient presents with   Cough    Tyler Chavez is a 72 y.o. male.  Patient is a 72 year old male with past medical history of coronary artery disease with stent, hypertension, ischemic cardiomyopathy, hyperlipidemia.  Patient presenting today with complaints of cough.  This started several days ago and is worsening.  This evening he coughed so hard that he noticed a small amount of blood in his sputum.  He denies any fevers or chills.  He denies any ill contacts.  He denies to me he is having any chest pain.  The history is provided by the patient.       Home Medications Prior to Admission medications   Medication Sig Start Date End Date Taking? Authorizing Provider  aspirin EC 81 MG tablet Take 81 mg by mouth daily.    [provider]  atorvastatin (LIPITOR) 80 MG tablet TAKE 1 TABLET BY MOUTH EVERY DAY 11/08/22   Rollene Rotunda, MD  calcium carbonate (TUMS - DOSED IN MG ELEMENTAL CALCIUM) 500 MG chewable tablet Chew 1 tablet by mouth as needed for indigestion or heartburn.    [provider]  carvedilol (COREG) 25 MG tablet TAKE 1 TABLET BY MOUTH TWICE A DAY 06/21/22   Rollene Rotunda, MD  furosemide (LASIX) 20 MG tablet Take 1 tablet (20 mg total) by mouth daily. Patient taking differently: Take 20 mg by mouth daily as needed. 01/10/20   Rollene Rotunda, MD  Multiple Vitamin (MULTIVITAMIN) tablet Take 1 tablet by mouth daily.    [provider]  nitroGLYCERIN (NITROSTAT) 0.4 MG SL tablet Place 1 tablet (0.4 mg total) under the tongue every 5 (five) minutes as needed for chest pain. 07/23/20   Rollene Rotunda, MD  ondansetron (ZOFRAN-ODT) 4 MG disintegrating tablet Take 1 tablet (4 mg total) by mouth every 8 (eight) hours as needed for nausea or vomiting. 09/27/22   Horton, Mayer Masker, MD  PROAIR HFA 108  [phone] Base) MCG/ACT inhaler Inhale 2 puffs into the lungs as needed. 10/04/18   [provider]  sacubitril-valsartan (ENTRESTO) 97-103 MG TAKE 1 TABLET BY MOUTH TWICE A DAY 06/22/22   Rollene Rotunda, MD  spironolactone (ALDACTONE) 25 MG tablet Take 1 tablet (25 mg total) by mouth daily. 10/06/22   Rollene Rotunda, MD  tamsulosin (FLOMAX) 0.4 MG CAPS capsule Take 1 capsule (0.4 mg total) by mouth daily. 02/19/16   Bjorn Pippin, MD      Allergies    Patient has no known allergies.    Review of Systems   Review of Systems  All other systems reviewed and are negative.   Physical Exam Updated Vital Signs BP (!) 157/87 (BP Location: Right Arm)   Pulse 77   Temp 98.4 F (36.9 C) (Oral)   Resp (!) 22   Ht 6\' 2"  (1.88 m)   Wt 86.2 kg   SpO2 91%   BMI 24.39 kg/m  Physical Exam Vitals and nursing note reviewed.  Constitutional:      General: He is not in acute distress.    Appearance: He is well-developed. He is not diaphoretic.  HENT:     Head: Normocephalic and atraumatic.  Cardiovascular:     Rate and Rhythm: Normal rate and regular rhythm.     Heart sounds: No murmur heard.    No friction rub.  Pulmonary:  Effort: Pulmonary effort is normal. No respiratory distress.     Breath sounds: Normal breath sounds. No wheezing or rales.  Abdominal:     General: Bowel sounds are normal. There is no distension.     Palpations: Abdomen is soft.     Tenderness: There is no abdominal tenderness.  Musculoskeletal:        General: Normal range of motion.     Cervical back: Normal range of motion and neck supple.  Skin:    General: Skin is warm and dry.  Neurological:     Mental Status: He is alert and oriented to person, place, and time.     Coordination: Coordination normal.     ED Results / Procedures / Treatments   Labs (all labs ordered are listed, but only abnormal results are displayed) Labs Reviewed - No data to display  EKG EKG  Interpretation  Date/Time:  Wednesday Feb 23 2023 00:50:54 EDT Ventricular Rate:  58 PR Interval:  192 QRS Duration: 100 QT Interval:  374 QTC Calculation: 367 R Axis:   -67 Text Interpretation: Sinus bradycardia with sinus arrhythmia Left anterior fascicular block Anterior infarct , age undetermined Abnormal ECG Confirmed by Geoffery Lyons ([id number]) on 02/23/2023 1:24:53 AM  Radiology DG Chest 2 View  Result Date: 02/23/2023 CLINICAL DATA:  Cough EXAM: CHEST - 2 VIEW COMPARISON:  06/13/2022 FINDINGS: Heart and mediastinal contours within normal limits. Minimal left basilar opacity, favor atelectasis. No confluent opacity on the right. No effusions or acute bony abnormality. IMPRESSION: Left basilar opacity, likely atelectasis. Electronically Signed   By: Charlett Nose M.D.   On: 02/23/2023 01:15    Procedures Procedures    Medications Ordered in ED Medications  albuterol (VENTOLIN HFA) 108 (90 Base) MCG/ACT inhaler 2 puff (has no administration in time range)  azithromycin (ZITHROMAX) tablet 500 mg (has no administration in time range)    ED Course/ Medical Decision Making/ A&P  Patient presenting here with complaints of cough with a small amount of hemoptysis as described in the HPI.  Patient arrives here with stable vital signs and is afebrile.  There is no hypoxia.  He is in no respiratory distress.  Chest x-ray obtained showing atelectasis, but no obvious pneumonia.  Patient will be treated for acute bronchitis with Zithromax and Tessalon.  To follow-up as needed.  Patient is experiencing no chest pain and this in no way sounds cardiac.  Final Clinical Impression(s) / ED Diagnoses Final diagnoses:  None    Rx / DC Orders ED Discharge Orders     None         Geoffery Lyons, MD 02/23/23 6045    Geoffery Lyons, MD 02/23/23 [phone]

## 2023-02-24 DIAGNOSIS — Z6824 Body mass index (BMI) 24.0-24.9, adult: Secondary | ICD-10-CM | POA: Diagnosis not present

## 2023-02-24 DIAGNOSIS — J9801 Acute bronchospasm: Secondary | ICD-10-CM | POA: Diagnosis not present

## 2023-02-24 DIAGNOSIS — I10 Essential (primary) hypertension: Secondary | ICD-10-CM | POA: Diagnosis not present

## 2023-03-27 DIAGNOSIS — I10 Essential (primary) hypertension: Secondary | ICD-10-CM | POA: Diagnosis not present

## 2023-03-27 DIAGNOSIS — E039 Hypothyroidism, unspecified: Secondary | ICD-10-CM | POA: Diagnosis not present

## 2023-03-27 DIAGNOSIS — E782 Mixed hyperlipidemia: Secondary | ICD-10-CM | POA: Diagnosis not present

## 2023-03-27 DIAGNOSIS — I5033 Acute on chronic diastolic (congestive) heart failure: Secondary | ICD-10-CM | POA: Diagnosis not present

## 2023-04-26 NOTE — Progress Notes (Unsigned)
  Cardiology Office Note:   Date:  04/27/2023  ID:  Tyler Chavez, DOB 1951/04/30, MRN [id number] PCP: Elfredia Nevins, MD  [facility] Providers Cardiologist:  Rollene Rotunda, MD {  History of Present Illness:   Tyler Chavez is a 72 y.o. male who presents for follow up of CAD with anterior STEMI found at outside hospital on 10/06/2016.  He was urgently transferred to [facility] for emergent cardiac catheterization which revealed total occlusion of the LAD at the ostium, 75% proximal circumflex stenosis, 95% proximal RCA stenosis, and 95% PLA stenosis.  He underwent PCI to his LAD and DES insertion.  The procedure was complicated by hypotension and cardiogenic shock requiring dopamine and fluid resuscitation.  7 days later he underwent staged PCI to his PLA and proximal RCA.  EF improved to 35%.  Heart failure medications have been titrated including Entresto.  He was initially fitted with a LifeVest..  He had an echocardiogram on 04/05/2017 with improvement of his LVEF to 40 to 45%.   At the last visit I increased the spironolactone.    Since I last saw him he has done well. The patient denies any new symptoms such as chest discomfort, neck or arm discomfort. There has been no new shortness of breath, PND or orthopnea. There have been no reported palpitations, presyncope or syncope.    ROS: As stated in the HPI and negative for all other systems.  Studies Reviewed:    EKG:   NA  Risk Assessment/Calculations:              Physical Exam:   VS:  BP 114/69   Pulse 70   Ht 6\' 2"  (1.88 m)   Wt 196 lb 9.6 oz (89.2 kg)   SpO2 95%   BMI 25.24 kg/m    Wt Readings from Last 3 Encounters:  04/27/23 196 lb 9.6 oz (89.2 kg)  02/23/23 190 lb (86.2 kg)  10/06/22 187 lb (84.8 kg)     GEN: Well nourished, well developed in no acute distress NECK: No JVD; No carotid bruits CARDIAC: RRR, no murmurs, rubs, gallops RESPIRATORY:  Clear to auscultation without rales, wheezing or rhonchi  ABDOMEN:  Soft, non-tender, non-distended EXTREMITIES:  No edema; No deformity   ASSESSMENT AND PLAN:   CAD The patient has no new sypmtoms.  No further cardiovascular testing is indicated.  We will continue with aggressive risk reduction and meds as listed.  Ischemic cardiomyopathy/Chronic systolic heart failure (HCC) He is on optimal medical therapy limited by his blood pressure.  No change in therapy.  He has class I symptoms.    Hyperlipidemia, unspecified hyperlipidemia type His goal LDL was 63 with an HDL of 59.  No change in therapy.   Essential hypertension This is being managed in the context of treating his CHF    Leg pain He had only mildly abnormal ABIs.         Follow up me in one year.   Signed, Rollene Rotunda, MD

## 2023-04-27 ENCOUNTER — Encounter: Payer: Self-pay | Admitting: Cardiology

## 2023-04-27 ENCOUNTER — Ambulatory Visit (INDEPENDENT_AMBULATORY_CARE_PROVIDER_SITE_OTHER): Payer: PPO | Admitting: Cardiology

## 2023-04-27 VITALS — BP 114/69 | HR 70 | Ht 74.0 in | Wt 196.6 lb

## 2023-04-27 DIAGNOSIS — E785 Hyperlipidemia, unspecified: Secondary | ICD-10-CM

## 2023-04-27 DIAGNOSIS — I255 Ischemic cardiomyopathy: Secondary | ICD-10-CM | POA: Diagnosis not present

## 2023-04-27 DIAGNOSIS — I2510 Atherosclerotic heart disease of native coronary artery without angina pectoris: Secondary | ICD-10-CM

## 2023-04-27 NOTE — Patient Instructions (Signed)

## 2023-05-23 ENCOUNTER — Other Ambulatory Visit: Payer: Self-pay | Admitting: Cardiology

## 2023-06-18 ENCOUNTER — Other Ambulatory Visit: Payer: Self-pay | Admitting: Cardiology

## 2023-07-25 ENCOUNTER — Telehealth: Payer: Self-pay | Admitting: Cardiology

## 2023-07-25 NOTE — Telephone Encounter (Signed)
Pt c/o medication issue:  1. Name of Medication: spironolactone (ALDACTONE) 25 MG tablet   2. How are you currently taking this medication (dosage and times per day)? As prescribed   3. Are you having a reaction (difficulty breathing--STAT)? No   4. What is your medication issue? Patient is out of medication but pharmacy advises he is not due for a refill until 11/11. Please advise.

## 2023-07-25 NOTE — Telephone Encounter (Signed)
Called and spoke to patient who stated he is having trouble getting his Aldactone 20 refilled. [facility] to see [facility] After [facility] [address] they did report he filled with them 9/3 for 90 day supply. Advised patient and he will look to see if he has the prescription at home. He will call back if he can't find medication.

## 2023-07-27 DIAGNOSIS — Z6824 Body mass index (BMI) 24.0-24.9, adult: Secondary | ICD-10-CM | POA: Diagnosis not present

## 2023-07-27 DIAGNOSIS — I2510 Atherosclerotic heart disease of native coronary artery without angina pectoris: Secondary | ICD-10-CM | POA: Diagnosis not present

## 2023-07-27 DIAGNOSIS — I10 Essential (primary) hypertension: Secondary | ICD-10-CM | POA: Diagnosis not present

## 2023-07-27 DIAGNOSIS — I509 Heart failure, unspecified: Secondary | ICD-10-CM | POA: Diagnosis not present

## 2023-07-27 DIAGNOSIS — Z23 Encounter for immunization: Secondary | ICD-10-CM | POA: Diagnosis not present

## 2023-08-15 DIAGNOSIS — Z8546 Personal history of malignant neoplasm of prostate: Secondary | ICD-10-CM | POA: Diagnosis not present

## 2023-08-22 DIAGNOSIS — N5201 Erectile dysfunction due to arterial insufficiency: Secondary | ICD-10-CM | POA: Diagnosis not present

## 2023-08-22 DIAGNOSIS — Z8546 Personal history of malignant neoplasm of prostate: Secondary | ICD-10-CM | POA: Diagnosis not present

## 2023-08-22 DIAGNOSIS — N401 Enlarged prostate with lower urinary tract symptoms: Secondary | ICD-10-CM | POA: Diagnosis not present

## 2023-08-22 DIAGNOSIS — R350 Frequency of micturition: Secondary | ICD-10-CM | POA: Diagnosis not present

## 2023-08-27 DIAGNOSIS — I509 Heart failure, unspecified: Secondary | ICD-10-CM | POA: Diagnosis not present

## 2023-08-27 DIAGNOSIS — I110 Hypertensive heart disease with heart failure: Secondary | ICD-10-CM | POA: Diagnosis not present

## 2023-08-27 DIAGNOSIS — I10 Essential (primary) hypertension: Secondary | ICD-10-CM | POA: Diagnosis not present

## 2023-10-01 ENCOUNTER — Emergency Department (HOSPITAL_COMMUNITY)
Admission: EM | Admit: 2023-10-01 | Discharge: 2023-10-01 | Disposition: A | Discharge status: Home / Self Care | Payer: PPO | Attending: Emergency Medicine | Admitting: Emergency Medicine

## 2023-10-01 ENCOUNTER — Emergency Department (HOSPITAL_COMMUNITY): Payer: PPO

## 2023-10-01 ENCOUNTER — Encounter (HOSPITAL_COMMUNITY): Payer: Self-pay | Admitting: *Deleted

## 2023-10-01 ENCOUNTER — Other Ambulatory Visit: Payer: Self-pay

## 2023-10-01 DIAGNOSIS — I2510 Atherosclerotic heart disease of native coronary artery without angina pectoris: Secondary | ICD-10-CM | POA: Insufficient documentation

## 2023-10-01 DIAGNOSIS — Z8546 Personal history of malignant neoplasm of prostate: Secondary | ICD-10-CM | POA: Insufficient documentation

## 2023-10-01 DIAGNOSIS — Z7982 Long term (current) use of aspirin: Secondary | ICD-10-CM | POA: Insufficient documentation

## 2023-10-01 DIAGNOSIS — J189 Pneumonia, unspecified organism: Secondary | ICD-10-CM | POA: Diagnosis not present

## 2023-10-01 DIAGNOSIS — R918 Other nonspecific abnormal finding of lung field: Secondary | ICD-10-CM | POA: Diagnosis not present

## 2023-10-01 DIAGNOSIS — I509 Heart failure, unspecified: Secondary | ICD-10-CM | POA: Insufficient documentation

## 2023-10-01 DIAGNOSIS — R0602 Shortness of breath: Secondary | ICD-10-CM | POA: Diagnosis not present

## 2023-10-01 DIAGNOSIS — R079 Chest pain, unspecified: Secondary | ICD-10-CM | POA: Diagnosis not present

## 2023-10-01 DIAGNOSIS — I110 Hypertensive heart disease with heart failure: Secondary | ICD-10-CM | POA: Insufficient documentation

## 2023-10-01 DIAGNOSIS — Z79899 Other long term (current) drug therapy: Secondary | ICD-10-CM | POA: Insufficient documentation

## 2023-10-01 DIAGNOSIS — Z20822 Contact with and (suspected) exposure to covid-19: Secondary | ICD-10-CM | POA: Diagnosis not present

## 2023-10-01 DIAGNOSIS — R42 Dizziness and giddiness: Secondary | ICD-10-CM | POA: Diagnosis not present

## 2023-10-01 LAB — CBC WITH DIFFERENTIAL/PLATELET
Abs Immature Granulocytes: 0.01 K/uL (ref 0.00–0.07)
Basophils Absolute: 0 K/uL (ref 0.0–0.1)
Basophils Relative: 1 %
Eosinophils Absolute: 0.2 K/uL (ref 0.0–0.5)
Eosinophils Relative: 3 %
HCT: 41.5 % (ref 39.0–52.0)
Hemoglobin: 14.1 g/dL (ref 13.0–17.0)
Immature Granulocytes: 0 %
Lymphocytes Relative: 33 %
Lymphs Abs: 1.6 K/uL (ref 0.7–4.0)
MCH: 32.6 pg (ref 26.0–34.0)
MCHC: 34 g/dL (ref 30.0–36.0)
MCV: 95.8 fL (ref 80.0–100.0)
Monocytes Absolute: 0.4 K/uL (ref 0.1–1.0)
Monocytes Relative: 8 %
Neutro Abs: 2.6 K/uL (ref 1.7–7.7)
Neutrophils Relative %: 55 %
Platelets: 173 K/uL (ref 150–400)
RBC: 4.33 MIL/uL (ref 4.22–5.81)
RDW: 12.2 % (ref 11.5–15.5)
WBC: 4.8 K/uL (ref 4.0–10.5)
nRBC: 0 % (ref 0.0–0.2)

## 2023-10-01 LAB — COMPREHENSIVE METABOLIC PANEL WITH GFR
ALT: 32 U/L (ref 0–44)
AST: 22 U/L (ref 15–41)
Albumin: 3.7 g/dL (ref 3.5–5.0)
Alkaline Phosphatase: 73 U/L (ref 38–126)
Anion gap: 6 (ref 5–15)
BUN: 16 mg/dL (ref 8–23)
CO2: 27 mmol/L (ref 22–32)
Calcium: 9.1 mg/dL (ref 8.9–10.3)
Chloride: 104 mmol/L (ref 98–111)
Creatinine, Ser: 0.76 mg/dL (ref 0.61–1.24)
GFR, Estimated: >60 mL/min
Glucose, Bld: 117 mg/dL — ABNORMAL HIGH (ref 70–99)
Potassium: 4 mmol/L (ref 3.5–5.1)
Sodium: 137 mmol/L (ref 135–145)
Total Bilirubin: 0.8 mg/dL (ref 0.0–1.2)
Total Protein: 6.4 g/dL — ABNORMAL LOW (ref 6.5–8.1)

## 2023-10-01 LAB — RESP PANEL BY RT-PCR (RSV, FLU A&B, COVID)  RVPGX2
Influenza A by PCR: NEGATIVE
Influenza B by PCR: NEGATIVE
Resp Syncytial Virus by PCR: NEGATIVE
SARS Coronavirus 2 by RT PCR: NEGATIVE

## 2023-10-01 LAB — BRAIN NATRIURETIC PEPTIDE: B Natriuretic Peptide: 231 pg/mL — ABNORMAL HIGH (ref 0.0–100.0)

## 2023-10-01 MED ORDER — AZITHROMYCIN 250 MG PO TABS: 250.0000 mg | ORAL_TABLET | Freq: Every day | ORAL | 0 refills | Status: AC

## 2023-10-01 MED ORDER — AMOXICILLIN-POT CLAVULANATE 875-125 MG PO TABS
1.0000 | ORAL_TABLET | Freq: Once | ORAL | Status: AC
  Administered 2023-10-01: 1 via ORAL
  Filled 2023-10-01: qty 1

## 2023-10-01 MED ORDER — AMOXICILLIN-POT CLAVULANATE 875-125 MG PO TABS: 1.0000 | ORAL_TABLET | Freq: Two times a day (BID) | ORAL | 0 refills | Status: AC

## 2023-10-01 NOTE — ED Triage Notes (Signed)
 Pt with dizziness and SOB starting today, pt went to be seen at [facility] in [place] but sent here. Denies CP.  Emesis x 2 yesterday but denies today.

## 2023-10-01 NOTE — ED Provider Notes (Signed)
 [place] EMERGENCY DEPARTMENT AT [facility] Provider Note  CSN: [id number] Arrival date & time: 10/01/23 1847  Chief Complaint(s) Shortness of Breath  HPI Tyler Chavez is a 73 y.o. male history of coronary artery disease, CHF, presenting to the emergency department with shortness of breath.  Patient reports chills, mild lightheadedness, cough for the past couple of days.  He reports that he has been coughing up some phlegm.  No abdominal pain, back pain, chest pain.  No fevers or chills.  No sore throat.  He reports he has had a little bit of congestion recently.  No leg swelling.  Reports he is compliant with all of his medications including his Entresto .   Past Medical History Past Medical History:  Diagnosis Date   Coronary artery disease    a. 09/2016: Anterior STEMI with 100% Ost LAD stenosis, 95% Post Atrio stenosis, 90% Prox RCA stenosis, and 75% Ost to Prox Cx stenosis. DES to LAD. Staged DES to Post Atrio and DES to RCA performed later in admission.   ED (erectile dysfunction)    Frequency of urination    GERD (gastroesophageal reflux disease)    Hyperlipidemia    Hypertension    Ischemic cardiomyopathy    a. 09/2016: echo showing EF of 30-35% with Grade 2 DD   Myocardial infarction [facility])    Prostate cancer [facility]) urologist- dr wrenn/  oncologist-  dr patrcia   Stage T1c, Gleason 3+3, PSA 8.2, vol 49.9grams   Rash    lower right leg   Patient Active Problem List   Diagnosis Date Noted   Ischemic cardiomyopathy 07/21/2020   Coronary artery disease involving native coronary artery of native heart with angina pectoris (HCC) 12/24/2018   Essential hypertension 12/24/2018   Hyperlipidemia 12/24/2018   ST elevation myocardial infarction (STEMI) of anterior wall, subsequent episode of care (HCC) 10/21/2016   Status post coronary artery stent placement    Acute ST elevation myocardial infarction (STEMI) involving left anterior descending (LAD) coronary artery (HCC)  10/07/2016   STEMI involving left anterior descending coronary artery (HCC)    Malignant neoplasm of prostate (HCC) 09/24/2015   Home Medication(s) Prior to Admission medications   Medication Sig Start Date End Date Taking? Authorizing Provider  amoxicillin -clavulanate (AUGMENTIN ) 875-125 MG tablet Take 1 tablet by mouth every 12 (twelve) hours. 10/01/23  Yes Francesca Elsie CROME, MD  azithromycin  (ZITHROMAX ) 250 MG tablet Take 1 tablet (250 mg total) by mouth daily. Take first 2 tablets together, then 1 every day until finished. 10/01/23  Yes Francesca Elsie CROME, MD  aspirin  EC 81 MG tablet Take 81 mg by mouth daily.    [provider]  atorvastatin  (LIPITOR ) 80 MG tablet TAKE 1 TABLET BY MOUTH EVERY DAY 11/08/22   Lavona Agent, MD  benzonatate  (TESSALON ) 100 MG capsule Take 1 capsule (100 mg total) by mouth every 8 (eight) hours. Patient not taking: Reported on 04/27/2023 02/23/23   Geroldine Berg, MD  calcium  carbonate (TUMS - DOSED IN MG ELEMENTAL CALCIUM ) 500 MG chewable tablet Chew 1 tablet by mouth as needed for indigestion or heartburn.    [provider]  carvedilol  (COREG ) 25 MG tablet TAKE 1 TABLET BY MOUTH TWICE A DAY 05/24/23   Lavona Agent, MD  ENTRESTO  97-103 MG TAKE 1 TABLET BY MOUTH TWICE A DAY 06/20/23   Lavona Agent, MD  furosemide  (LASIX ) 20 MG tablet Take 1 tablet (20 mg total) by mouth daily. Patient taking differently: Take 20 mg by mouth daily as  needed. 01/10/20   Lavona Agent, MD  Multiple Vitamin (MULTIVITAMIN) tablet Take 1 tablet by mouth daily.    [provider]  nitroGLYCERIN  (NITROSTAT ) 0.4 MG SL tablet Place 1 tablet (0.4 mg total) under the tongue every 5 (five) minutes as needed for chest pain. 07/23/20   Lavona Agent, MD  ondansetron  (ZOFRAN -ODT) 4 MG disintegrating tablet Take 1 tablet (4 mg total) by mouth every 8 (eight) hours as needed for nausea or vomiting. Patient not taking: Reported on 04/27/2023 09/27/22   Bari Charmaine FALCON, MD  PROAIR  HFA 108 [phone] Base) MCG/ACT inhaler Inhale 2 puffs into the lungs as needed. 10/04/18   [provider]  spironolactone  (ALDACTONE ) 25 MG tablet Take 1 tablet (25 mg total) by mouth daily. 10/06/22   Lavona Agent, MD  tamsulosin  (FLOMAX ) 0.4 MG CAPS capsule Take 1 capsule (0.4 mg total) by mouth daily. 02/19/16   Watt Rush, MD                                                                                                                                    Past Surgical History Past Surgical History:  Procedure Laterality Date   CARDIAC CATHETERIZATION N/A 10/07/2016   Procedure: Left Heart Cath and Coronary Angiography;  Surgeon: Debby DELENA Sor, MD;  Location: [facility] INVASIVE CV LAB;  Service: Cardiovascular;  Laterality: N/A;   CARDIAC CATHETERIZATION N/A 10/07/2016   Procedure: Coronary Stent Intervention;  Surgeon: Debby DELENA Sor, MD;  Location: [facility] INVASIVE CV LAB;  Service: Cardiovascular;  Laterality: N/A;   CARDIAC CATHETERIZATION N/A 10/11/2016   Procedure: Coronary Stent Intervention;  Surgeon: Peter M Jordan, MD;  Location: [facility] INVASIVE CV LAB;  Service: Cardiovascular;  Laterality: N/A;   CYSTOSCOPY  02/19/2016   Procedure: CYSTOSCOPY;  Surgeon: Rush Watt, MD;  Location: [facility];  Service: Urology;;  no seeds visualized in bladder   NO PAST SURGERIES     RADIOACTIVE SEED IMPLANT N/A 02/19/2016   Procedure: RADIOACTIVE SEED IMPLANT/BRACHYTHERAPY IMPLANT;  Surgeon: Rush Watt, MD;  Location: [facility] ;  Service: Urology;  Laterality: N/A;  79   seeds implanted   Family History Family History  Problem Relation Age of Onset   Heart attack Mother     Social History Social History   Tobacco Use   Smoking status: Former    Current packs/day: 0.00    Average packs/day: 1.5 packs/day for 42.0 years (63.0 ttl pk-yrs)    Types: Cigarettes    Start date: 02/12/1972    Quit date: 02/11/2014    Years since quitting: 9.6   Smokeless  tobacco: Never  Vaping Use   Vaping status: Never Used  Substance Use Topics   Alcohol use: Yes    Comment: occasional   Drug use: No   Allergies Patient has no known allergies.  Review of Systems Review of Systems  All other systems reviewed and are negative.  Physical Exam Vital Signs  I have reviewed the triage vital signs BP 114/80 (BP Location: Right Arm)   Pulse 84   Temp 97.9 F (36.6 C) (Oral)   Resp 20   Ht 6' 2 (1.88 m)   Wt 88.5 kg   SpO2 95%   BMI 25.04 kg/m  Physical Exam Vitals and nursing note reviewed.  Constitutional:      General: He is not in acute distress.    Appearance: Normal appearance.  HENT:     Mouth/Throat:     Mouth: Mucous membranes are moist.  Eyes:     Conjunctiva/sclera: Conjunctivae normal.  Cardiovascular:     Rate and Rhythm: Normal rate and regular rhythm.  Pulmonary:     Effort: Pulmonary effort is normal. No respiratory distress.     Breath sounds: Normal breath sounds.  Abdominal:     General: Abdomen is flat.     Palpations: Abdomen is soft.     Tenderness: There is no abdominal tenderness.  Musculoskeletal:     Right lower leg: No edema.     Left lower leg: No edema.  Skin:    General: Skin is warm and dry.     Capillary Refill: Capillary refill takes less than 2 seconds.  Neurological:     Mental Status: He is alert and oriented to person, place, and time. Mental status is at baseline.  Psychiatric:        Mood and Affect: Mood normal.        Behavior: Behavior normal.     ED Results and Treatments Labs (all labs ordered are listed, but only abnormal results are displayed) Labs Reviewed  COMPREHENSIVE METABOLIC PANEL - Abnormal; Notable for the following components:      Result Value   Glucose, Bld 117 (*)    Total Protein 6.4 (*)    All other components within normal limits  RESP PANEL BY RT-PCR (RSV, FLU A&B, COVID)  RVPGX2  CBC WITH DIFFERENTIAL/PLATELET  BRAIN NATRIURETIC PEPTIDE                                                                                                                           Radiology DG Chest Port 1 View Result Date: 10/01/2023 CLINICAL DATA:  Chest pain and dizziness EXAM: PORTABLE CHEST 1 VIEW COMPARISON:  Chest x-ray 02/23/2023 FINDINGS: There some patchy retrocardiac opacities. There is no pleural effusion or pneumothorax. Cardiomediastinal silhouette is within normal limits. No acute fractures are seen. IMPRESSION: Patchy retrocardiac opacities, atelectasis versus pneumonia. Follow-up PA and lateral chest x-ray recommended in 4-6 weeks to confirm resolution. Electronically Signed   By: Greig Pique M.D.   On: 10/01/2023 22:44    Pertinent labs & imaging results that were available during my care of the patient were reviewed by me and considered in my medical decision making (see MDM for details).  Medications Ordered in ED Medications  amoxicillin -clavulanate (AUGMENTIN ) 875-125 MG per tablet 1 tablet (has no  administration in time range)                                                                                                                                     Procedures Procedures  (including critical care time)  Medical Decision Making / ED Course   MDM:  73 year old male presenting to the emergency department with cough.  Patient overall well-appearing, physical exam reassuring, vitals with no hypoxia, no tachycardia, no fever.  Differential include viral infection or pneumonia, chest x-ray does have some retrocardiac opacities, given his reported productive cough, comorbidities we will go ahead and start him on antibiotics for likely community-acquired pneumonia.  His flu/COVID/RSV testing is negative.  Radiology recommends repeat chest x-ray for clearance.  Discussed with the patient who understands this.  His labs are reassuring with no significant leukocytosis.  His EKG is stable and he is not having any chest pain.  He does not appear  volume overloaded and he has no basilar crackles on exam to suggest CHF exacerbation. Will discharge patient to home. All questions answered. Patient comfortable with plan of discharge. Return precautions discussed with patient and specified on the after visit summary.       Additional history obtained: -External records from outside source obtained and reviewed including: Chart review including previous notes, labs, imaging, consultation notes including UC visit today    Lab Tests: -I ordered, reviewed, and interpreted labs.   The pertinent results include:   Labs Reviewed  COMPREHENSIVE METABOLIC PANEL - Abnormal; Notable for the following components:      Result Value   Glucose, Bld 117 (*)    Total Protein 6.4 (*)    All other components within normal limits  RESP PANEL BY RT-PCR (RSV, FLU A&B, COVID)  RVPGX2  CBC WITH DIFFERENTIAL/PLATELET  BRAIN NATRIURETIC PEPTIDE    Notable for no leukocytosis   EKG   EKG Interpretation Date/Time:  Saturday October 01 2023 18:57:54 EST Ventricular Rate:  87 PR Interval:  214 QRS Duration:  100 QT Interval:  382 QTC Calculation: 459 R Axis:   -65  Text Interpretation: Sinus rhythm with 1st degree A-V block Incomplete right bundle branch block Left anterior fascicular block Anteroseptal infarct (cited on or before 23-Feb-2023) Abnormal ECG When compared with ECG of 23-Feb-2023 00:50, No significant change since last tracing Confirmed by Francesca Fallow ([id number]) on 10/01/2023 11:02:04 PM         Imaging Studies ordered: I ordered imaging studies including CXR On my interpretation imaging demonstrates possible pneumonia  I independently visualized and interpreted imaging. I agree with the radiologist interpretation   Medicines ordered and prescription drug management: Meds ordered this encounter  Medications   amoxicillin -clavulanate (AUGMENTIN ) 875-125 MG per tablet 1 tablet   amoxicillin -clavulanate (AUGMENTIN ) 875-125 MG  tablet    Sig: Take 1 tablet by mouth every 12 (twelve) hours.    Dispense:  14 tablet  Refill:  0   azithromycin  (ZITHROMAX ) 250 MG tablet    Sig: Take 1 tablet (250 mg total) by mouth daily. Take first 2 tablets together, then 1 every day until finished.    Dispense:  6 tablet    Refill:  0    -I have reviewed the patients home medicines and have made adjustments as needed   Cardiac Monitoring: The patient was maintained on a cardiac monitor.  I personally viewed and interpreted the cardiac monitored which showed an underlying rhythm of: NSR  Social Determinants of Health:  Diagnosis or treatment significantly limited by social determinants of health: former smoker   Reevaluation: After the interventions noted above, I reevaluated the patient and found that their symptoms have improved  Co morbidities that complicate the patient evaluation  Past Medical History:  Diagnosis Date   Coronary artery disease    a. 09/2016: Anterior STEMI with 100% Ost LAD stenosis, 95% Post Atrio stenosis, 90% Prox RCA stenosis, and 75% Ost to Prox Cx stenosis. DES to LAD. Staged DES to Post Atrio and DES to RCA performed later in admission.   ED (erectile dysfunction)    Frequency of urination    GERD (gastroesophageal reflux disease)    Hyperlipidemia    Hypertension    Ischemic cardiomyopathy    a. 09/2016: echo showing EF of 30-35% with Grade 2 DD   Myocardial infarction [facility])    Prostate cancer [facility]) urologist- dr wrenn/  oncologist-  dr patrcia   Stage T1c, Gleason 3+3, PSA 8.2, vol 49.9grams   Rash    lower right leg      Dispostion: Disposition decision including need for hospitalization was considered, and patient discharged from emergency department.    Final Clinical Impression(s) / ED Diagnoses Final diagnoses:  Community acquired pneumonia, unspecified laterality     This chart was dictated using voice recognition software.  Despite best efforts to proofread,  errors  can occur which can change the documentation meaning.    Francesca Elsie CROME, MD 10/01/23 [phone]

## 2023-10-01 NOTE — ED Notes (Signed)
 Patient verbalizes understanding of discharge instructions. Opportunity for questioning and answers were provided. Armband removed by staff, pt discharged from ED. Ambulated out to lobby, driven home by friend

## 2023-10-01 NOTE — Discharge Instructions (Addendum)
 We evaluated you for your cough.  Your chest x-ray does show a small pneumonia.  We have prescribed you antibiotics for this.  Please take them as prescribed.  Please follow-up closely with your primary doctor.  If you develop any new or worsening symptoms such as shortness of breath, lightheadedness or dizziness, fevers, chest pain, increasing weakness or fatigue, please return to the emergency department for reassessment.  Please note that the radiologist recommends that we repeat your chest x-ray in 4 to 6 weeks.  Please follow-up with your primary care doctor to discuss this.

## 2023-10-06 DIAGNOSIS — I110 Hypertensive heart disease with heart failure: Secondary | ICD-10-CM | POA: Diagnosis not present

## 2023-10-06 DIAGNOSIS — J189 Pneumonia, unspecified organism: Secondary | ICD-10-CM | POA: Diagnosis not present

## 2023-10-06 DIAGNOSIS — I5032 Chronic diastolic (congestive) heart failure: Secondary | ICD-10-CM | POA: Diagnosis not present

## 2023-10-06 DIAGNOSIS — I10 Essential (primary) hypertension: Secondary | ICD-10-CM | POA: Diagnosis not present

## 2023-10-06 DIAGNOSIS — Z6824 Body mass index (BMI) 24.0-24.9, adult: Secondary | ICD-10-CM | POA: Diagnosis not present

## 2023-11-14 ENCOUNTER — Encounter (HOSPITAL_COMMUNITY): Payer: Self-pay | Admitting: Emergency Medicine

## 2023-11-14 ENCOUNTER — Emergency Department (HOSPITAL_COMMUNITY)
Admission: EM | Admit: 2023-11-14 | Discharge: 2023-11-15 | Disposition: A | Discharge status: Home / Self Care | Payer: PPO | Attending: Student | Admitting: Student

## 2023-11-14 ENCOUNTER — Other Ambulatory Visit: Payer: Self-pay | Admitting: Cardiology

## 2023-11-14 ENCOUNTER — Other Ambulatory Visit: Payer: Self-pay

## 2023-11-14 DIAGNOSIS — Z79899 Other long term (current) drug therapy: Secondary | ICD-10-CM | POA: Diagnosis not present

## 2023-11-14 DIAGNOSIS — Z8546 Personal history of malignant neoplasm of prostate: Secondary | ICD-10-CM | POA: Diagnosis not present

## 2023-11-14 DIAGNOSIS — I25119 Atherosclerotic heart disease of native coronary artery with unspecified angina pectoris: Secondary | ICD-10-CM | POA: Insufficient documentation

## 2023-11-14 DIAGNOSIS — I499 Cardiac arrhythmia, unspecified: Secondary | ICD-10-CM | POA: Diagnosis not present

## 2023-11-14 DIAGNOSIS — Z7982 Long term (current) use of aspirin: Secondary | ICD-10-CM | POA: Diagnosis not present

## 2023-11-14 DIAGNOSIS — I10 Essential (primary) hypertension: Secondary | ICD-10-CM | POA: Diagnosis not present

## 2023-11-14 DIAGNOSIS — Z87891 Personal history of nicotine dependence: Secondary | ICD-10-CM | POA: Diagnosis not present

## 2023-11-14 DIAGNOSIS — Z955 Presence of coronary angioplasty implant and graft: Secondary | ICD-10-CM | POA: Insufficient documentation

## 2023-11-14 DIAGNOSIS — R42 Dizziness and giddiness: Secondary | ICD-10-CM | POA: Insufficient documentation

## 2023-11-14 DIAGNOSIS — R002 Palpitations: Secondary | ICD-10-CM | POA: Insufficient documentation

## 2023-11-14 LAB — BASIC METABOLIC PANEL
Anion gap: 10 (ref 5–15)
BUN: 17 mg/dL (ref 8–23)
CO2: 28 mmol/L (ref 22–32)
Calcium: 9.6 mg/dL (ref 8.9–10.3)
Chloride: 101 mmol/L (ref 98–111)
Creatinine, Ser: 0.79 mg/dL (ref 0.61–1.24)
GFR, Estimated: >60 mL/min (ref >60)
Glucose, Bld: 96 mg/dL (ref 70–99)
Potassium: 4.4 mmol/L (ref 3.5–5.1)
Sodium: 139 mmol/L (ref 135–145)

## 2023-11-14 LAB — CBC
HCT: 41.6 % (ref 39.0–52.0)
Hemoglobin: 13.8 g/dL (ref 13.0–17.0)
MCH: 32.7 pg (ref 26.0–34.0)
MCHC: 33.2 g/dL (ref 30.0–36.0)
MCV: 98.6 fL (ref 80.0–100.0)
Platelets: 195 10*3/uL (ref 150–400)
RBC: 4.22 MIL/uL (ref 4.22–5.81)
RDW: 12.9 % (ref 11.5–15.5)
WBC: 5.9 10*3/uL (ref 4.0–10.5)
nRBC: 0 % (ref 0.0–0.2)

## 2023-11-14 LAB — TROPONIN I (HIGH SENSITIVITY): Troponin I (High Sensitivity): 4 ng/L (ref <18)

## 2023-11-14 NOTE — ED Triage Notes (Addendum)
Pt arrive POV to ED c/o being told by UC that his heart is skipping beats. Pt being seen at [facility] for dizziness and was advised by them to come to ED for evaluation. Pt states he began to have dizziness this afternoon at about 4pm.

## 2023-11-15 LAB — TROPONIN I (HIGH SENSITIVITY): Troponin I (High Sensitivity): 4 ng/L (ref <18)

## 2023-11-15 NOTE — ED Provider Notes (Signed)
[place] EMERGENCY DEPARTMENT AT [facility] Provider Note  CSN: [id number] Arrival date & time: 11/14/23 1906  Chief Complaint(s) Palpitations  HPI Tyler Chavez is a 73 y.o. male with PMH CAD status post STEMI with DES placement, GERD, ischemic cardiomyopathy with EF 35 to 40% on last echo in 2023 who presents emergency department as a transfer from urgent care due to concern for irregular heartbeat.  Patient states that he went to urgent care for feeling "swimmy headed".  Urgent care providers reportedly found the patient to be in an irregular rhythm and transferred him to the emergency department.  Unfortunately, patient arrives with no EKG for review and I cannot find this EKG on Care Everywhere.  The symptoms have completely resolved upon transfer to the ER.  Patient believes that he likely felt dizzy because he has not been eating well.  Denies any chest pain, shortness of breath, nausea, vomiting, diaphoresis or any other systemic complaints.   Past Medical History Past Medical History:  Diagnosis Date   Coronary artery disease    a. 09/2016: Anterior STEMI with 100% Ost LAD stenosis, 95% Post Atrio stenosis, 90% Prox RCA stenosis, and 75% Ost to Prox Cx stenosis. DES to LAD. Staged DES to Post Atrio and DES to RCA performed later in admission.   ED (erectile dysfunction)    Frequency of urination    GERD (gastroesophageal reflux disease)    Hyperlipidemia    Hypertension    Ischemic cardiomyopathy    a. 09/2016: echo showing EF of 30-35% with Grade 2 DD   Myocardial infarction [facility])    Prostate cancer [facility]) urologist- dr wrenn/  oncologist-  dr Kathrynn Running   Stage T1c, Gleason 3+3, PSA 8.2, vol 49.9grams   Rash    lower right leg   Patient Active Problem List   Diagnosis Date Noted   Ischemic cardiomyopathy 07/21/2020   Coronary artery disease involving native coronary artery of native heart with angina pectoris (HCC) 12/24/2018   Essential hypertension 12/24/2018    Hyperlipidemia 12/24/2018   ST elevation myocardial infarction (STEMI) of anterior wall, subsequent episode of care (HCC) 10/21/2016   Status post coronary artery stent placement    Acute ST elevation myocardial infarction (STEMI) involving left anterior descending (LAD) coronary artery (HCC) 10/07/2016   STEMI involving left anterior descending coronary artery (HCC)    Malignant neoplasm of prostate (HCC) 09/24/2015   Home Medication(s) Prior to Admission medications   Medication Sig Start Date End Date Taking? Authorizing Provider  amoxicillin-clavulanate (AUGMENTIN) 875-125 MG tablet Take 1 tablet by mouth every 12 (twelve) hours. 10/01/23   Lonell Grandchild, MD  aspirin EC 81 MG tablet Take 81 mg by mouth daily.    [provider]  atorvastatin (LIPITOR) 80 MG tablet TAKE 1 TABLET BY MOUTH EVERY DAY 11/08/22   Rollene Rotunda, MD  azithromycin (ZITHROMAX) 250 MG tablet Take 1 tablet (250 mg total) by mouth daily. Take first 2 tablets together, then 1 every day until finished. 10/01/23   Lonell Grandchild, MD  benzonatate (TESSALON) 100 MG capsule Take 1 capsule (100 mg total) by mouth every 8 (eight) hours. Patient not taking: Reported on 04/27/2023 02/23/23   Geoffery Lyons, MD  calcium carbonate (TUMS - DOSED IN MG ELEMENTAL CALCIUM) 500 MG chewable tablet Chew 1 tablet by mouth as needed for indigestion or heartburn.    [provider]  carvedilol (COREG) 25 MG tablet TAKE 1 TABLET BY MOUTH TWICE A DAY 05/24/23   Hochrein,  Fayrene Fearing, MD  ENTRESTO 97-103 MG TAKE 1 TABLET BY MOUTH TWICE A DAY 06/20/23   Rollene Rotunda, MD  furosemide (LASIX) 20 MG tablet Take 1 tablet (20 mg total) by mouth daily. Patient taking differently: Take 20 mg by mouth daily as needed. 01/10/20   Rollene Rotunda, MD  Multiple Vitamin (MULTIVITAMIN) tablet Take 1 tablet by mouth daily.    [provider]  nitroGLYCERIN (NITROSTAT) 0.4 MG SL tablet Place 1 tablet (0.4 mg total) under the tongue  every 5 (five) minutes as needed for chest pain. 07/23/20   Rollene Rotunda, MD  ondansetron (ZOFRAN-ODT) 4 MG disintegrating tablet Take 1 tablet (4 mg total) by mouth every 8 (eight) hours as needed for nausea or vomiting. Patient not taking: Reported on 04/27/2023 09/27/22   Shon Baton, MD  PROAIR HFA 108 [phone] Base) MCG/ACT inhaler Inhale 2 puffs into the lungs as needed. 10/04/18   [provider]  spironolactone (ALDACTONE) 25 MG tablet Take 1 tablet (25 mg total) by mouth daily. 10/06/22   Rollene Rotunda, MD  tamsulosin (FLOMAX) 0.4 MG CAPS capsule Take 1 capsule (0.4 mg total) by mouth daily. 02/19/16   Bjorn Pippin, MD                                                                                                                                    Past Surgical History Past Surgical History:  Procedure Laterality Date   CARDIAC CATHETERIZATION N/A 10/07/2016   Procedure: Left Heart Cath and Coronary Angiography;  Surgeon: Lennette Bihari, MD;  Location: [facility] INVASIVE CV LAB;  Service: Cardiovascular;  Laterality: N/A;   CARDIAC CATHETERIZATION N/A 10/07/2016   Procedure: Coronary Stent Intervention;  Surgeon: Lennette Bihari, MD;  Location: [facility] INVASIVE CV LAB;  Service: Cardiovascular;  Laterality: N/A;   CARDIAC CATHETERIZATION N/A 10/11/2016   Procedure: Coronary Stent Intervention;  Surgeon: Peter M Swaziland, MD;  Location: [facility] INVASIVE CV LAB;  Service: Cardiovascular;  Laterality: N/A;   CYSTOSCOPY  02/19/2016   Procedure: CYSTOSCOPY;  Surgeon: Bjorn Pippin, MD;  Location: [facility];  Service: Urology;;  no seeds visualized in bladder   NO PAST SURGERIES     RADIOACTIVE SEED IMPLANT N/A 02/19/2016   Procedure: RADIOACTIVE SEED IMPLANT/BRACHYTHERAPY IMPLANT;  Surgeon: Bjorn Pippin, MD;  Location: [facility];  Service: Urology;  Laterality: N/A;  79   seeds implanted   Family History Family History  Problem Relation Age of Onset   Heart attack Mother      Social History Social History   Tobacco Use   Smoking status: Former    Current packs/day: 0.00    Average packs/day: 1.5 packs/day for 42.0 years (63.0 ttl pk-yrs)    Types: Cigarettes    Start date: 02/12/1972    Quit date: 02/11/2014    Years since quitting: 9.7   Smokeless tobacco: Never  Vaping Use   Vaping  status: Never Used  Substance Use Topics   Alcohol use: Yes    Comment: occasional   Drug use: No   Allergies Patient has no known allergies.  Review of Systems Review of Systems  Cardiovascular:  Positive for palpitations.  Neurological:  Positive for dizziness.    Physical Exam Vital Signs  I have reviewed the triage vital signs BP (!) 165/92   Pulse 69   Temp 97.7 F (36.5 C) (Oral)   Resp 20   Ht 6\' 2"  (1.88 m)   Wt 88.5 kg   SpO2 96%   BMI 25.04 kg/m   Physical Exam Constitutional:      General: He is not in acute distress.    Appearance: Normal appearance.  HENT:     Head: Normocephalic and atraumatic.     Nose: No congestion or rhinorrhea.  Eyes:     General:        Right eye: No discharge.        Left eye: No discharge.     Extraocular Movements: Extraocular movements intact.     Pupils: Pupils are equal, round, and reactive to light.  Cardiovascular:     Rate and Rhythm: Normal rate and regular rhythm.     Heart sounds: No murmur heard. Pulmonary:     Effort: No respiratory distress.     Breath sounds: No wheezing or rales.  Abdominal:     General: There is no distension.     Tenderness: There is no abdominal tenderness.  Musculoskeletal:        General: Normal range of motion.     Cervical back: Normal range of motion.  Skin:    General: Skin is warm and dry.  Neurological:     General: No focal deficit present.     Mental Status: He is alert.     ED Results and Treatments Labs (all labs ordered are listed, but only abnormal results are displayed) Labs Reviewed  BASIC METABOLIC PANEL  CBC  URINALYSIS, ROUTINE W  REFLEX MICROSCOPIC  TROPONIN I (HIGH SENSITIVITY)  TROPONIN I (HIGH SENSITIVITY)                                                                                                                          Radiology No results found.  Pertinent labs & imaging results that were available during my care of the patient were reviewed by me and considered in my medical decision making (see MDM for details).  Medications Ordered in ED Medications - No data to display  Procedures Procedures  (including critical care time)  Medical Decision Making / ED Course   This patient presents to the ED for concern of palpitations, this involves an extensive number of treatment options, and is a complaint that carries with it a high risk of complications and morbidity.  The differential diagnosis includes electrolyte abnormality, A-fib, a flutter, PVCs, dysrhythmia, dehydration  MDM: Patient seen emergency room for evaluation of dizziness and urgent care concern for irregular heartbeat.  Physical exam is unremarkable with a normal cardiopulmonary exam.  Laboratory evaluation is unremarkable including negative high-sensitivity troponin.  ECG here in the emergency department shows normal sinus rhythm with no evidence of atrial fibrillation or a flutter.  Patient with intermittent PVCs on cardiac monitoring but again the rhythm remained sinus.  Patient is asymptomatic here in the emergency department and currently does not meet inpatient criteria for admission.  He was discharged with outpatient follow-up.   Additional history obtained:  -External records from outside source obtained and reviewed including: Chart review including previous notes, labs, imaging, consultation notes   Lab Tests: -I ordered, reviewed, and interpreted labs.   The pertinent results include:   Labs  Reviewed  BASIC METABOLIC PANEL  CBC  URINALYSIS, ROUTINE W REFLEX MICROSCOPIC  TROPONIN I (HIGH SENSITIVITY)  TROPONIN I (HIGH SENSITIVITY)      EKG     Medicines ordered and prescription drug management: No orders of the defined types were placed in this encounter.   -I have reviewed the patients home medicines and have made adjustments as needed  Critical interventions none    Cardiac Monitoring: The patient was maintained on a cardiac monitor.  I personally viewed and interpreted the cardiac monitored which showed an underlying rhythm of: NSR  Social Determinants of Health:  Factors impacting patients care include: none   Reevaluation: After the interventions noted above, I reevaluated the patient and found that they have :stayed the same  Co morbidities that complicate the patient evaluation  Past Medical History:  Diagnosis Date   Coronary artery disease    a. 09/2016: Anterior STEMI with 100% Ost LAD stenosis, 95% Post Atrio stenosis, 90% Prox RCA stenosis, and 75% Ost to Prox Cx stenosis. DES to LAD. Staged DES to Post Atrio and DES to RCA performed later in admission.   ED (erectile dysfunction)    Frequency of urination    GERD (gastroesophageal reflux disease)    Hyperlipidemia    Hypertension    Ischemic cardiomyopathy    a. 09/2016: echo showing EF of 30-35% with Grade 2 DD   Myocardial infarction [facility])    Prostate cancer [facility]) urologist- dr wrenn/  oncologist-  dr Kathrynn Running   Stage T1c, Gleason 3+3, PSA 8.2, vol 49.9grams   Rash    lower right leg      Dispostion: I considered admission for this patient, but at this time he does not meet inpatient criteria for admission and will be discharged with outpatient follow-up     Final Clinical Impression(s) / ED Diagnoses Final diagnoses:  Dizziness  Palpitations     @PCDICTATION @    Glendora Score, MD 11/15/23 [phone]

## 2023-11-17 ENCOUNTER — Telehealth: Payer: Self-pay | Admitting: Pharmacy Technician

## 2023-11-17 ENCOUNTER — Other Ambulatory Visit (HOSPITAL_COMMUNITY): Payer: Self-pay

## 2023-11-17 ENCOUNTER — Telehealth: Payer: Self-pay | Admitting: Cardiology

## 2023-11-17 MED ORDER — SPIRONOLACTONE 25 MG PO TABS: 25.0000 mg | ORAL_TABLET | Freq: Every day | ORAL | 1 refills | Status: DC

## 2023-11-17 NOTE — Telephone Encounter (Signed)
Pharmacy Patient Advocate Encounter   Received notification from Pt Calls Messages that prior authorization for Spironolactone is required/requested.   Insurance verification completed.   The patient is insured through [facility] ADVANTAGE/RX ADVANCE .   Per test claim: The current 11/17/23 day co-pay is, $0.00- one month .  No PA needed at this time. This test claim was processed through [facility]- copay amounts may vary at other pharmacies due to pharmacy/plan contracts, or as the patient moves through the different stages of their insurance plan.

## 2023-11-17 NOTE — Telephone Encounter (Signed)
Patient identification verified by 2 forms. Marilynn Rail, RN    Called and spoke to patient  Informed patient:   -refill needed for spironolactone note PA   -need to schedule annual appointment  Patient aware:   -Rx sent to preferred pharmacy   -OV scheduled for 6/18 at 1:20pm  Patient has no further questions at this time

## 2023-11-17 NOTE — Telephone Encounter (Signed)
Pt c/o medication issue:  1. Name of Medication: spironolactone (ALDACTONE) 25 MG tablet   2. How are you currently taking this medication (dosage and times per day)?    3. Are you having a reaction (difficulty breathing--STAT)? no  4. What is your medication issue? Patient states that he needs prior auth for this medication. Please advise

## 2023-11-21 DIAGNOSIS — I10 Essential (primary) hypertension: Secondary | ICD-10-CM | POA: Diagnosis not present

## 2023-11-21 DIAGNOSIS — E782 Mixed hyperlipidemia: Secondary | ICD-10-CM | POA: Diagnosis not present

## 2023-11-21 DIAGNOSIS — I5032 Chronic diastolic (congestive) heart failure: Secondary | ICD-10-CM | POA: Diagnosis not present

## 2023-11-21 DIAGNOSIS — Z6824 Body mass index (BMI) 24.0-24.9, adult: Secondary | ICD-10-CM | POA: Diagnosis not present

## 2024-03-12 DIAGNOSIS — I5022 Chronic systolic (congestive) heart failure: Secondary | ICD-10-CM | POA: Insufficient documentation

## 2024-03-12 NOTE — Progress Notes (Unsigned)
  Cardiology Office Note:   Date:  03/14/2024  ID:  Tyler Chavez, DOB [birth year], MRN [id number] PCP: Kathyleen Parkins, MD  [facility] Providers Cardiologist:  Eilleen Grates, MD {  History of Present Illness:   Tyler Chavez is a 73 y.o. male  for follow up of CAD with anterior STEMI found at outside hospital on 10/06/2016.  He was urgently transferred to [facility] for emergent cardiac catheterization which revealed total occlusion of the LAD at the ostium, 75% proximal circumflex stenosis, 95% proximal RCA stenosis, and 95% PLA stenosis.  He underwent PCI to his LAD and DES insertion.  The procedure was complicated by hypotension and cardiogenic shock requiring dopamine  and fluid resuscitation.  7 days later he underwent staged PCI to his PLA and proximal RCA.  EF improved to 35%. He was initially fitted with a LifeVest..  He had an echocardiogram most recently in September 2023 and his EF was 35 to 40%.  Since I last saw him he has done well.  He stays very active.  He was having some dizziness in February but it turns out that this was probably just sinus.  He went to the emergency room and I reviewed these records.  There was nothing cardiac going on.  He is not describing any orthostatic symptoms.  He is not having any palpitations, presyncope or syncope.  He denies any chest pressure, neck or arm discomfort.  He has class I symptoms with no new shortness of breath, PND or orthopnea.  He has had no weight gain or edema.  ROS: As stated in the HPI and negative for all other systems.  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  11/14/2023    Risk Assessment/Calculations:          Physical Exam:   VS:  BP 134/88   Pulse 64   Ht 6' 2 (1.88 m)   Wt 199 lb (90.3 kg)   BMI 25.55 kg/m    Wt Readings from Last 3 Encounters:  03/14/24 199 lb (90.3 kg)  11/14/23 195 lb (88.5 kg)  10/01/23 195 lb (88.5 kg)     GEN:  Well nourished, well developed in no acute distress NECK: No JVD; No carotid bruits CARDIAC: RRR, no murmurs, rubs, gallops RESPIRATORY:  Clear to auscultation without rales, wheezing or rhonchi  ABDOMEN: Soft, non-tender, non-distended EXTREMITIES:  No edema; No deformity   ASSESSMENT AND PLAN:   CAD The patient has no new sypmtoms.  No further cardiovascular testing is indicated.  We will continue with aggressive risk reduction and meds as listed.  Ischemic cardiomyopathy/Chronic systolic heart failure (HCC) He seems to be euvolemic.  I think his blood pressure will tolerate the addition of Jardiance 10 mg daily for optimal medical therapy.  He will otherwise remain on the meds as listed.   Hyperlipidemia LDL was 63.  This was last year and he is overdue for follow-up.  I like to have his LDL be in the 50s.   Essential hypertension This is being treated in the context of managing his heart failure.      Follow up with me in 1 year  Signed, Eilleen Grates, MD

## 2024-03-14 ENCOUNTER — Encounter: Payer: Self-pay | Admitting: Cardiology

## 2024-03-14 ENCOUNTER — Ambulatory Visit: Payer: PPO | Admitting: Cardiology

## 2024-03-14 VITALS — BP 134/88 | HR 64 | Ht 74.0 in | Wt 199.0 lb

## 2024-03-14 DIAGNOSIS — I2510 Atherosclerotic heart disease of native coronary artery without angina pectoris: Secondary | ICD-10-CM | POA: Diagnosis not present

## 2024-03-14 DIAGNOSIS — E785 Hyperlipidemia, unspecified: Secondary | ICD-10-CM | POA: Diagnosis not present

## 2024-03-14 DIAGNOSIS — I10 Essential (primary) hypertension: Secondary | ICD-10-CM

## 2024-03-14 DIAGNOSIS — I5022 Chronic systolic (congestive) heart failure: Secondary | ICD-10-CM

## 2024-03-14 MED ORDER — EMPAGLIFLOZIN 10 MG PO TABS: 10.0000 mg | ORAL_TABLET | Freq: Every day | ORAL | 11 refills | Status: AC

## 2024-03-14 NOTE — Patient Instructions (Signed)
 Medication Instructions:  Your physician has recommended you make the following change in your medication:   Jardiance 10 mg Daily   *If you need a refill on your cardiac medications before your next appointment, please call your pharmacy*  Lab Work: Your physician recommends that you return for lab work Fasting ( Lipid)   If you have labs (blood work) drawn today and your tests are completely normal, you will receive your results only by: MyChart Message (if you have MyChart) OR A paper copy in the mail If you have any lab test that is abnormal or we need to change your treatment, we will call you to review the results.  Testing/Procedures: NONE   Follow-Up: At [facility], you and your health needs are our priority.  As part of our continuing mission to provide you with exceptional heart care, our providers are all part of one team.  This team includes your primary Cardiologist (physician) and Advanced Practice Providers or APPs (Physician Assistants and Nurse Practitioners) who all work together to provide you with the care you need, when you need it.  Your next appointment:   1 year(s)  Provider:   Eilleen Grates, MD    We recommend signing up for the patient portal called MyChart.  Sign up information is provided on this After Visit Summary.  MyChart is used to connect with patients for Virtual Visits (Telemedicine).  Patients are able to view lab/test results, encounter notes, upcoming appointments, etc.  Non-urgent messages can be sent to your provider as well.   To learn more about what you can do with MyChart, go to [email or web address].   Other Instructions Thank you for choosing [facility]!

## 2024-03-15 DIAGNOSIS — E785 Hyperlipidemia, unspecified: Secondary | ICD-10-CM | POA: Diagnosis not present

## 2024-03-16 ENCOUNTER — Ambulatory Visit: Payer: Self-pay | Admitting: Cardiology

## 2024-03-16 LAB — LIPID PANEL
Chol/HDL Ratio: 2.6 ratio (ref 0.0–5.0)
Cholesterol, Total: 126 mg/dL (ref 100–199)
HDL: 48 mg/dL (ref >39)
LDL Chol Calc (NIH): 65 mg/dL (ref 0–99)
Triglycerides: 59 mg/dL (ref 0–149)
VLDL Cholesterol Cal: 13 mg/dL (ref 5–40)

## 2024-04-08 ENCOUNTER — Encounter (HOSPITAL_COMMUNITY): Payer: Self-pay | Admitting: Emergency Medicine

## 2024-04-08 ENCOUNTER — Emergency Department (HOSPITAL_COMMUNITY)

## 2024-04-08 ENCOUNTER — Emergency Department (HOSPITAL_COMMUNITY): Admission: EM | Admit: 2024-04-08 | Discharge: 2024-04-08 | Disposition: A | Discharge status: Home / Self Care

## 2024-04-08 ENCOUNTER — Other Ambulatory Visit: Payer: Self-pay

## 2024-04-08 DIAGNOSIS — Z7982 Long term (current) use of aspirin: Secondary | ICD-10-CM | POA: Diagnosis not present

## 2024-04-08 DIAGNOSIS — W57XXXA Bitten or stung by nonvenomous insect and other nonvenomous arthropods, initial encounter: Secondary | ICD-10-CM | POA: Diagnosis not present

## 2024-04-08 DIAGNOSIS — S20462A Insect bite (nonvenomous) of left back wall of thorax, initial encounter: Secondary | ICD-10-CM | POA: Diagnosis not present

## 2024-04-08 DIAGNOSIS — R112 Nausea with vomiting, unspecified: Secondary | ICD-10-CM | POA: Diagnosis not present

## 2024-04-08 DIAGNOSIS — R109 Unspecified abdominal pain: Secondary | ICD-10-CM | POA: Diagnosis not present

## 2024-04-08 DIAGNOSIS — N281 Cyst of kidney, acquired: Secondary | ICD-10-CM | POA: Diagnosis not present

## 2024-04-08 DIAGNOSIS — I10 Essential (primary) hypertension: Secondary | ICD-10-CM | POA: Diagnosis not present

## 2024-04-08 DIAGNOSIS — R932 Abnormal findings on diagnostic imaging of liver and biliary tract: Secondary | ICD-10-CM | POA: Diagnosis not present

## 2024-04-08 LAB — URINALYSIS, ROUTINE W REFLEX MICROSCOPIC
Bacteria, UA: NONE SEEN
Bilirubin Urine: NEGATIVE
Glucose, UA: >=500 mg/dL — AB
Hgb urine dipstick: NEGATIVE
Ketones, ur: NEGATIVE mg/dL
Leukocytes,Ua: NEGATIVE
Nitrite: NEGATIVE
Protein, ur: NEGATIVE mg/dL
Specific Gravity, Urine: 1.005 (ref 1.005–1.030)
pH: 6 (ref 5.0–8.0)

## 2024-04-08 LAB — COMPREHENSIVE METABOLIC PANEL WITH GFR
ALT: 27 U/L (ref 0–44)
AST: 24 U/L (ref 15–41)
Albumin: 4 g/dL (ref 3.5–5.0)
Alkaline Phosphatase: 57 U/L (ref 38–126)
Anion gap: 8 (ref 5–15)
BUN: 15 mg/dL (ref 8–23)
CO2: 30 mmol/L (ref 22–32)
Calcium: 9.4 mg/dL (ref 8.9–10.3)
Chloride: 100 mmol/L (ref 98–111)
Creatinine, Ser: 0.77 mg/dL (ref 0.61–1.24)
GFR, Estimated: >60 mL/min (ref >60)
Glucose, Bld: 102 mg/dL — ABNORMAL HIGH (ref 70–99)
Potassium: 4.1 mmol/L (ref 3.5–5.1)
Sodium: 138 mmol/L (ref 135–145)
Total Bilirubin: 1.2 mg/dL (ref 0.0–1.2)
Total Protein: 7.2 g/dL (ref 6.5–8.1)

## 2024-04-08 LAB — CBC
HCT: 43.8 % (ref 39.0–52.0)
Hemoglobin: 14.6 g/dL (ref 13.0–17.0)
MCH: 32.4 pg (ref 26.0–34.0)
MCHC: 33.3 g/dL (ref 30.0–36.0)
MCV: 97.3 fL (ref 80.0–100.0)
Platelets: 179 K/uL (ref 150–400)
RBC: 4.5 MIL/uL (ref 4.22–5.81)
RDW: 12.7 % (ref 11.5–15.5)
WBC: 3.8 K/uL — ABNORMAL LOW (ref 4.0–10.5)
nRBC: 0 % (ref 0.0–0.2)

## 2024-04-08 LAB — RESP PANEL BY RT-PCR (RSV, FLU A&B, COVID)  RVPGX2
Influenza A by PCR: NEGATIVE
Influenza B by PCR: NEGATIVE
Resp Syncytial Virus by PCR: NEGATIVE
SARS Coronavirus 2 by RT PCR: NEGATIVE

## 2024-04-08 LAB — LIPASE, BLOOD: Lipase: 30 U/L (ref 11–51)

## 2024-04-08 LAB — TROPONIN I (HIGH SENSITIVITY): Troponin I (High Sensitivity): 6 ng/L (ref <18)

## 2024-04-08 MED ORDER — ONDANSETRON HCL 4 MG/2ML IJ SOLN
4.0000 mg | Freq: Once | INTRAMUSCULAR | Status: DC
Start: 2024-04-08 — End: 2024-04-08

## 2024-04-08 MED ORDER — DOXYCYCLINE HYCLATE 100 MG PO CAPS: 100.0000 mg | ORAL_CAPSULE | Freq: Two times a day (BID) | ORAL | 0 refills | Status: AC

## 2024-04-08 MED ORDER — ONDANSETRON 4 MG PO TBDP: 4.0000 mg | ORAL_TABLET | Freq: Four times a day (QID) | ORAL | 0 refills | Status: AC | PRN

## 2024-04-08 MED ORDER — IOHEXOL 300 MG/ML  SOLN
100.0000 mL | Freq: Once | INTRAMUSCULAR | Status: AC | PRN
  Administered 2024-04-08: 100 mL via INTRAVENOUS

## 2024-04-08 MED ORDER — DOXYCYCLINE HYCLATE 100 MG PO TABS
100.0000 mg | ORAL_TABLET | Freq: Once | ORAL | Status: AC
Start: 2024-04-08 — End: 2024-04-08
  Administered 2024-04-08: 100 mg via ORAL
  Filled 2024-04-08: qty 1

## 2024-04-08 NOTE — Discharge Instructions (Addendum)
 Pleasure taking care of you today.  Your blood work was reassuring.  Because you are vomiting and having some discomfort in your abdomen we did a CT scan that did not show any blockage or cause of your vomiting today.  There were some incidental findings that it is important for you to follow-up with.  First, it looks like they can see prior damage from your heart attack.  We did blood work today to make sure there is no active heart damage and this was normal.  Follow-up with your cardiologist.  You do have hardening of your arteries in your heart.  We are treating you with antibiotics for your tick bite since you have been feeling unwell since it happened.  This medication would treat both Lyme disease and [place] spotted fever.  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  Your PCP should be able to arrange this.

## 2024-04-08 NOTE — ED Triage Notes (Signed)
 Pt via POV c/o n/v since yesterday and a tick bite to left upper flank from several days ago (tick has been removed). Pt estimates 3 episodes of vomiting since onset of symptoms, no sick contacts. No pain, no diarrhea, no fever.

## 2024-04-08 NOTE — ED Provider Notes (Signed)
 [place] [facility] AT [facility] Provider Note   CSN: [id number] Arrival date & time: 04/08/24  1119     Patient presents with: Emesis and Insect Bite   Tyler Chavez is a 73 y.o. male.  The ER complaining of nausea x 3 days.  He states he has vomited several times but during the evenings he feels better and is able to keep down food and liquids.  He denies constipation, diarrhea, hematemesis or medic easier.  He has not had any urinary symptoms.  He denies fever or chills, denies chest pain or shortness of breath. He denies dizziness or syncope.   Emesis      Prior to Admission medications   Medication Sig Start Date End Date Taking? Authorizing Provider  doxycycline  (VIBRAMYCIN ) 100 MG capsule Take 1 capsule (100 mg total) by mouth 2 (two) times daily for 7 days. 04/08/24 04/15/24 Yes Sadiel Mota A, PA-C  ondansetron  (ZOFRAN -ODT) 4 MG disintegrating tablet Take 1 tablet (4 mg total) by mouth every 6 (six) hours as needed for nausea or vomiting. 04/08/24  Yes Suellen, Diangelo Radel A, PA-C  amoxicillin -clavulanate (AUGMENTIN ) 875-125 MG tablet Take 1 tablet by mouth every 12 (twelve) hours. Patient not taking: Reported on 03/14/2024 10/01/23   Francesca Elsie CROME, MD  aspirin  EC 81 MG tablet Take 81 mg by mouth daily.    [provider]  atorvastatin  (LIPITOR ) 80 MG tablet TAKE 1 TABLET BY MOUTH EVERY DAY 11/15/23   Lavona Agent, MD  azithromycin  (ZITHROMAX ) 250 MG tablet Take 1 tablet (250 mg total) by mouth daily. Take first 2 tablets together, then 1 every day until finished. Patient not taking: Reported on 03/14/2024 10/01/23   Francesca Elsie CROME, MD  benzonatate  (TESSALON ) 100 MG capsule Take 1 capsule (100 mg total) by mouth every 8 (eight) hours. Patient not taking: Reported on 03/14/2024 02/23/23   Geroldine Berg, MD  calcium  carbonate (TUMS - DOSED IN MG ELEMENTAL CALCIUM ) 500 MG chewable tablet Chew 1 tablet by mouth as needed for indigestion or  heartburn.    [provider]  carvedilol  (COREG ) 25 MG tablet TAKE 1 TABLET BY MOUTH TWICE A DAY 05/24/23   Lavona Agent, MD  empagliflozin  (JARDIANCE ) 10 MG TABS tablet Take 1 tablet (10 mg total) by mouth daily before breakfast. 03/14/24   Lavona Agent, MD  ENTRESTO  97-103 MG TAKE 1 TABLET BY MOUTH TWICE A DAY 06/20/23   Lavona Agent, MD  furosemide  (LASIX ) 20 MG tablet Take 1 tablet (20 mg total) by mouth daily. Patient taking differently: Take 20 mg by mouth daily as needed. 01/10/20   Lavona Agent, MD  Multiple Vitamin (MULTIVITAMIN) tablet Take 1 tablet by mouth daily.    [provider]  nitroGLYCERIN  (NITROSTAT ) 0.4 MG SL tablet Place 1 tablet (0.4 mg total) under the tongue every 5 (five) minutes as needed for chest pain. 07/23/20   Lavona Agent, MD  ondansetron  (ZOFRAN -ODT) 4 MG disintegrating tablet Take 1 tablet (4 mg total) by mouth every 8 (eight) hours as needed for nausea or vomiting. Patient not taking: Reported on 03/14/2024 09/27/22   Bari Charmaine FALCON, MD  PROAIR  HFA 108 [phone] Base) MCG/ACT inhaler Inhale 2 puffs into the lungs as needed. 10/04/18   [provider]  spironolactone  (ALDACTONE ) 25 MG tablet Take 1 tablet (25 mg total) by mouth daily. 11/17/23   Lavona Agent, MD  tamsulosin  (FLOMAX ) 0.4 MG CAPS capsule Take 1 capsule (0.4 mg total) by mouth daily. 02/19/16  Watt Rush, MD    Allergies: Patient has no known allergies.    Review of Systems  Gastrointestinal:  Positive for vomiting.    Updated Vital Signs BP 135/79   Pulse 70   Temp 97.7 F (36.5 C) (Oral)   Resp 16   Ht 6' 2 (1.88 m)   Wt 86.2 kg   SpO2 96%   BMI 24.39 kg/m   Physical Exam Vitals and nursing note reviewed.  Constitutional:      General: He is not in acute distress.    Appearance: He is well-developed.  HENT:     Head: Normocephalic and atraumatic.  Eyes:     Conjunctiva/sclera: Conjunctivae normal.  Cardiovascular:     Rate and Rhythm:  Normal rate and regular rhythm.     Heart sounds: No murmur heard. Pulmonary:     Effort: Pulmonary effort is normal. No respiratory distress.     Breath sounds: Normal breath sounds.  Abdominal:     Palpations: Abdomen is soft.     Tenderness: There is no abdominal tenderness.  Musculoskeletal:        General: No swelling.     Cervical back: Neck supple.  Skin:    General: Skin is warm and dry.     Capillary Refill: Capillary refill takes less than 2 seconds.     Comments: Small scab left upper back from prior tick removal, no target lesion or cellulitis.  Neurological:     General: No focal deficit present.     Mental Status: He is alert and oriented to person, place, and time.     Sensory: No sensory deficit.     Motor: No weakness.     Gait: Gait normal.  Psychiatric:        Mood and Affect: Mood normal.     (all labs ordered are listed, but only abnormal results are displayed) Labs Reviewed  COMPREHENSIVE METABOLIC PANEL WITH GFR - Abnormal; Notable for the following components:      Result Value   Glucose, Bld 102 (*)    All other components within normal limits  CBC - Abnormal; Notable for the following components:   WBC 3.8 (*)    All other components within normal limits  URINALYSIS, ROUTINE W REFLEX MICROSCOPIC - Abnormal; Notable for the following components:   Color, Urine COLORLESS (*)    Glucose, UA >=500 (*)    All other components within normal limits  RESP PANEL BY RT-PCR (RSV, FLU A&B, COVID)  [id number]  LIPASE, BLOOD  TROPONIN I (HIGH SENSITIVITY)    EKG: EKG Interpretation Date/Time:  Sunday April 08 2024 13:24:13 EDT Ventricular Rate:  64 PR Interval:  205 QRS Duration:  103 QT Interval:  399 QTC Calculation: 412 R Axis:   -78  Text Interpretation: Sinus rhythm Left anterior fascicular block Probable anterior infarct, age indeterminate Comapred with prior EKG from 11/15/2023 Confirmed by Gennaro Bouchard ([id number]) on 04/08/2024 1:54:28  PM  Radiology: CT ABDOMEN PELVIS W CONTRAST Result Date: 04/08/2024 CLINICAL DATA:  Acute abdominal pain. Nausea vomiting. Recent tick bite. EXAM: CT ABDOMEN AND PELVIS WITH CONTRAST TECHNIQUE: Multidetector CT imaging of the abdomen and pelvis was performed using the standard protocol following bolus administration of intravenous contrast. RADIATION DOSE REDUCTION: This exam was performed according to the departmental dose-optimization program which includes automated exposure control, adjustment of the mA and/or kV according to patient size and/or use of iterative reconstruction technique. CONTRAST:  OMNIPAQUE  IOHEXOL  300 MG/ML  SOLN COMPARISON:  None Available. FINDINGS: Lower chest: Descending thoracic aortic atherosclerosis with right and circumflex coronary artery atherosclerosis. Thinning of the left ventricular myocardium at the apex may be incidental but could reflect a remote infarct (infarct favored given the mild fatty prominence along the luminal myocardium this vicinity) Mild scarring or atelectasis in the posterior basal segments of both lower lobes. Hepatobiliary: Foci of transient hepatic attenuation difference in the right hepatic lobe for example on image 16 series 2 and image 26 series 2, small underlying lesions cannot be readily excluded although no specific lesion is visible on the delayed images in these regions. Gallbladder unremarkable. Pancreas: Unremarkable Spleen: Unremarkable Adrenals/Urinary Tract: 1.2 cm benign cyst of the left mid kidney on image 12 series 6. No further imaging workup of this lesion is indicated. Stomach/Bowel: Unremarkable Vascular/Lymphatic: Atherosclerosis is present, including aortoiliac atherosclerotic disease. No pathologic adenopathy observed. 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 Other: No supplemental non-categorized findings. 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  Lumbar spondylosis and degenerative disc disease with suspected multilevel impingement. IMPRESSION: 1. A specific cause for the patient's acute abdominal pain is not identified. 2. 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 Atherosclerosis including coronary atherosclerosis. 3. 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 4. Lumbar spondylosis and degenerative disc disease with suspected multilevel impingement. 5. Foci of transient hepatic attenuation difference in the right hepatic lobe. While often simply a transient vascular phenomenon, this can be associated underlying benign or malignant lesions. Hepatic protocol MRI with and without contrast should be considered more definitive assessment, particularly if the patient has a history of gastrointestinal malignancy or abnormal liver enzymes. 6.  Aortic Atherosclerosis (ICD10-I70.0). Electronically Signed   By: Ryan Salvage M.D.   On: 04/08/2024 15:44     Procedures   Medications Ordered in the ED  ondansetron  (ZOFRAN ) injection 4 mg (4 mg Intravenous Patient Refused/Not Given 04/08/24 1335)  iohexol  (OMNIPAQUE ) 300 MG/ML solution 100 mL (100 mLs Intravenous Contrast Given 04/08/24 1514)  doxycycline  (VIBRA -TABS) tablet 100 mg (100 mg Oral Given 04/08/24 1737)                                    Medical Decision Making Differential diagnosis includes but not limited to gastritis, GERD, bowel obstruction, gastroenteritis, viral illness, Lyme disease, ACS, recommend spotted fever, other  ED course: Patient came in for evaluation of nausea and vomiting for the past several days.  He is able to tolerate p.o. at night and denies nausea or vomiting at this time.  He was worried because of the vomiting and he had a tick bite several days ago, unsure how long it was attached he pulled off it his symptoms started right after he pulled the tick off.  Given his age and some mild abdominal distention on my exam I  ordered a CT to rule out obstruction or possible intra-abdominal process causing his vomiting.  This did not show any acute findings, did show incidental findings that were gone over in detail with the patient.  There were some ill-defined liver lesions and patient was advised on outpatient follow-up for possible MRI, also appearance of possible LV infarct.  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  Workup overall very reassuring, patient  is requesting to go home I discussed with him we will start him on doxycycline  for possible borne illness  Amount and/or Complexity of Data Reviewed Labs: ordered. Radiology: ordered.  Risk Prescription drug management.        Final diagnoses:  Nausea and vomiting, unspecified vomiting type  Tick bite of left back wall of thorax, initial encounter    ED Discharge Orders          Ordered    doxycycline  (VIBRAMYCIN ) 100 MG capsule  2 times daily        04/08/24 1733    ondansetron  (ZOFRAN -ODT) 4 MG disintegrating tablet  Every 6 hours PRN        04/08/24 1733               Suellen Sherran LABOR, PA-C 04/08/24 1918    Gennaro Bouchard L, DO 04/11/24 0725

## 2024-04-17 DIAGNOSIS — R935 Abnormal findings on diagnostic imaging of other abdominal regions, including retroperitoneum: Secondary | ICD-10-CM | POA: Diagnosis not present

## 2024-04-17 DIAGNOSIS — Z6824 Body mass index (BMI) 24.0-24.9, adult: Secondary | ICD-10-CM | POA: Diagnosis not present

## 2024-04-17 DIAGNOSIS — I5032 Chronic diastolic (congestive) heart failure: Secondary | ICD-10-CM | POA: Diagnosis not present

## 2024-04-17 DIAGNOSIS — I110 Hypertensive heart disease with heart failure: Secondary | ICD-10-CM | POA: Diagnosis not present

## 2024-04-17 DIAGNOSIS — W57XXXA Bitten or stung by nonvenomous insect and other nonvenomous arthropods, initial encounter: Secondary | ICD-10-CM | POA: Diagnosis not present

## 2024-04-17 DIAGNOSIS — I10 Essential (primary) hypertension: Secondary | ICD-10-CM | POA: Diagnosis not present

## 2024-04-18 ENCOUNTER — Other Ambulatory Visit (HOSPITAL_COMMUNITY): Payer: Self-pay | Admitting: Internal Medicine

## 2024-04-18 DIAGNOSIS — R935 Abnormal findings on diagnostic imaging of other abdominal regions, including retroperitoneum: Secondary | ICD-10-CM

## 2024-04-21 ENCOUNTER — Ambulatory Visit (HOSPITAL_COMMUNITY)
Admission: RE | Admit: 2024-04-21 | Discharge: 2024-04-21 | Disposition: A | Discharge status: Home / Self Care | Source: Ambulatory Visit | Attending: Internal Medicine | Admitting: Internal Medicine

## 2024-04-21 DIAGNOSIS — N281 Cyst of kidney, acquired: Secondary | ICD-10-CM | POA: Diagnosis not present

## 2024-04-21 DIAGNOSIS — I700 Atherosclerosis of aorta: Secondary | ICD-10-CM | POA: Diagnosis not present

## 2024-04-21 DIAGNOSIS — R935 Abnormal findings on diagnostic imaging of other abdominal regions, including retroperitoneum: Secondary | ICD-10-CM | POA: Insufficient documentation

## 2024-04-21 DIAGNOSIS — D1803 Hemangioma of intra-abdominal structures: Secondary | ICD-10-CM | POA: Diagnosis not present

## 2024-04-21 MED ORDER — GADOBUTROL 1 MMOL/ML IV SOLN
8.0000 mL | Freq: Once | INTRAVENOUS | Status: AC | PRN
  Administered 2024-04-21: 8 mL via INTRAVENOUS

## 2024-05-08 ENCOUNTER — Other Ambulatory Visit: Payer: Self-pay | Admitting: Cardiology

## 2024-05-08 DIAGNOSIS — K219 Gastro-esophageal reflux disease without esophagitis: Secondary | ICD-10-CM | POA: Diagnosis not present

## 2024-05-08 DIAGNOSIS — I110 Hypertensive heart disease with heart failure: Secondary | ICD-10-CM | POA: Diagnosis not present

## 2024-05-08 DIAGNOSIS — I444 Left anterior fascicular block: Secondary | ICD-10-CM | POA: Diagnosis not present

## 2024-05-08 DIAGNOSIS — Z79899 Other long term (current) drug therapy: Secondary | ICD-10-CM | POA: Diagnosis not present

## 2024-05-08 DIAGNOSIS — Z7982 Long term (current) use of aspirin: Secondary | ICD-10-CM | POA: Diagnosis not present

## 2024-05-08 DIAGNOSIS — E785 Hyperlipidemia, unspecified: Secondary | ICD-10-CM | POA: Diagnosis not present

## 2024-05-08 DIAGNOSIS — Z87891 Personal history of nicotine dependence: Secondary | ICD-10-CM | POA: Diagnosis not present

## 2024-05-08 DIAGNOSIS — I509 Heart failure, unspecified: Secondary | ICD-10-CM | POA: Diagnosis not present

## 2024-05-08 DIAGNOSIS — R918 Other nonspecific abnormal finding of lung field: Secondary | ICD-10-CM | POA: Diagnosis not present

## 2024-05-08 DIAGNOSIS — I771 Stricture of artery: Secondary | ICD-10-CM | POA: Diagnosis not present

## 2024-05-08 DIAGNOSIS — R079 Chest pain, unspecified: Secondary | ICD-10-CM | POA: Diagnosis not present

## 2024-05-24 ENCOUNTER — Other Ambulatory Visit: Payer: Self-pay | Admitting: Cardiology

## 2024-08-22 DIAGNOSIS — Z8546 Personal history of malignant neoplasm of prostate: Secondary | ICD-10-CM | POA: Diagnosis not present

## 2024-08-29 DIAGNOSIS — Z8546 Personal history of malignant neoplasm of prostate: Secondary | ICD-10-CM | POA: Diagnosis not present

## 2024-08-29 DIAGNOSIS — N401 Enlarged prostate with lower urinary tract symptoms: Secondary | ICD-10-CM | POA: Diagnosis not present

## 2024-08-29 DIAGNOSIS — R3912 Poor urinary stream: Secondary | ICD-10-CM | POA: Diagnosis not present

## 2024-08-29 DIAGNOSIS — R351 Nocturia: Secondary | ICD-10-CM | POA: Diagnosis not present

## 2024-09-26 ENCOUNTER — Other Ambulatory Visit (HOSPITAL_COMMUNITY): Payer: Self-pay | Admitting: Family Medicine

## 2024-09-26 ENCOUNTER — Encounter (HOSPITAL_COMMUNITY): Payer: Self-pay | Admitting: Family Medicine

## 2024-09-26 DIAGNOSIS — I2510 Atherosclerotic heart disease of native coronary artery without angina pectoris: Secondary | ICD-10-CM

## 2024-10-02 ENCOUNTER — Encounter (INDEPENDENT_AMBULATORY_CARE_PROVIDER_SITE_OTHER): Payer: Self-pay | Admitting: *Deleted

## 2024-10-25 ENCOUNTER — Encounter: Payer: Self-pay | Admitting: Internal Medicine

## 2024-10-25 ENCOUNTER — Encounter: Payer: Self-pay | Admitting: Gastroenterology

## 2024-11-27 ENCOUNTER — Ambulatory Visit: Admitting: Gastroenterology
